# Patient Record
Sex: Male | Born: 1955
Health system: Southern US, Community
[De-identification: ages and names within clinical notes are randomized; demographics above are authoritative.]

## PROBLEM LIST (undated history)

## (undated) DIAGNOSIS — I499 Cardiac arrhythmia, unspecified: Secondary | ICD-10-CM

## (undated) DIAGNOSIS — I10 Essential (primary) hypertension: Secondary | ICD-10-CM

## (undated) DIAGNOSIS — G4733 Obstructive sleep apnea (adult) (pediatric): Secondary | ICD-10-CM

## (undated) DIAGNOSIS — Z7982 Long term (current) use of aspirin: Secondary | ICD-10-CM

## (undated) DIAGNOSIS — N138 Other obstructive and reflux uropathy: Secondary | ICD-10-CM

## (undated) DIAGNOSIS — N401 Enlarged prostate with lower urinary tract symptoms: Secondary | ICD-10-CM

## (undated) DIAGNOSIS — M109 Gout, unspecified: Secondary | ICD-10-CM

## (undated) DIAGNOSIS — M1A09X0 Idiopathic chronic gout, multiple sites, without tophus (tophi): Secondary | ICD-10-CM

## (undated) DIAGNOSIS — I480 Paroxysmal atrial fibrillation: Secondary | ICD-10-CM

## (undated) DIAGNOSIS — R351 Nocturia: Secondary | ICD-10-CM

## (undated) HISTORY — DX: Obstructive sleep apnea (adult) (pediatric): G47.33

## (undated) HISTORY — DX: Essential (primary) hypertension: I10

## (undated) HISTORY — DX: Other obstructive and reflux uropathy: N13.8

## (undated) HISTORY — DX: Idiopathic chronic gout, multiple sites, without tophus (tophi): M1A.09X0

## (undated) HISTORY — DX: Paroxysmal atrial fibrillation: I48.0

## (undated) HISTORY — DX: Benign prostatic hyperplasia with lower urinary tract symptoms: N40.1

## (undated) HISTORY — DX: Gout, unspecified: M10.9

## (undated) HISTORY — DX: Long term (current) use of aspirin: Z79.82

## (undated) HISTORY — DX: Nocturia: R35.1

## (undated) HISTORY — PX: LIPOSUCTION: SHX10

---

## 2017-03-27 DIAGNOSIS — R079 Chest pain, unspecified: Secondary | ICD-10-CM | POA: Insufficient documentation

## 2018-03-09 ENCOUNTER — Ambulatory Visit: Payer: 59 | Attending: Family Medicine | Admitting: Family Medicine

## 2018-03-09 ENCOUNTER — Encounter: Payer: Self-pay | Admitting: Family Medicine

## 2018-03-09 VITALS — BP 145/88 | HR 68 | Temp 98.1°F | Resp 18 | Ht 74.0 in | Wt 232.0 lb

## 2018-03-09 DIAGNOSIS — M1A09X0 Idiopathic chronic gout, multiple sites, without tophus (tophi): Secondary | ICD-10-CM

## 2018-03-09 DIAGNOSIS — N401 Enlarged prostate with lower urinary tract symptoms: Secondary | ICD-10-CM

## 2018-03-09 DIAGNOSIS — I480 Paroxysmal atrial fibrillation: Secondary | ICD-10-CM

## 2018-03-09 DIAGNOSIS — R350 Frequency of micturition: Secondary | ICD-10-CM

## 2018-03-09 DIAGNOSIS — H04123 Dry eye syndrome of bilateral lacrimal glands: Secondary | ICD-10-CM

## 2018-03-09 DIAGNOSIS — R82998 Other abnormal findings in urine: Secondary | ICD-10-CM

## 2018-03-09 DIAGNOSIS — R351 Nocturia: Secondary | ICD-10-CM | POA: Diagnosis not present

## 2018-03-09 DIAGNOSIS — Z7982 Long term (current) use of aspirin: Secondary | ICD-10-CM

## 2018-03-09 DIAGNOSIS — I10 Essential (primary) hypertension: Secondary | ICD-10-CM | POA: Diagnosis not present

## 2018-03-09 LAB — POCT URINALYSIS DIP (CLINITEK)
Bilirubin, UA: NEGATIVE
Blood, UA: NEGATIVE
Glucose, UA: NEGATIVE mg/dL
Ketones, POC UA: NEGATIVE mg/dL
Nitrite, UA: NEGATIVE
Spec Grav, UA: 1.02
Urobilinogen, UA: 0.2 U/dL
pH, UA: 5.5

## 2018-03-09 MED ORDER — ALLOPURINOL 300 MG PO TABS: 300.0000 mg | ORAL_TABLET | Freq: Every day | ORAL | 1 refills | Status: DC

## 2018-03-09 MED ORDER — VALSARTAN 320 MG PO TABS: 320.0000 mg | ORAL_TABLET | Freq: Every day | ORAL | 1 refills | Status: DC

## 2018-03-09 MED ORDER — SULFAMETHOXAZOLE-TRIMETHOPRIM 800-160 MG PO TABS: 1.0000 | ORAL_TABLET | Freq: Two times a day (BID) | ORAL | 0 refills | Status: AC

## 2018-03-09 MED ORDER — COLCHICINE 0.6 MG PO TABS: 0.6000 mg | ORAL_TABLET | Freq: Every day | ORAL | 1 refills | Status: DC

## 2018-03-09 MED ORDER — DILTIAZEM HCL ER COATED BEADS 300 MG PO CP24: 300.0000 mg | ORAL_CAPSULE | Freq: Every day | ORAL | 1 refills | Status: DC

## 2018-03-09 MED ORDER — METOPROLOL SUCCINATE ER 50 MG PO TB24: 50.0000 mg | ORAL_TABLET | Freq: Every day | ORAL | 1 refills | Status: DC

## 2018-03-09 MED ORDER — ASPIRIN 325 MG PO TABS: 325.0000 mg | ORAL_TABLET | Freq: Every day | ORAL | 1 refills | Status: DC

## 2018-03-09 MED ORDER — FLECAINIDE ACETATE 50 MG PO TABS: 50.0000 mg | ORAL_TABLET | Freq: Every day | ORAL | 1 refills | Status: DC

## 2018-03-09 MED ORDER — TAMSULOSIN HCL 0.4 MG PO CAPS: 0.4000 mg | ORAL_CAPSULE | Freq: Every day | ORAL | 1 refills | Status: DC

## 2018-03-09 MED FILL — TAMSULOSIN HCL 0.4 MG CAP: 0.4 | 30 days supply | Qty: 30 | Fill #0

## 2018-03-09 MED FILL — METOPROLOL SUCCINATE ER 50: 50 | 30 days supply | Qty: 30 | Fill #0

## 2018-03-09 MED FILL — ALLOPURINOL 300 MG TAB: 300 | 30 days supply | Qty: 30 | Fill #0

## 2018-03-09 MED FILL — FLECAINIDE ACETATE 50 MG TA: 50 | 30 days supply | Qty: 30 | Fill #0

## 2018-03-09 MED FILL — DIOVAN 320 MG TABLET: 320 | 30 days supply | Qty: 30 | Fill #0

## 2018-03-09 MED FILL — CARTIA XT 300 MG CAPSULE SA: 300 | 30 days supply | Qty: 30 | Fill #0

## 2018-03-09 MED FILL — SULFAMETHOXAZOLE-TMP DS TAB: 800-160 | 7 days supply | Qty: 14 | Fill #0

## 2018-03-09 NOTE — Progress Notes (Signed)
Subjective:    Patient ID: Albert Bon., male    DOB: May 28, 1955, 63 y.o.   MRN: [id number]  HPI       63 yo male who is new to the practice. Patient reports medical problems include hypertension, atrial fibrillation for which he is on ASA 325 mg-previously on Eliquis, and gout which started in the left great toe and is now in Achilles tendon, knees and left hands at times, BPH with urinary obstructive symptoms.  Patient reports that he moved to the area in January of this year.  Patient needs to reestablish with primary care and other specialists.      Patient reports that he is compliant with his blood pressure medication and has had no issues with headaches or dizziness related to his blood pressure.  He denies any chest pain, shortness of breath or palpitations related to his atrial fibrillation.  Patient has had no episodes of feeling as if he has had increased heart rate.  Patient is currently on aspirin 325 mg daily.  Patient denies any abdominal pain and has had no unusual bruising or bleeding.  Patient reports no nosebleeds, no bleeding with brushing his teeth, no blood in the urine and no rectal bleeding or blood in the stools.      He reports a history of enlarged prostate for which he is on Flomax.  Patient also reports prior testosterone therapy secondary to low testosterone level and patient states that he would like to restart testosterone as he felt that he had more energy when he was on testosterone supplementation.  He has noticed a recent increase in frequency of urination including having to urinate 3-4 times at night after he is gone to bed.  He is not quite sure how long the increase in urinary frequency has been going on but he has noticed it for at least 4 weeks.  Patient with some occasional dry mouth, increased thirst but he believes that this may be secondary to his medications and patient was also told to increase water to help with his gout.  Patient has also noticed  some changes in vision as his eyes constantly feel dry and sometimes itchy.  Patient denies blurred vision or double vision.      He denies any recent acute gout flareups.  He takes allopurinol and colchicine daily.  Patient denies any stomach upset or diarrhea associated with his use of these medications.  Patient tends to get gout in his feet at the base the great toes, left greater than right, knees, Achilles tendon areas and now in his left hand.  He knows which foods and beverages he should avoid but still occasionally drinks beer even though this is 1 of the things that can trigger his gout flareups.  Overall, patient feels that he is doing well at this time.  Past Medical History:  Diagnosis Date  . BPH with obstruction/lower urinary tract symptoms   . Essential hypertension   . Gout   . Long-term use of aspirin therapy   . OSA (obstructive sleep apnea)   . Paroxysmal atrial fibrillation [facility])    Past Surgical History:  Procedure Laterality Date  . LIPOSUCTION     Family History  Problem Relation Age of Onset  . Diabetes Mother   . Hypertension Father   . Diabetes Sister   . Diabetes Brother    Social History   Tobacco Use  . Smoking status: Former Research scientist (life sciences)  . Smokeless tobacco: Current User  Types: Chew  Substance Use Topics  . Alcohol use: Not Currently  . Drug use: Not Currently   Allergies  Allergen Reactions  . Other Hives    HAIR DYE      Review of Systems  Constitutional: Positive for fatigue (occasional ). Negative for chills and fever.  HENT: Negative for ear pain, hearing loss, nosebleeds, postnasal drip, rhinorrhea, sinus pressure, sinus pain, sore throat and trouble swallowing.   Eyes: Positive for itching and visual disturbance. Negative for photophobia, pain, discharge and redness.  Respiratory: Negative for cough and shortness of breath.   Cardiovascular: Negative for chest pain, palpitations and leg swelling.  Gastrointestinal: Negative for  abdominal pain, blood in stool, constipation, diarrhea and nausea.  Endocrine: Positive for polydipsia. Negative for polyphagia and polyuria.  Genitourinary: Positive for flank pain and frequency. Negative for dysuria.  Musculoskeletal: Positive for arthralgias (occasionally due to gout). Negative for back pain, gait problem, joint swelling, myalgias, neck pain and neck stiffness.  Neurological: Negative for dizziness and headaches.  Hematological: Negative for adenopathy. Does not bruise/bleed easily.  Psychiatric/Behavioral: Negative for sleep disturbance. The patient is not nervous/anxious.        Objective:   Physical Exam Vitals signs and nursing note reviewed.  Constitutional:      General: He is not in acute distress.    Appearance: Normal appearance.  HENT:     Head: Normocephalic and atraumatic.     Right Ear: Tympanic membrane, ear canal and external ear normal.     Left Ear: Tympanic membrane, ear canal and external ear normal.     Nose: Nose normal. No rhinorrhea.     Mouth/Throat:     Mouth: Mucous membranes are moist.     Pharynx: Oropharynx is clear. Posterior oropharyngeal erythema (mild) present. No oropharyngeal exudate.  Eyes:     Extraocular Movements: Extraocular movements intact.     Conjunctiva/sclera: Conjunctivae normal.     Pupils: Pupils are equal, round, and reactive to light.  Neck:     Musculoskeletal: Normal range of motion and neck supple. No neck rigidity or muscular tenderness.     Vascular: No carotid bruit.  Cardiovascular:     Rate and Rhythm: Normal rate and regular rhythm.  Pulmonary:     Effort: Pulmonary effort is normal.     Breath sounds: Normal breath sounds.  Abdominal:     General: Bowel sounds are normal. There is no distension.     Palpations: Abdomen is soft.     Tenderness: There is no abdominal tenderness. There is no right CVA tenderness or left CVA tenderness.  Musculoskeletal: Normal range of motion.        General: No  tenderness.     Right lower leg: No edema.     Left lower leg: No edema.     Comments: No acute gouty tophi  Lymphadenopathy:     Cervical: No cervical adenopathy.  Neurological:     General: No focal deficit present.     Mental Status: He is alert and oriented to person, place, and time.  Psychiatric:        Mood and Affect: Mood normal.        Behavior: Behavior normal.        Thought Content: Thought content normal.        Judgment: Judgment normal.    BP (!) 145/88 (BP Location: Left Arm, Patient Position: Sitting, Cuff Size: Large)   Pulse 68   Temp 98.1 F (36.7 C) (  Oral)   Resp 18   Ht 6\' 2"  (1.88 m)   Wt 232 lb (105.2 kg)   SpO2 100%   BMI 29.79 kg/m  nurse's notes and vital signs reviewed        Assessment & Plan:  1. Paroxysmal atrial fibrillation ([place]) Patient is new to the area and reports a history of paroxysmal atrial fibrillation.  Patient is provided with refills of his current flecainide and diltiazem as well as aspirin 325 mg.  Patient will be referred to establish with a cardiologist.  Patient will have BMP and CBC in follow-up of medication use for control of his paroxysmal atrial fibrillation and long-term use of aspirin. - flecainide (TAMBOCOR) 50 MG tablet; Take 1 tablet (50 mg total) by mouth daily.  Dispense: 90 tablet; Refill: 1 - diltiazem (CARTIA XT) 300 MG 24 hr capsule; Take 1 capsule (300 mg total) by mouth daily.  Dispense: 90 capsule; Refill: 1 - aspirin 325 MG tablet; Take 1 tablet (325 mg total) by mouth daily.  Dispense: 90 tablet; Refill: 1 - Ambulatory referral to Cardiology - Basic Metabolic Panel - CBC with Differential  2. Essential hypertension Patient's blood pressure was slightly above normal at today's visit.  Patient is encouraged to follow a Dash diet and begin regular low impact cardiovascular exercise.  Prescription provided for Diovan, diltiazem and metoprolol for continued treatment of hypertension.  Patient will also have  BMP in follow-up of medication use and lipid panel due to his hypertension. - valsartan (DIOVAN) 320 MG tablet; Take 1 tablet (320 mg total) by mouth daily. To control blood pressure  Dispense: 90 tablet; Refill: 1 - metoprolol succinate (TOPROL-XL) 50 MG 24 hr tablet; Take 1 tablet (50 mg total) by mouth daily. Take with or immediately following a meal.  Dispense: 90 tablet; Refill: 1 - Basic Metabolic Panel - Lipid Panel  3. BPH associated with nocturia Patient with BPH associated with nocturia/urinary frequency per patient's report.  Patient provided with refill of his Flomax and patient will have urinalysis at today's visit.  Urinalysis showed small leukocytes and urine will be sent for culture.  Patient prescribed Bactrim DS in the interim.  Referral also placed to urology in follow-up of patient's BPH.  Patient stated that he had been on testosterone in the past and requested testosterone at today's visit.  Patient did not have any proof of prior testosterone use and I discussed with the patient that testosterone can increase risk of prostate cancer and patient was asked to follow-up with urology regarding his wish to be on testosterone therapy as well as patient with complaints of increased urinary frequency. - tamsulosin (FLOMAX) 0.4 MG CAPS capsule; Take 1 capsule (0.4 mg total) by mouth at bedtime.  Dispense: 90 capsule; Refill: 1 - Ambulatory referral to Urology - POCT URINALYSIS DIP (CLINITEK) - Urine Culture  4. Chronic gout of multiple sites, unspecified cause Patient reports chronic gout at multiple joints but does not have any current acute flareups.  Refills provided of allopurinol and colchicine and patient will have BMP to check renal function and uric acid level to see if his current therapy is effective.  Patient is encouraged to remain hydrated and to avoid foods/beverages which tend to trigger his gout symptoms. - allopurinol (ZYLOPRIM) 300 MG tablet; Take 1 tablet (300 mg  total) by mouth daily. To treat gout  Dispense: 90 tablet; Refill: 1 - colchicine 0.6 MG tablet; Take 1 tablet (0.6 mg total) by mouth daily. To treat gout  Dispense: 90 tablet; Refill: 1 - Basic Metabolic Panel - Uric Acid  5. Urinary frequency Patient with complaint of recent onset of urinary frequency.  Patient is being referred to urology for further evaluation as patient also reports a history of BPH as well as low testosterone.  Patient will also have glucose level done as part of BMP.  Patient will have urinalysis at today's visit in follow-up.  Patient's urinalysis showed leukocytes and urine will be sent for culture. - Ambulatory referral to Urology - POCT URINALYSIS DIP (CLINITEK) - Urine Culture  6. Dry eyes Patient with complaint of a change in vision including recent issues with dry eyes.  Patient will be referred to ophthalmology for further evaluation - Ambulatory referral to Ophthalmology  7. Long-term use of aspirin therapy Patient with long-term use of aspirin therapy due to his atrial fibrillation.  Patient will have BMP to check renal function as well as CBC to look for anemia or platelet disorder related to aspirin use. - Basic Metabolic Panel - CBC with Differential  8. Leukocytes in urine Patient with leukocytes in the urine and complaint of recent increase in urinary frequency.  Urine will be sent for culture and in the interim patient has been placed on Septra double strength twice daily x7 days for treatment of acute cystitis. - sulfamethoxazole-trimethoprim (BACTRIM DS,SEPTRA DS) 800-160 MG tablet; Take 1 tablet by mouth 2 (two) times daily for 7 days.  Dispense: 14 tablet; Refill: 0 - Urine Culture  An After Visit Summary was printed and given to the patient. Allergies as of 03/09/2018      Reactions   Other Hives   HAIR DYE      Medication List       Accurate as of March 09, 2018 11:59 PM. Always use your most recent med list.        allopurinol  300 MG tablet Commonly known as:  ZYLOPRIM Take 1 tablet (300 mg total) by mouth daily. To treat gout   aspirin 325 MG tablet Take 1 tablet (325 mg total) by mouth daily.   colchicine 0.6 MG tablet Take 1 tablet (0.6 mg total) by mouth daily. To treat gout   diltiazem 300 MG 24 hr capsule Commonly known as:  CARTIA XT Take 1 capsule (300 mg total) by mouth daily.   flecainide 50 MG tablet Commonly known as:  TAMBOCOR Take 1 tablet (50 mg total) by mouth daily.   metoprolol succinate 50 MG 24 hr tablet Commonly known as:  TOPROL-XL Take 1 tablet (50 mg total) by mouth daily. Take with or immediately following a meal.   sulfamethoxazole-trimethoprim 800-160 MG tablet Commonly known as:  BACTRIM DS,SEPTRA DS Take 1 tablet by mouth 2 (two) times daily for 7 days.   tamsulosin 0.4 MG Caps capsule Commonly known as:  FLOMAX Take 1 capsule (0.4 mg total) by mouth at bedtime.   valsartan 320 MG tablet Commonly known as:  DIOVAN Take 1 tablet (320 mg total) by mouth daily. To control blood pressure      Return in about 4 months (around 07/08/2018) for HTN/Afib.

## 2018-03-10 LAB — CBC WITH DIFFERENTIAL/PLATELET
Basophils Absolute: 0.1 x10E3/uL (ref 0.0–0.2)
Basos: 1 %
EOS (ABSOLUTE): 0.2 x10E3/uL (ref 0.0–0.4)
Eos: 4 %
Hematocrit: 45.9 % (ref 37.5–51.0)
Hemoglobin: 15 g/dL (ref 13.0–17.7)
Immature Grans (Abs): 0 x10E3/uL (ref 0.0–0.1)
Immature Granulocytes: 0 %
Lymphocytes Absolute: 2.5 x10E3/uL (ref 0.7–3.1)
Lymphs: 44 %
MCH: 27.2 pg (ref 26.6–33.0)
MCHC: 32.7 g/dL (ref 31.5–35.7)
MCV: 83 fL (ref 79–97)
Monocytes Absolute: 0.5 x10E3/uL (ref 0.1–0.9)
Monocytes: 9 %
Neutrophils Absolute: 2.4 x10E3/uL (ref 1.4–7.0)
Neutrophils: 42 %
Platelets: 298 x10E3/uL (ref 150–450)
RBC: 5.51 x10E6/uL (ref 4.14–5.80)
RDW: 14 % (ref 11.6–15.4)
WBC: 5.8 x10E3/uL (ref 3.4–10.8)

## 2018-03-10 LAB — LIPID PANEL
Chol/HDL Ratio: 2.9 ratio (ref 0.0–5.0)
Cholesterol, Total: 183 mg/dL (ref 100–199)
HDL: 64 mg/dL
LDL Calculated: 104 mg/dL — ABNORMAL HIGH (ref 0–99)
Triglycerides: 74 mg/dL (ref 0–149)
VLDL Cholesterol Cal: 15 mg/dL (ref 5–40)

## 2018-03-10 LAB — BASIC METABOLIC PANEL WITH GFR
BUN/Creatinine Ratio: 10 (ref 10–24)
BUN: 11 mg/dL (ref 8–27)
CO2: 23 mmol/L (ref 20–29)
Calcium: 9.3 mg/dL (ref 8.6–10.2)
Chloride: 106 mmol/L (ref 96–106)
Creatinine, Ser: 1.08 mg/dL (ref 0.76–1.27)
GFR calc Af Amer: 83 mL/min/1.73
GFR calc non Af Amer: 72 mL/min/1.73
Glucose: 97 mg/dL (ref 65–99)
Potassium: 4.5 mmol/L (ref 3.5–5.2)
Sodium: 144 mmol/L (ref 134–144)

## 2018-03-10 LAB — URIC ACID: Uric Acid: 4 mg/dL (ref 3.7–8.6)

## 2018-03-11 LAB — URINE CULTURE: Organism ID, Bacteria: NO GROWTH

## 2018-03-12 ENCOUNTER — Encounter: Payer: Self-pay | Admitting: Family Medicine

## 2018-03-12 DIAGNOSIS — Z7982 Long term (current) use of aspirin: Secondary | ICD-10-CM | POA: Insufficient documentation

## 2018-03-12 DIAGNOSIS — M1A09X0 Idiopathic chronic gout, multiple sites, without tophus (tophi): Secondary | ICD-10-CM

## 2018-03-12 DIAGNOSIS — N401 Enlarged prostate with lower urinary tract symptoms: Secondary | ICD-10-CM

## 2018-03-12 DIAGNOSIS — R351 Nocturia: Secondary | ICD-10-CM

## 2018-03-12 DIAGNOSIS — I480 Paroxysmal atrial fibrillation: Secondary | ICD-10-CM | POA: Insufficient documentation

## 2018-03-12 DIAGNOSIS — I10 Essential (primary) hypertension: Secondary | ICD-10-CM | POA: Insufficient documentation

## 2018-03-12 HISTORY — DX: Nocturia: R35.1

## 2018-03-12 HISTORY — DX: Idiopathic chronic gout, multiple sites, without tophus (tophi): M1A.09X0

## 2018-03-12 HISTORY — DX: Long term (current) use of aspirin: Z79.82

## 2018-03-12 HISTORY — DX: Benign prostatic hyperplasia with lower urinary tract symptoms: N40.1

## 2018-03-14 ENCOUNTER — Telehealth: Payer: Self-pay | Admitting: *Deleted

## 2018-03-14 NOTE — Telephone Encounter (Signed)
Patient verified DOB Patient is aware of no growth being noted on urine culture. Patient was provided the contact information for urology and ophthalmology. No further questions.

## 2018-03-14 NOTE — Telephone Encounter (Signed)
-----   Message from Antony Blackbird, MD sent at 03/12/2018 11:44 AM EST ----- Please notify patient that his urine culture showed no growth of bacteria. Please keep follow-up appointment with GI

## 2018-03-22 ENCOUNTER — Encounter: Payer: Self-pay | Admitting: *Deleted

## 2018-03-29 ENCOUNTER — Telehealth: Payer: Self-pay | Admitting: Cardiology

## 2018-03-29 NOTE — Telephone Encounter (Signed)
Called patient to reschedule his appointment for 03/30/2018 due to the coronavirus pandemic.  Patient is referred to establish new cardiac care as he has moved here from another state in January.  He has a history of paroxysmal atrial fibrillation and was followed by a cardiologist at his last place of residence.  In talking to him today he is doing fine and not having any problems.  He is fine with being re-scheduled to a later date.  Please reschedule his appointment for at least 2 months out.

## 2018-03-30 ENCOUNTER — Ambulatory Visit: Payer: 59 | Admitting: Cardiology

## 2018-04-12 MED FILL — FLECAINIDE ACETATE 50 MG TA: 50 | 30 days supply | Qty: 30 | Fill #1

## 2018-04-12 MED FILL — ALLOPURINOL 300 MG TAB: 300 | 30 days supply | Qty: 30 | Fill #1

## 2018-04-12 MED FILL — COLCHICINE 0.6 MG TABS: 0.6 | 30 days supply | Qty: 30 | Fill #0

## 2018-04-12 MED FILL — METOPROLOL SUCCINATE ER 50: 50 | 30 days supply | Qty: 30 | Fill #1

## 2018-04-12 MED FILL — CARTIA XT 300 MG CAPSULE SA: 300 | 30 days supply | Qty: 30 | Fill #1

## 2018-04-12 MED FILL — TAMSULOSIN HCL 0.4 MG CAP: 0.4 | 30 days supply | Qty: 30 | Fill #1

## 2018-04-19 ENCOUNTER — Other Ambulatory Visit: Payer: Self-pay | Admitting: Pharmacist

## 2018-04-19 DIAGNOSIS — I10 Essential (primary) hypertension: Secondary | ICD-10-CM

## 2018-04-19 MED ORDER — VALSARTAN 320 MG PO TABS: 320.0000 mg | ORAL_TABLET | Freq: Every day | ORAL | 0 refills | Status: DC

## 2018-04-20 MED FILL — VALSARTAN 320 MG TAB: 320 | 30 days supply | Qty: 30 | Fill #0

## 2018-05-04 ENCOUNTER — Telehealth: Payer: Self-pay

## 2018-05-04 NOTE — Telephone Encounter (Signed)
Virtual Visit Pre-Appointment Phone Call  "(Name), I am calling you today to discuss your upcoming appointment. We are currently trying to limit exposure to the virus that causes COVID-19 by seeing patients at home rather than in the office."  1. "What is the BEST phone number to call the Good of the visit?" - include this in appointment notes  2. "Do you have or have access to (through a family member/friend) a smartphone with video capability that we can use for your visit?" a. If yes - list this number in appt notes as "cell" (if different from BEST phone #) and list the appointment type as a VIDEO visit in appointment notes b. If no - list the appointment type as a PHONE visit in appointment notes  3. Confirm consent - "In the setting of the current Covid19 crisis, you are scheduled for a (phone or video) visit with your provider on (date) at (time).  Just as we do with many in-office visits, in order for you to participate in this visit, we must obtain consent.  If you'd like, I can send this to your mychart (if signed up) or email for you to review.  Otherwise, I can obtain your verbal consent now.  All virtual visits are billed to your insurance company just like a normal visit would be.  By agreeing to a virtual visit, we'd like you to understand that the technology does not allow for your provider to perform an examination, and thus may limit your provider's ability to fully assess your condition. If your provider identifies any concerns that need to be evaluated in person, we will make arrangements to do so.  Finally, though the technology is pretty good, we cannot assure that it will always work on either your or our end, and in the setting of a video visit, we may have to convert it to a phone-only visit.  In either situation, we cannot ensure that we have a secure connection.  Are you willing to proceed?" STAFF: Did the patient verbally acknowledge consent to telehealth visit? Document  YES/NO here: YES  4. Advise patient to be prepared - "Two hours prior to your appointment, go ahead and check your blood pressure, pulse, oxygen saturation, and your weight (if you have the equipment to check those) and write them all down. When your visit starts, your provider will ask you for this information. If you have an Apple Watch or Kardia device, please plan to have heart rate information ready on the Good of your appointment. Please have a pen and paper handy nearby the Good of the visit as well."  5. Give patient instructions for MyChart download to smartphone OR Doximity/Doxy.me as below if video visit (depending on what platform provider is using)  6. Inform patient they will receive a phone call 15 minutes prior to their appointment time (may be from unknown caller ID) so they should be prepared to answer    Albert Good. has been deemed a candidate for a follow-up tele-health visit to limit community exposure during the Covid-19 pandemic. I spoke with the patient via phone to ensure availability of phone/video source, confirm preferred email & phone number, and discuss instructions and expectations.  I reminded Albert Good. to be prepared with any vital sign and/or heart rhythm information that could potentially be obtained via home monitoring, at the time of his visit. I reminded Albert Good. to expect a phone call prior to  his visit.  Mady Haagensen, [place] 05/04/2018 5:06 PM   INSTRUCTIONS FOR DOWNLOADING THE MYCHART APP TO SMARTPHONE  - The patient must first make sure to have activated MyChart and know their login information - If Apple, go to [employer] and type in MyChart in the search bar and download the app. If Android, ask patient to go to [employer] and type in [place] in the search bar and download the app. The app is free but as with any other app downloads, their phone may require them to verify saved payment information or  Apple/Android password.  - The patient will need to then log into the app with their MyChart username and password, and select  as their healthcare provider to link the account. When it is time for your visit, go to the MyChart app, find appointments, and click Begin Video Visit. Be sure to Select Allow for your device to access the Microphone and Camera for your visit. You will then be connected, and your provider will be with you shortly.  **If they have any issues connecting, or need assistance please contact MyChart service desk [phone])**  **If using a computer, in order to ensure the best quality for their visit they will need to use either of the following Internet Browsers: [employer], or Google Chrome**  IF USING DOXIMITY or DOXY.ME - The patient will receive a link just prior to their visit by text.     FULL LENGTH CONSENT FOR TELE-HEALTH VISIT   I hereby voluntarily request, consent and authorize [facility] and its employed or contracted physicians, physician assistants, nurse practitioners or other licensed health care professionals (the Practitioner), to provide me with telemedicine health care services (the "Services") as deemed necessary by the treating Practitioner. I acknowledge and consent to receive the Services by the Practitioner via telemedicine. I understand that the telemedicine visit will involve communicating with the Practitioner through live audiovisual communication technology and the disclosure of certain medical information by electronic transmission. I acknowledge that I have been given the opportunity to request an in-person assessment or other available alternative prior to the telemedicine visit and am voluntarily participating in the telemedicine visit.  I understand that I have the right to withhold or withdraw my consent to the use of telemedicine in the course of my care at any time, without affecting my right to future care  or treatment, and that the Practitioner or I may terminate the telemedicine visit at any time. I understand that I have the right to inspect all information obtained and/or recorded in the course of the telemedicine visit and may receive copies of available information for a reasonable fee.  I understand that some of the potential risks of receiving the Services via telemedicine include:  Marland Kitchen Delay or interruption in medical evaluation due to technological equipment failure or disruption; . Information transmitted may not be sufficient (e.g. poor resolution of images) to allow for appropriate medical decision making by the Practitioner; and/or  . In rare instances, security protocols could fail, causing a breach of personal health information.  Furthermore, I acknowledge that it is my responsibility to provide information about my medical history, conditions and care that is complete and accurate to the best of my ability. I acknowledge that Practitioner's advice, recommendations, and/or decision may be based on factors not within their control, such as incomplete or inaccurate data provided by me or distortions of diagnostic images or specimens that may result from electronic transmissions.  I understand that the practice of medicine is not an exact science and that Practitioner makes no warranties or guarantees regarding treatment outcomes. I acknowledge that I will receive a copy of this consent concurrently upon execution via email to the email address I last provided but may also request a printed copy by calling the office of [facility].    I understand that my insurance will be billed for this visit.   I have read or had this consent read to me. . I understand the contents of this consent, which adequately explains the benefits and risks of the Services being provided via telemedicine.  . I have been provided ample opportunity to ask questions regarding this consent and the Services and have had  my questions answered to my satisfaction. . I give my informed consent for the services to be provided through the use of telemedicine in my medical care  By participating in this telemedicine visit I agree to the above.

## 2018-05-05 ENCOUNTER — Other Ambulatory Visit: Payer: Self-pay | Admitting: Pharmacist

## 2018-05-05 ENCOUNTER — Other Ambulatory Visit: Payer: Self-pay

## 2018-05-05 ENCOUNTER — Telehealth (INDEPENDENT_AMBULATORY_CARE_PROVIDER_SITE_OTHER): Payer: 59 | Admitting: Internal Medicine

## 2018-05-05 ENCOUNTER — Encounter: Payer: Self-pay | Admitting: Internal Medicine

## 2018-05-05 DIAGNOSIS — N401 Enlarged prostate with lower urinary tract symptoms: Secondary | ICD-10-CM

## 2018-05-05 DIAGNOSIS — I480 Paroxysmal atrial fibrillation: Secondary | ICD-10-CM | POA: Diagnosis not present

## 2018-05-05 DIAGNOSIS — R351 Nocturia: Secondary | ICD-10-CM

## 2018-05-05 DIAGNOSIS — I10 Essential (primary) hypertension: Secondary | ICD-10-CM

## 2018-05-05 MED ORDER — METOPROLOL SUCCINATE ER 100 MG PO TB24: 100.0000 mg | ORAL_TABLET | Freq: Every day | ORAL | 1 refills | Status: DC

## 2018-05-05 MED ORDER — DILTIAZEM HCL ER COATED BEADS 300 MG PO CP24: 300.0000 mg | ORAL_CAPSULE | Freq: Every day | ORAL | 3 refills | Status: DC

## 2018-05-05 MED ORDER — ASPIRIN 325 MG PO TABS: 325.0000 mg | ORAL_TABLET | Freq: Every day | ORAL | 3 refills | Status: DC

## 2018-05-05 MED ORDER — FLECAINIDE ACETATE 50 MG PO TABS: 50.0000 mg | ORAL_TABLET | Freq: Every day | ORAL | 3 refills | Status: DC

## 2018-05-05 MED ORDER — VALSARTAN 320 MG PO TABS: 320.0000 mg | ORAL_TABLET | Freq: Every day | ORAL | 3 refills | Status: DC

## 2018-05-05 MED ORDER — METOPROLOL SUCCINATE ER 50 MG PO TB24: 100.0000 mg | ORAL_TABLET | Freq: Every day | ORAL | 3 refills | Status: DC

## 2018-05-05 MED FILL — METOPROLOL SUCCINATE ER 100: 100 | 30 days supply | Qty: 30 | Fill #0

## 2018-05-05 MED FILL — FLECAINIDE ACETATE 50 MG TA: 50 | 30 days supply | Qty: 30 | Fill #0

## 2018-05-05 MED FILL — CARTIA XT 300 MG CAPSULE SA: 300 | 30 days supply | Qty: 30 | Fill #0

## 2018-05-05 NOTE — Progress Notes (Signed)
Virtual Visit via Video Note   This visit type was conducted due to national recommendations for restrictions regarding the COVID-19 Pandemic (e.g. social distancing) in an effort to limit this patient's exposure and mitigate transmission in our community.  Due to his co-morbid illnesses, this patient is at least at moderate risk for complications without adequate follow up.  This format is felt to be most appropriate for this patient at this time.  All issues noted in this document were discussed and addressed.  A limited physical exam was performed with this format.  Please refer to the patient's chart for his consent to telehealth for [facility].   Evaluation Performed:  New vist Date:  05/05/2018   ID:  Albert Good., DOB 1955/03/30, MRN [id number]  Patient Location: Home Provider Location: Home  PCP:  Antony Blackbird, MD  Cardiologist:  New   Electrophysiologist:  None   Chief Complaint:  Pt referred by Dr Chapman Fitch to establish   Hx of PAF  History of Present Illness:    Albert Good. is a 63 y.o. male with hx of PAF  On flecanide and dilt and ASA   Also a history of HTN   The pt was followed by Dr Sammuel Cooper at [facility] in [place]  Cardioverted 2-3x   Started flecanide     2 to 3 times per year will have fluttering  Had appt to see him 2 wks ago    Last seen 5 months Has had monitor and ultrasounds   Every time wore monitor did not show anything  BP has been sky high.   2 to 3 times per year heart will start jumping around   BP up at this time   BP now 177/105  HR 105   When HR is not jumping around HR is 60 to 70s   He is active   Works out  Denies [employer] is OK most of time even with activity   No dizziness   No edema  No PND   The patient does not have symptoms concerning for COVID-19 infection (fever, chills, cough, or new shortness of breath).    Past Medical History:  Diagnosis Date  . BPH associated with nocturia 03/12/2018  . BPH with  obstruction/lower urinary tract symptoms   . Chronic gout of multiple sites 03/12/2018  . Essential hypertension   . Gout   . Long-term use of aspirin therapy 03/12/2018  . OSA (obstructive sleep apnea)   . Paroxysmal atrial fibrillation [facility])    Past Surgical History:  Procedure Laterality Date  . LIPOSUCTION       Current Meds  Medication Sig  . allopurinol (ZYLOPRIM) 300 MG tablet Take 1 tablet (300 mg total) by mouth daily. To treat gout  . aspirin 325 MG tablet Take 1 tablet (325 mg total) by mouth daily.  . colchicine 0.6 MG tablet Take 1 tablet (0.6 mg total) by mouth daily. To treat gout  . diltiazem (CARTIA XT) 300 MG 24 hr capsule Take 1 capsule (300 mg total) by mouth daily.  . flecainide (TAMBOCOR) 50 MG tablet Take 1 tablet (50 mg total) by mouth daily.  . metoprolol succinate (TOPROL-XL) 50 MG 24 hr tablet Take 1 tablet (50 mg total) by mouth daily. Take with or immediately following a meal.  . tamsulosin (FLOMAX) 0.4 MG CAPS capsule Take 1 capsule (0.4 mg total) by mouth at bedtime.  . valsartan (DIOVAN) 320 MG tablet Take 1 tablet (  320 mg total) by mouth daily. To control blood pressure     Allergies:   Other   Social History   Tobacco Use  . Smoking status: Former Research scientist (life sciences)  . Smokeless tobacco: Current User    Types: Chew  Substance Use Topics  . Alcohol use: Not Currently  . Drug use: Not Currently     Family Hx: The patient's family history includes Diabetes in his brother, mother, and sister; Hypertension in his father.  ROS:   Please see the history of present illness.     All other systems reviewed and are negative.   Prior CV studies:   The following studies were reviewed today:    Labs/Other Tests and Data Reviewed:    EKG:  No ECG reviewed.  Recent Labs: 03/09/2018: BUN 11; Creatinine, Ser 1.08; Hemoglobin 15.0; Platelets 298; Potassium 4.5; Sodium 144   Recent Lipid Panel Lab Results  Component Value Date/Time   CHOL 183 03/09/2018  11:26 AM   TRIG 74 03/09/2018 11:26 AM   HDL 64 03/09/2018 11:26 AM   CHOLHDL 2.9 03/09/2018 11:26 AM   LDLCALC 104 (H) 03/09/2018 11:26 AM    Wt Readings from Last 3 Encounters:  05/05/18 225 lb (102.1 kg)  03/09/18 232 lb (105.2 kg)     Objective:    Vital Signs:  BP (!) 201/107   Pulse 90   Ht 6\' 2"  (1.88 m)   Wt 225 lb (102.1 kg)   BMI 28.89 kg/m    Physcial exam is not done as virtual visit  ASSESSMENT & PLAN:    1. PAF Pt has had for at least 8 years     Says he has palpitations a couple times per year   Knows it because BP is up, he says hands shake  Denies dizziness   Breathing is a little short with this but not at other times Discussed getting Kardia device   He does not have a computer   Has smart phone   SHould be able to use   $99.    Says he has worn monitor in past but doesn't catch  Will get records from [place] in [place]     Keep on current mds   His CHADSVASc is 1   Discussed at 49 increases and would switch to NOAC   He had been on in past (Eliquis)  Switched to ASA a couple years ago   Will review   Email:   [email or web address]   2  HTN  BP is out of control   Will review outside records Increase toprol XL to double   Told him to check BP and HR over this week   Nurse would call to review  3  HCM  Pt says he is active   4  COVID-19 Education: The signs and symptoms of COVID-19 were discussed with the patient and how to seek care for testing (follow up with PCP or arrange E-visit).  The importance of social distancing was discussed today.  Time:   Today, I have spent 30 minutes with the patient with telehealth technology discussing the above problems.     Medication Adjustments/Labs and Tests Ordered: Current medicines are reviewed at length with the patient today.  Concerns regarding medicines are outlined above.   Tests Ordered: No orders of the defined types were placed in this encounter.   Medication Changes: No orders of the defined  types were placed in this encounter.   Disposition:  Follow up in  the fall  Signed, Dorris Carnes, MD  05/05/2018 10:30 AM    [place]

## 2018-05-05 NOTE — Patient Instructions (Signed)
Medication Instructions:  Your physician has recommended you make the following change in your medication:  1.) increase metoprolol succinate (Toprol XL) to 2 tablets --100 mg once a day for blood pressure If you need a refill on your cardiac medications before your next appointment, please call your pharmacy.   Lab work: none If you have labs (blood work) drawn today and your tests are completely normal, you will receive your results only by: Marland Kitchen MyChart Message (if you have MyChart) OR . A paper copy in the mail If you have any lab test that is abnormal or we need to change your treatment, we will call you to review the results.  Testing/Procedures: none  . Follow-Up: . Follow up with your physician will depend on test results.   Any Other Special Instructions Will Be Listed Below (If Applicable). Will have you sign release of information or give verbal consent and will obtain records from [facility] in [place], Dr. Sammuel Cooper. Please keep track of your blood pressure and heart rate.  Record it daily.  We will call you at the end of next week to get update on your blood pressure/heart rate readings.

## 2018-05-05 NOTE — Progress Notes (Signed)
Patient was written for 100 mg of Toprol XL to be taken as two tablets of the 50 mg by his Cardiologist. Will rewrite as 100 mg, 1 tablet daily d/t insurance preference. [facility] pharmacy has been notified that this is the same dose as was written by the Cardilogist. Additionally, he has been counseled that this is a dose increase from previously.

## 2018-05-08 ENCOUNTER — Telehealth: Payer: Self-pay | Admitting: Internal Medicine

## 2018-05-08 NOTE — Telephone Encounter (Signed)
Patient had virtual visit with Dr. Harrington Challenger on 05/05/18. Was instructed to double Toprol XL to 100 mg daily.  Working on getting records from Dr. Sammuel Cooper in [facility] in [place].

## 2018-05-08 NOTE — Telephone Encounter (Signed)
Spoke with patient, he denies dizziness. He clarified that he gets dizzy/lightheaded sometimes when he is active, and heart starts thumping around in his chest.  Currently not dizzy.  BP is improving, the 159/95 reading below is from last night.  Comes down more later in day. He has not taken metoprolol succinate as 100 mg total.  Yesterday split up.  Today only took 50 mg so far because his HR is 64 and he worries it will get too low.  Adv to go ahead and take other 50 mg tablet now.  Try to take both together.  Aware I sent him a 100 mg tablet to pharmacy.  Adv to check BP and HR this afternoon and call back with readings and how he is feeling around 2pm. He is in agreement with this plan.

## 2018-05-08 NOTE — Telephone Encounter (Signed)
New Message  Patient calling back to report BP which is 140/91.  Patient states he's feeling ok now and he will call back if it goes back up in the morning.

## 2018-05-08 NOTE — Telephone Encounter (Signed)
BP improved.  Will call him back at end of week as planned to follow up.

## 2018-05-08 NOTE — Telephone Encounter (Signed)
Pt c/o BP issue: STAT if pt c/o blurred vision, one-sided weakness or slurred speech  1. What are your last 5 BP readings? 163/101      169/103      160/102      161/99      159/95  2. Are you having any other symptoms (ex. Dizziness, headache, blurred vision, passed out)? dizziness  3. What is your BP issue? Wants to know what he needs to do.

## 2018-05-17 ENCOUNTER — Telehealth: Payer: Self-pay | Admitting: Internal Medicine

## 2018-05-17 MED FILL — ALLOPURINOL 300 MG TAB: 300 | 30 days supply | Qty: 30 | Fill #2

## 2018-05-17 MED FILL — COLCHICINE 0.6 MG TABS: 0.6 | 30 days supply | Qty: 30 | Fill #1

## 2018-05-17 MED FILL — TAMSULOSIN HCL 0.4 MG CAP: 0.4 | 30 days supply | Qty: 30 | Fill #2

## 2018-05-17 NOTE — Telephone Encounter (Signed)
New Message    Pt c/o medication issue:  1. Name of Medication Metoprolol   2. How are you currently taking this medication (dosage and times per day)? Pt is taking 100mg  daily   3. Are you having a reaction (difficulty breathing--STAT)? No   4. What is your medication issue? He doesn't know how much of the medication he is suppose to take. He said he received a letter in the mail and it says to take 200mg 

## 2018-05-17 NOTE — Telephone Encounter (Signed)
Spoke with patient.  After receiving AVS he wanted to confirm he is taking correct dose of Toprol XL. Confirmed correct dose is 100 mg once a day. Pt thanked me for returning call.

## 2018-05-18 MED FILL — VALSARTAN 320 MG TAB: 320 | 30 days supply | Qty: 30 | Fill #1

## 2018-06-15 MED FILL — COLCHICINE 0.6 MG TABS: 0.6 | 30 days supply | Qty: 30 | Fill #2

## 2018-06-15 MED FILL — TAMSULOSIN HCL 0.4 MG CAP: 0.4 | 30 days supply | Qty: 30 | Fill #3

## 2018-06-15 MED FILL — VALSARTAN 320 MG TAB: 320 | 30 days supply | Qty: 30 | Fill #2

## 2018-06-15 MED FILL — DILTIAZEM 24HR ER 300 MG CA: 300 | 30 days supply | Qty: 30 | Fill #1

## 2018-06-15 MED FILL — ALLOPURINOL 300 MG TAB: 300 | 30 days supply | Qty: 30 | Fill #3

## 2018-07-03 MED FILL — METOPROLOL SUCCINATE ER 100: 100 | 30 days supply | Qty: 30 | Fill #1

## 2018-07-12 ENCOUNTER — Ambulatory Visit: Payer: 59 | Admitting: Family Medicine

## 2018-07-13 ENCOUNTER — Encounter: Payer: Self-pay | Admitting: Family Medicine

## 2018-07-13 ENCOUNTER — Other Ambulatory Visit: Payer: Self-pay

## 2018-07-13 ENCOUNTER — Ambulatory Visit: Payer: 59 | Attending: Family Medicine | Admitting: Family Medicine

## 2018-07-13 VITALS — BP 182/110 | HR 86 | Temp 98.6°F | Ht 74.0 in | Wt 229.0 lb

## 2018-07-13 DIAGNOSIS — I160 Hypertensive urgency: Secondary | ICD-10-CM

## 2018-07-13 DIAGNOSIS — N401 Enlarged prostate with lower urinary tract symptoms: Secondary | ICD-10-CM

## 2018-07-13 DIAGNOSIS — Z8669 Personal history of other diseases of the nervous system and sense organs: Secondary | ICD-10-CM

## 2018-07-13 DIAGNOSIS — R002 Palpitations: Secondary | ICD-10-CM | POA: Diagnosis present

## 2018-07-13 DIAGNOSIS — R9431 Abnormal electrocardiogram [ECG] [EKG]: Secondary | ICD-10-CM | POA: Diagnosis not present

## 2018-07-13 DIAGNOSIS — I480 Paroxysmal atrial fibrillation: Secondary | ICD-10-CM

## 2018-07-13 DIAGNOSIS — I10 Essential (primary) hypertension: Secondary | ICD-10-CM

## 2018-07-13 DIAGNOSIS — Z7982 Long term (current) use of aspirin: Secondary | ICD-10-CM

## 2018-07-13 DIAGNOSIS — M1A09X0 Idiopathic chronic gout, multiple sites, without tophus (tophi): Secondary | ICD-10-CM

## 2018-07-13 DIAGNOSIS — R351 Nocturia: Secondary | ICD-10-CM

## 2018-07-13 MED ORDER — ASPIRIN 325 MG PO TABS: 325.0000 mg | ORAL_TABLET | Freq: Every day | ORAL | 3 refills | Status: DC

## 2018-07-13 MED ORDER — DILTIAZEM HCL ER COATED BEADS 300 MG PO CP24: 300.0000 mg | ORAL_CAPSULE | Freq: Every day | ORAL | 1 refills | Status: DC

## 2018-07-13 MED ORDER — COLCHICINE 0.6 MG PO TABS: 0.6000 mg | ORAL_TABLET | Freq: Every day | ORAL | 1 refills | Status: DC

## 2018-07-13 MED ORDER — METOPROLOL SUCCINATE ER 100 MG PO TB24: 100.0000 mg | ORAL_TABLET | Freq: Every day | ORAL | 1 refills | Status: DC

## 2018-07-13 MED ORDER — TAMSULOSIN HCL 0.4 MG PO CAPS: 0.4000 mg | ORAL_CAPSULE | Freq: Every day | ORAL | 1 refills | Status: DC

## 2018-07-13 MED ORDER — VALSARTAN 320 MG PO TABS: 320.0000 mg | ORAL_TABLET | Freq: Every day | ORAL | 1 refills | Status: DC

## 2018-07-13 MED ORDER — FLECAINIDE ACETATE 50 MG PO TABS: 50.0000 mg | ORAL_TABLET | Freq: Every day | ORAL | 3 refills | Status: DC

## 2018-07-13 MED ORDER — ALLOPURINOL 300 MG PO TABS: 300.0000 mg | ORAL_TABLET | Freq: Every day | ORAL | 1 refills | Status: DC

## 2018-07-13 MED FILL — COLCHICINE 0.6 MG TABS: 0.6 | 30 days supply | Qty: 30 | Fill #0

## 2018-07-13 MED FILL — ALLOPURINOL 300 MG TAB: 300 | 30 days supply | Qty: 30 | Fill #0

## 2018-07-13 MED FILL — CARTIA XT 300 MG CAPSULE SA: 300 | 30 days supply | Qty: 30 | Fill #0

## 2018-07-13 MED FILL — TAMSULOSIN HCL 0.4 MG CAP: 0.4 | 30 days supply | Qty: 30 | Fill #0

## 2018-07-13 MED FILL — FLECAINIDE ACETATE 50 MG TA: 50 | 30 days supply | Qty: 30 | Fill #0

## 2018-07-13 NOTE — Progress Notes (Signed)
Established Patient Office Visit  Subjective:  Patient ID: Albert Bounds., male    DOB: April 02, 1955  Age: 63 y.o. MRN: [id number]  CC:  Chief Complaint  Patient presents with  . Hypertension    HPI [facility]. presents for follow-up of hypertension and patient reports that he has been having palpitations.  He feels that whenever his blood pressure is elevated he will also have sensation  of abnormal heart rhythm.  He has a history of atrial fibrillation.  He does not feel as if he is having any fast or rapid heartbeat.  Patient also states that he has a history of sleep apnea but moved from [place] prior to getting his CPAP machine and therefore has not been on CPAP.  He feels that he stays very active throughout the day and therefore does not have time to get sleepy during the day.  He does get occasional morning headaches.  Patient states that he has been told that he snores.  He would like to have a CPAP machine or repeat sleep study so that he can obtain a CPAP machine as he believes that this may be part of why he is not feeling well.  He reports that he did take his blood pressure medication about 30 minutes prior to today's visit.  He reports that he just returned from [place] last night as his younger brother passed away from heart failure and the funeral was yesterday.      Patient reports that sometimes when he is active he will get occasional sharp, brief pain in his left chest area.  He does not feel as he he has nausea during this time.  He does not get any radiation of discomfort to the neck, jaw or left arm.  He continues to take aspirin secondary to his atrial fibrillation.  He has had no unusual bruising or bleeding.  He also continues to take allopurinol for gout.  He has had no recent increase in joint pain or swelling.  He also reports BPH and states that usually the Flomax works well but recently he has had a mild increase and difficulty initiating urinary  stream.  He denies any dysuria and no sensation of incomplete bladder emptying.  He states that the difficulty with urination usually does not last for very long and then his ability to urinate returns to normal.  He also reports the need for medication refills.  Past Medical History:  Diagnosis Date  . BPH associated with nocturia 03/12/2018  . BPH with obstruction/lower urinary tract symptoms   . Chronic gout of multiple sites 03/12/2018  . Essential hypertension   . Gout   . Long-term use of aspirin therapy 03/12/2018  . OSA (obstructive sleep apnea)   . Paroxysmal atrial fibrillation [facility])     Past Surgical History:  Procedure Laterality Date  . LIPOSUCTION      Family History  Problem Relation Age of Onset  . Diabetes Mother   . Hypertension Father   . Diabetes Sister   . Diabetes Brother   . Heart failure Brother   . Kidney failure Brother     Social History   Tobacco Use  . Smoking status: Former Research scientist (life sciences)  . Smokeless tobacco: Current User    Types: Chew  Substance Use Topics  . Alcohol use: Not Currently  . Drug use: Not Currently    Outpatient Medications Prior to Visit  Medication Sig Dispense Refill  . allopurinol (ZYLOPRIM) 300 MG tablet Take  1 tablet (300 mg total) by mouth daily. To treat gout 90 tablet 1  . aspirin 325 MG tablet Take 1 tablet (325 mg total) by mouth daily. 90 tablet 3  . colchicine 0.6 MG tablet Take 1 tablet (0.6 mg total) by mouth daily. To treat gout 90 tablet 1  . diltiazem (CARTIA XT) 300 MG 24 hr capsule Take 1 capsule (300 mg total) by mouth daily. 90 capsule 3  . flecainide (TAMBOCOR) 50 MG tablet Take 1 tablet (50 mg total) by mouth daily. 90 tablet 3  . metoprolol succinate (TOPROL-XL) 100 MG 24 hr tablet Take 1 tablet (100 mg total) by mouth daily. Take with or immediately following a meal. 90 tablet 1  . tamsulosin (FLOMAX) 0.4 MG CAPS capsule Take 1 capsule (0.4 mg total) by mouth at bedtime. 90 capsule 1  . valsartan (DIOVAN) 320  MG tablet Take 1 tablet (320 mg total) by mouth daily. To control blood pressure 90 tablet 3   No facility-administered medications prior to visit.     Allergies  Allergen Reactions  . Other Hives    HAIR DYE    ROS Review of Systems  Constitutional: Positive for fatigue. Negative for chills and fever.  HENT: Negative for sore throat and trouble swallowing.   Respiratory: Negative for cough and shortness of breath.   Cardiovascular: Positive for palpitations. Negative for chest pain and leg swelling.  Gastrointestinal: Negative for abdominal pain, blood in stool, constipation, diarrhea and nausea.  Endocrine: Negative for cold intolerance, heat intolerance, polydipsia, polyphagia and polyuria.  Genitourinary: Positive for difficulty urinating. Negative for dysuria, flank pain and frequency.  Musculoskeletal: Negative for arthralgias, back pain and gait problem.  Neurological: Negative for dizziness and headaches.  Hematological: Negative for adenopathy. Does not bruise/bleed easily.      Objective:    Physical Exam  Constitutional: He is oriented to person, place, and time. He appears well-developed and well-nourished.  HENT:  Large tongue base; mild narrowing of posterior airway  Neck: Normal range of motion. Neck supple. No JVD present. No thyromegaly present.  Cardiovascular: Normal rate and regular rhythm.  Abdominal: Soft. There is no abdominal tenderness. There is no rebound and no guarding.  Genitourinary:    Genitourinary Comments: No CVA tenderness   Musculoskeletal: Normal range of motion.        General: No tenderness or edema.  Lymphadenopathy:    He has no cervical adenopathy.  Neurological: He is alert and oriented to person, place, and time.  Skin: Skin is warm and dry.  Nursing note and vitals reviewed.   BP (!) 182/110 (BP Location: Right Arm, Patient Position: Sitting, Cuff Size: Large)   Pulse 86   Temp 98.6 F (37 C) (Oral)   Ht 6\' 2"  (1.88 m)    Wt 229 lb (103.9 kg)   SpO2 97%   BMI 29.40 kg/m  Wt Readings from Last 3 Encounters:  07/13/18 229 lb (103.9 kg)  05/05/18 225 lb (102.1 kg)  03/09/18 232 lb (105.2 kg)     Health Maintenance Due  Topic Date Due  . Hepatitis C Screening  1955-05-07  . HIV Screening  11/01/1968  . COLONOSCOPY  11/02/2003    There are no preventive care reminders to display for this patient.  No results found for: TSH Lab Results  Component Value Date   WBC 5.8 03/09/2018   HGB 15.0 03/09/2018   HCT 45.9 03/09/2018   MCV 83 03/09/2018   PLT 298 03/09/2018  Lab Results  Component Value Date   NA 144 03/09/2018   K 4.5 03/09/2018   CO2 23 03/09/2018   GLUCOSE 97 03/09/2018   BUN 11 03/09/2018   CREATININE 1.08 03/09/2018   CALCIUM 9.3 03/09/2018   Lab Results  Component Value Date   CHOL 183 03/09/2018   Lab Results  Component Value Date   HDL 64 03/09/2018   Lab Results  Component Value Date   LDLCALC 104 (H) 03/09/2018   Lab Results  Component Value Date   TRIG 74 03/09/2018   Lab Results  Component Value Date   CHOLHDL 2.9 03/09/2018   No results found for: HGBA1C    Assessment & Plan:  1. Hypertensive urgency; 3. Essential Hypertension Patient with initial blood pressure of 173/102 with repeat of 182/110 and patient reported that he did take his blood pressure medication but only within 20 to 30 minutes of his appointment.  Patient has been out of town due to the death of his brother and return to yesterday.  He is not sure if his blood pressure is elevated due to stress.  Blood pressure was rechecked at the end of the visit and had improved to 163/101.  He needs to make sure that he is compliant with his medication as well as a low sodium/DASH diet.  Patient has been asked to follow-up with cardiology and referral placed.  Patient also reports history of sleep apnea but has been unable to obtain a CPAP as he states that he was supposed to have CPAP delivered prior to  moving from [place] but his CPAP was never received.  Patient sleep apnea is likely contributing to his uncontrolled hypertension as well as his paroxysmal atrial fibrillation..  Signs and symptoms of stroke discussed and he is aware that he needs to seek immediate medical attention if these occur or if he has onset of "worst headache of his life". - Ambulatory referral to Cardiology  2. Paroxysmal atrial fibrillation [facility]) Patient with hypertension and paroxysmal atrial fibrillation for which he will be referred to cardiology.  Patient also had EKG done at today's visit which showed possible ischemia as well as 1 out of 3 EKGs done showed prolonged QT.  Patient provided with refills of his current medications and he should also continue use of daily aspirin to help with embolic stroke prevention. - Ambulatory referral to Cardiology - aspirin 325 MG tablet; Take 1 tablet (325 mg total) by mouth daily.  Dispense: 90 tablet; Refill: 3 - diltiazem (CARTIA XT) 300 MG 24 hr capsule; Take 1 capsule (300 mg total) by mouth daily.  Dispense: 90 capsule; Refill: 1 - flecainide (TAMBOCOR) 50 MG tablet; Take 1 tablet (50 mg total) by mouth daily.  Dispense: 90 tablet; Refill: 3 - metoprolol succinate (TOPROL-XL) 100 MG 24 hr tablet; Take 1 tablet (100 mg total) by mouth daily. Take with or immediately following a meal.  Dispense: 90 tablet; Refill: 1 - Nocturnal polysomnography (NPSG); Future  3. Essential hypertension Medication refills provided and patient is to follow-up with cardiology, follow a low sodium diet and referral for sleep study as patient did not receive his prior CPAP.  He reports that he cannot recall the name of the prior location/provider of his past sleep study. - metoprolol succinate (TOPROL-XL) 100 MG 24 hr tablet; Take 1 tablet (100 mg total) by mouth daily. Take with or immediately following a meal.  Dispense: 90 tablet; Refill: 1 - valsartan (DIOVAN) 320 MG tablet; Take 1 tablet  (  320 mg total) by mouth daily. To control blood pressure  Dispense: 90 tablet; Refill: 1 - Nocturnal polysomnography (NPSG); Future  4. Palpitations; Abnormal EKG Patient with complaint of occasional sensation of palpitations.  He does have known paroxysmal atrial fibrillation.  Patient had EKG done in the office in follow-up and patient had abnormalities suggestive of inferior ischemia as well as one EKG showing prolonged QT.  Patient will also have BMP, TSH and CBC done in follow-up of his palpitations to look for electrolyte abnormality, thyroid disorder or anemia as possible causes of his palpitations.  Patient is being referred to have sleep study as he reports a history of obstructive sleep apnea but does not currently have a CPAP.  He will also be referred to cardiology for further evaluation and treatment. - Ambulatory referral to Sleep Studies - Ambulatory referral to Cardiology - EKG [id number] - Basic Metabolic Panel - TSH - CBC with Differential  5. History of sleep apnea Patient reports a history of sleep apnea with prior sleep study per his report showing moderate sleep apnea.  He unfortunately moved before he was able to obtain his CPAP machine and he does not recall for whom/wear he would need to sign record release in order to obtain his prior sleep study.  He will be scheduled for repeat sleep study in order to resume CPAP therapy - Nocturnal polysomnography (NPSG); Future  6. BPH associated with nocturia Patient reports history of BPH with nocturia.  Patient states that he occasionally has some difficulty with urination but this usually resolves.  He feels that the Flomax is working to help his symptoms.  New prescription provided for Flomax.  Patient declined urinalysis. - tamsulosin (FLOMAX) 0.4 MG CAPS capsule; Take 1 capsule (0.4 mg total) by mouth at bedtime.  Dispense: 90 capsule; Refill: 1  7. Chronic gout of multiple sites, unspecified cause Patient with chronic gout  which is currently controlled with the use of allopurinol.  Patient will have a uric acid level to make sure that his current allopurinol dose is adequate/effective.  Patient also takes once daily colchicine - Uric Acid - allopurinol (ZYLOPRIM) 300 MG tablet; Take 1 tablet (300 mg total) by mouth daily. To treat gout  Dispense: 90 tablet; Refill: 1 - colchicine 0.6 MG tablet; Take 1 tablet (0.6 mg total) by mouth daily. To treat gout  Dispense: 90 tablet; Refill: 1  8. Long-term use of aspirin therapy Patient will have CBC done in follow-up of once daily aspirin therapy to help reduce embolic stroke associated with atrial fibrillation - CBC with Differential  Allergies as of 07/13/2018      Reactions   Other Hives   HAIR DYE      Medication List       Accurate as of July 13, 2018 11:59 PM. If you have any questions, ask your nurse or doctor.        allopurinol 300 MG tablet Commonly known as: ZYLOPRIM Take 1 tablet (300 mg total) by mouth daily. To treat gout   aspirin 325 MG tablet Take 1 tablet (325 mg total) by mouth daily.   colchicine 0.6 MG tablet Take 1 tablet (0.6 mg total) by mouth daily. To treat gout   diltiazem 300 MG 24 hr capsule Commonly known as: Cartia XT Take 1 capsule (300 mg total) by mouth daily.   flecainide 50 MG tablet Commonly known as: TAMBOCOR Take 1 tablet (50 mg total) by mouth daily.   metoprolol succinate 100 MG 24  hr tablet Commonly known as: TOPROL-XL Take 1 tablet (100 mg total) by mouth daily. Take with or immediately following a meal.   tamsulosin 0.4 MG Caps capsule Commonly known as: FLOMAX Take 1 capsule (0.4 mg total) by mouth at bedtime.   valsartan 320 MG tablet Commonly known as: DIOVAN Take 1 tablet (320 mg total) by mouth daily. To control blood pressure      An After Visit Summary was printed and given to the patient.   Follow-up: Return in about 2 weeks (around 07/27/2018) for HTN- ED if feeling worse.   Antony Blackbird, MD

## 2018-07-13 NOTE — Progress Notes (Signed)
Bp pressure monitoring   Per pt every time his blood pressure goes up a little bit he gets a heart palpitation, per pt it mainly happens in the am  Per pt he just laid his brother to rest yesterday that's why he didn't make it for his appt.

## 2018-07-14 LAB — BASIC METABOLIC PANEL WITH GFR
BUN/Creatinine Ratio: 8 — ABNORMAL LOW (ref 10–24)
BUN: 11 mg/dL (ref 8–27)
CO2: 24 mmol/L (ref 20–29)
Calcium: 9.4 mg/dL (ref 8.6–10.2)
Chloride: 103 mmol/L (ref 96–106)
Creatinine, Ser: 1.3 mg/dL — ABNORMAL HIGH (ref 0.76–1.27)
GFR calc Af Amer: 67 mL/min/1.73
GFR calc non Af Amer: 58 mL/min/1.73 — ABNORMAL LOW
Glucose: 99 mg/dL (ref 65–99)
Potassium: 4 mmol/L (ref 3.5–5.2)
Sodium: 144 mmol/L (ref 134–144)

## 2018-07-14 LAB — CBC WITH DIFFERENTIAL/PLATELET
Basophils Absolute: 0.1 x10E3/uL (ref 0.0–0.2)
Basos: 1 %
EOS (ABSOLUTE): 0.2 x10E3/uL (ref 0.0–0.4)
Eos: 3 %
Hematocrit: 45.2 % (ref 37.5–51.0)
Hemoglobin: 15.1 g/dL (ref 13.0–17.7)
Immature Grans (Abs): 0 x10E3/uL (ref 0.0–0.1)
Immature Granulocytes: 0 %
Lymphocytes Absolute: 1.8 x10E3/uL (ref 0.7–3.1)
Lymphs: 31 %
MCH: 28.7 pg (ref 26.6–33.0)
MCHC: 33.4 g/dL (ref 31.5–35.7)
MCV: 86 fL (ref 79–97)
Monocytes Absolute: 0.5 x10E3/uL (ref 0.1–0.9)
Monocytes: 8 %
Neutrophils Absolute: 3.3 x10E3/uL (ref 1.4–7.0)
Neutrophils: 57 %
Platelets: 273 x10E3/uL (ref 150–450)
RBC: 5.26 x10E6/uL (ref 4.14–5.80)
RDW: 14.5 % (ref 11.6–15.4)
WBC: 5.8 x10E3/uL (ref 3.4–10.8)

## 2018-07-14 LAB — TSH: TSH: 1.1 u[IU]/mL (ref 0.450–4.500)

## 2018-07-14 LAB — URIC ACID: Uric Acid: 3.7 mg/dL (ref 3.7–8.6)

## 2018-07-16 ENCOUNTER — Encounter: Payer: Self-pay | Admitting: Family Medicine

## 2018-07-24 ENCOUNTER — Telehealth: Payer: Self-pay | Admitting: Physician Assistant

## 2018-07-24 NOTE — Progress Notes (Signed)
Cardiology Office Note    Date:  07/25/2018   ID:  Albert Bon., DOB [birth year], MRN [id number]  PCP:  Antony Blackbird, MD  Cardiologist:  Dr. Harrington Challenger  Chief Complaint: Palpitation and elevated BP  History of Present Illness:   Albert Outten. is a 63 y.o. male with hx of PAF, HTN, OSA not on CPAP seen for palpitations and elevated BP.   The pt was followed by Dr .Sammuel Cooper at [facility] in [place] >> Cardioverted 2-3x >>  Started flecainide. Hx of afib  2-3 times / year.   No ischemia by stress test in 2016. Normal LVEF by echo 03/2016.  Established cardiac care with Dr. Harrington Challenger 05/05/2018. CHADSVASCs score of 1 for HTN. Plan to start NOAC when turns to 63. Previously on Eliquis.   Recently had episodes of "skipped beat".  Symptom usually occurs in the morning.  Associated with shortness of breath and dizziness.  Somewhat different than his prior A. fib episode.  Also noted elevated blood pressure during this time.  Unsure if high blood pressure causing skipped beat or vice versa.  He had first episode in April lasting for 3 to 4 weeks and another episode in June.  He was seen by PCP 07/13/2018.  Noted hypertensive.  EKG showed sinus rhythm with T wave inversion in anterior leads-personally reviewed.  This is new changes compared to prior EKG in 2017.  For the past 2 weeks his symptoms has base completely resolved.  Patient is very active at baseline.  He used to do heavy lifting and running however not doing since April due to current episodes.  He denies syncope, orthopnea, PND or melena.   Past Medical History:  Diagnosis Date   BPH associated with nocturia 03/12/2018   BPH with obstruction/lower urinary tract symptoms    Chronic gout of multiple sites 03/12/2018   Essential hypertension    Gout    Long-term use of aspirin therapy 03/12/2018   OSA (obstructive sleep apnea)    Paroxysmal atrial fibrillation (HCC)     Past Surgical History:  Procedure Laterality Date    LIPOSUCTION      Current Medications: Prior to Admission medications   Medication Sig Start Date End Date Taking? Authorizing Provider  allopurinol (ZYLOPRIM) 300 MG tablet Take 1 tablet (300 mg total) by mouth daily. To treat gout 07/13/18   Fulp, Cammie, MD  aspirin 325 MG tablet Take 1 tablet (325 mg total) by mouth daily. 07/13/18   Fulp, Cammie, MD  colchicine 0.6 MG tablet Take 1 tablet (0.6 mg total) by mouth daily. To treat gout 07/13/18   Fulp, Cammie, MD  diltiazem (CARTIA XT) 300 MG 24 hr capsule Take 1 capsule (300 mg total) by mouth daily. 07/13/18   Fulp, Cammie, MD  flecainide (TAMBOCOR) 50 MG tablet Take 1 tablet (50 mg total) by mouth daily. 07/13/18   Fulp, Cammie, MD  metoprolol succinate (TOPROL-XL) 100 MG 24 hr tablet Take 1 tablet (100 mg total) by mouth daily. Take with or immediately following a meal. 07/13/18   Fulp, Cammie, MD  tamsulosin (FLOMAX) 0.4 MG CAPS capsule Take 1 capsule (0.4 mg total) by mouth at bedtime. 07/13/18   Fulp, Cammie, MD  valsartan (DIOVAN) 320 MG tablet Take 1 tablet (320 mg total) by mouth daily. To control blood pressure 07/13/18   Fulp, Cammie, MD    Allergies:   Other   Social History   Socioeconomic History   Marital status: Unknown  Spouse name: Not on file   Number of children: Not on file   Years of education: Not on file   Highest education level: Not on file  Occupational History   Not on file  Social Needs   Financial resource strain: Not on file   Food insecurity    Worry: Not on file    Inability: Not on file   Transportation needs    Medical: Not on file    Non-medical: Not on file  Tobacco Use   Smoking status: Former Smoker   Smokeless tobacco: Current User    Types: Chew  Substance and Sexual Activity   Alcohol use: Not Currently   Drug use: Not Currently   Sexual activity: Yes  Lifestyle   Physical activity    Days per week: Not on file    Minutes per session: Not on file   Stress: Not on file    Relationships   Social connections    Talks on phone: Not on file    Gets together: Not on file    Attends religious service: Not on file    Active member of club or organization: Not on file    Attends meetings of clubs or organizations: Not on file    Relationship status: Not on file  Other Topics Concern   Not on file  Social History Narrative   Not on file     Family History:  The patient's family history includes Diabetes in his brother, mother, and sister; Heart failure in his brother; Hypertension in his father; Kidney failure in his brother.   ROS:   Please see the history of present illness.    ROS All other systems reviewed and are negative.   PHYSICAL EXAM:   VS:  BP 128/86    Pulse 68    Ht 6\' 2"  (1.88 m)    Wt 226 lb (102.5 kg)    BMI 29.02 kg/m    GEN: Well nourished, well developed, in no acute distress  HEENT: normal  Neck: no JVD, carotid bruits, or masses Cardiac: RRR; no murmurs, rubs, or gallops,no edema  Respiratory:  clear to auscultation bilaterally, normal work of breathing GI: soft, nontender, nondistended, + BS MS: no deformity or atrophy  Skin: warm and dry, no rash Neuro:  Alert and Oriented x 3, Strength and sensation are intact Psych: euthymic mood, full affect  Wt Readings from Last 3 Encounters:  07/25/18 226 lb (102.5 kg)  07/13/18 229 lb (103.9 kg)  05/05/18 225 lb (102.1 kg)      Studies/Labs Reviewed:   EKG:  EKG is not ordered today.  Recent Labs: 07/13/2018: BUN 11; Creatinine, Ser 1.30; Hemoglobin 15.1; Platelets 273; Potassium 4.0; Sodium 144; TSH 1.100   Lipid Panel    Component Value Date/Time   CHOL 183 03/09/2018 1126   TRIG 74 03/09/2018 1126   HDL 64 03/09/2018 1126   CHOLHDL 2.9 03/09/2018 1126   LDLCALC 104 (H) 03/09/2018 1126    Additional studies/ records that were reviewed today include:  As summarized above    ASSESSMENT & PLAN:    1. Abnormal EKG Anterior T wave inversion which is reviewed with  DOD Dr. Acie Fredrickson.  Patient was very active until recent episode of skipped beats.  He had no exertional symptoms or limitation.  Plan was to get exercise Myoview however not doing due to current pandemic.  Will get Lexiscan for further evaluation.  Continue aspirin.  2.  Skipped beats/palpitation with prior  history of atrial fibrillation -Symptoms resolved in the past 2 weeks.  Sinus rhythm by exam today.  He was in sinus rhythm by EKG 07/13/2018. -We will get 30-day event monitor. -Complete cessation of caffeine products. -Continue high-dose aspirin.  Plan to start Eliquis at age 32.  Pending monitor result.  3.  OSA -Pending repeat study.  Medication Adjustments/Labs and Tests Ordered: Current medicines are reviewed at length with the patient today.  Concerns regarding medicines are outlined above.  Medication changes, Labs and Tests ordered today are listed in the Patient Instructions below. Patient Instructions  Medication Instructions:  Your physician recommends that you continue on your current medications as directed. Please refer to the Current Medication list given to you today.  If you need a refill on your cardiac medications before your next appointment, please call your pharmacy.   Lab work: None ordered  If you have labs (blood work) drawn today and your tests are completely normal, you will receive your results only by:  [place] (if you have MyChart) OR  A paper copy in the mail If you have any lab test that is abnormal or we need to change your treatment, we will call you to review the results.  Testing/Procedures: Your physician has requested that you have a lexiscan myoview. For further information please visit [email or web address]. Please follow instruction sheet, as given.   Your physician has recommended that you wear an event monitor. Event monitors are medical devices that record the hearts electrical activity. Doctors most often [place] these monitors to  diagnose arrhythmias. Arrhythmias are problems with the speed or rhythm of the heartbeat. The monitor is a small, portable device. You can wear one while you do your normal daily activities. This is usually used to diagnose what is causing palpitations/syncope (passing out).    Follow-Up: At [facility], you and your health needs are our priority.  As part of our continuing mission to provide you with exceptional heart care, we have created designated Provider Care Teams.  These Care Teams include your primary Cardiologist (physician) and Advanced Practice Providers (APPs -  Physician Assistants and Nurse Practitioners) who all work together to provide you with the care you need, when you need it.  You are scheduled for a in-office follow-up 08/31/2018 at 8:45, to see Vin Larayne Baxley, PA-C.  Please arrive 15 mins early to this appointment   Any Other Special Instructions Will Be Listed Below (If Applicable).  Ambulatory Cardiac Monitoring An ambulatory cardiac monitor is a small recording device that is used to detect abnormal heart rhythms (arrhythmias). Most monitors are connected by wires to flat, sticky disks (electrodes) that are then attached to your chest. You may need to wear a monitor if you have had symptoms such as:  Fast heartbeats (palpitations).  Dizziness.  Fainting or light-headedness.  Unexplained weakness.  Shortness of breath. There are several types of monitors. Some common monitors include:  Holter monitor. This records your heart rhythm continuously, usually for 24-48 hours.  Event (episodic) monitor. This monitor has a symptoms button, and when pushed, it will begin recording. You need to activate this monitor to record when you have a heart-related symptom.  Automatic detection monitor. This monitor will begin recording when it detects an abnormal heartbeat. What are the risks? Generally, these devices are safe to use. However, it is possible that the skin under  the electrodes will become irritated. How to prepare for monitoring Your health care provider will prepare your chest for the electrode  placement and show you how to use the monitor.  Do not apply lotions to your chest before monitoring.  Follow directions on how to care for the monitor, and how to return the monitor when the testing period is complete. How to use your cardiac monitor  Follow directions about how long to wear the monitor, and if you can take the monitor off in order to shower or bathe. ? Do not let the monitor get wet. ? Do not bathe, swim, or use a hot tub while wearing the monitor.  Keep your skin clean. Do not put body lotion or moisturizer on your chest.  Change the electrodes as told by your health care provider, or any time they stop sticking to your skin. You may need to use medical tape to keep them on.  Try to put the electrodes in slightly different places on your chest to help prevent skin irritation. Follow directions from your health care provider about where to place the electrodes.  Make sure the monitor is safely clipped to your clothing or in a location close to your body as recommended by your health care provider.  If your monitor has a symptoms button, press the button to mark an event as soon as you feel a heart-related symptom, such as: ? Dizziness. ? Weakness. ? Light-headedness. ? Palpitations. ? Thumping or pounding in your chest. ? Shortness of breath. ? Unexplained weakness.  Keep a diary of your activities, such as walking, doing chores, and taking medicine. It is very important to note what you were doing when you pushed the button to record your symptoms. This will help your health care provider determine what might be contributing to your symptoms.  Send the recorded information as recommended by your health care provider. It may take some time for your health care provider to process the results.  Change the batteries as told by your  health care provider.  Keep electronic devices away from your monitor. These include: ? Tablets. ? MP3 players. ? Cell phones.  While wearing your monitor you should avoid: ? Electric blankets. ? Armed forces operational officer. ? Electric toothbrushes. ? Microwave ovens. ? Magnets. ? Metal detectors. Get help right away if:  You have chest pain.  You have shortness of breath or extreme difficulty breathing.  You develop a very fast heartbeat that does not get better.  You develop dizziness that does not go away.  You faint or constantly feel like you are about to faint. Summary  An ambulatory cardiac monitor is a small recording device that is used to detect abnormal heart rhythms (arrhythmias).  Make sure you understand how to send the information from the monitor to your health care provider.  It is important to press the button on the monitor when you have any heart-related symptoms.  Keep a diary of your activities, such as walking, doing chores, and taking medicine. It is very important to note what you were doing when you pushed the button to record your symptoms. This will help your health care provider learn what might be causing your symptoms. This information is not intended to replace advice given to you by your health care provider. Make sure you discuss any questions you have with your health care provider. Document Released: 10/07/2007 Document Revised: 12/10/2016 Document Reviewed: 12/13/2015 Elsevier Patient Education  2020 [place].    Cardiac Nuclear Scan A cardiac nuclear scan is a test that is done to check the flow of blood to your heart. It  is done when you are resting and when you are exercising. The test looks for problems such as:  Not enough blood reaching a portion of the heart.  The heart muscle not working as it should. You may need this test if:  You have heart disease.  You have had lab results that are not normal.  You have had heart surgery  or a balloon procedure to open up blocked arteries (angioplasty).  You have chest pain.  You have shortness of breath. In this test, a special dye (tracer) is put into your bloodstream. The tracer will travel to your heart. A camera will then take pictures of your heart to see how the tracer moves through your heart. This test is usually done at a hospital and takes 2-4 hours. Tell a doctor about:  Any allergies you have.  All medicines you are taking, including vitamins, herbs, eye drops, creams, and over-the-counter medicines.  Any problems you or family members have had with anesthetic medicines.  Any blood disorders you have.  Any surgeries you have had.  Any medical conditions you have.  Whether you are pregnant or may be pregnant. What are the risks? Generally, this is a safe test. However, problems may occur, such as:  Serious chest pain and heart attack. This is only a risk if the stress portion of the test is done.  Rapid heartbeat.  A feeling of warmth in your chest. This feeling usually does not last long.  Allergic reaction to the tracer. What happens before the test?  Ask your doctor about changing or stopping your normal medicines. This is important.  Follow instructions from your doctor about what you cannot eat or drink.  Remove your jewelry on the day of the test. What happens during the test?  An IV tube will be inserted into one of your veins.  Your doctor will give you a small amount of tracer through the IV tube.  You will wait for 20-40 minutes while the tracer moves through your bloodstream.  Your heart will be monitored with an electrocardiogram (ECG).  You will lie down on an exam table.  Pictures of your heart will be taken for about 15-20 minutes.  You may also have a stress test. For this test, one of these things may be done: ? You will be asked to exercise on a treadmill or a stationary bike. ? You will be given medicines that will  make your heart work harder. This is done if you are unable to exercise.  When blood flow to your heart has peaked, a tracer will again be given through the IV tube.  After 20-40 minutes, you will get back on the exam table. More pictures will be taken of your heart.  Depending on the tracer that is used, more pictures may need to be taken 3-4 hours later.  Your IV tube will be removed when the test is over. The test may vary among doctors and hospitals. What happens after the test?  Ask your doctor: ? Whether you can return to your normal schedule, including diet, activities, and medicines. ? Whether you should drink more fluids. This will help to remove the tracer from your body. Drink enough fluid to keep your pee (urine) pale yellow.  Ask your doctor, or the department that is doing the test: ? When will my results be ready? ? How will I get my results? Summary  A cardiac nuclear scan is a test that is done to check  the flow of blood to your heart.  Tell your doctor whether you are pregnant or may be pregnant.  Before the test, ask your doctor about changing or stopping your normal medicines. This is important.  Ask your doctor whether you can return to your normal activities. You may be asked to drink more fluids. This information is not intended to replace advice given to you by your health care provider. Make sure you discuss any questions you have with your health care provider. Document Released: 06/13/2017 Document Revised: 04/19/2018 Document Reviewed: 06/13/2017 Elsevier Patient Education  2020 [place], [place], [place]  07/25/2018 10:27 AM    [facility] [place], [place], [place]  [address] Phone: [phone]; Fax: [phone]

## 2018-07-24 NOTE — Telephone Encounter (Signed)

## 2018-07-25 ENCOUNTER — Encounter: Payer: Self-pay | Admitting: *Deleted

## 2018-07-25 ENCOUNTER — Encounter: Payer: Self-pay | Admitting: Physician Assistant

## 2018-07-25 ENCOUNTER — Other Ambulatory Visit: Payer: Self-pay

## 2018-07-25 ENCOUNTER — Ambulatory Visit: Payer: 59 | Admitting: Physician Assistant

## 2018-07-25 VITALS — BP 128/86 | HR 68 | Ht 74.0 in | Wt 226.0 lb

## 2018-07-25 DIAGNOSIS — I10 Essential (primary) hypertension: Secondary | ICD-10-CM | POA: Diagnosis not present

## 2018-07-25 DIAGNOSIS — R9431 Abnormal electrocardiogram [ECG] [EKG]: Secondary | ICD-10-CM

## 2018-07-25 DIAGNOSIS — I480 Paroxysmal atrial fibrillation: Secondary | ICD-10-CM | POA: Diagnosis not present

## 2018-07-25 MED ORDER — VALSARTAN 320 MG PO TABS: 320.0000 mg | ORAL_TABLET | Freq: Every day | ORAL | 1 refills | Status: DC

## 2018-07-25 NOTE — Patient Instructions (Addendum)
Medication Instructions:  Your physician recommends that you continue on your current medications as directed. Please refer to the Current Medication list given to you today.  If you need a refill on your cardiac medications before your next appointment, please call your pharmacy.   Lab work: None ordered  If you have labs (blood work) drawn today and your tests are completely normal, you will receive your results only by: Marland Kitchen MyChart Message (if you have MyChart) OR . A paper copy in the mail If you have any lab test that is abnormal or we need to change your treatment, we will call you to review the results.  Testing/Procedures: Your physician has requested that you have a lexiscan myoview. For further information please visit [email or web address]. Please follow instruction sheet, as given.   Your physician has recommended that you wear an event monitor. Event monitors are medical devices that record the heart's electrical activity. Doctors most often [place] these monitors to diagnose arrhythmias. Arrhythmias are problems with the speed or rhythm of the heartbeat. The monitor is a small, portable device. You can wear one while you do your normal daily activities. This is usually used to diagnose what is causing palpitations/syncope (passing out).    Follow-Up: At [facility], you and your health needs are our priority.  As part of our continuing mission to provide you with exceptional heart care, we have created designated Provider Care Teams.  These Care Teams include your primary Cardiologist (physician) and Advanced Practice Providers (APPs -  Physician Assistants and Nurse Practitioners) who all work together to provide you with the care you need, when you need it. . You are scheduled for a in-office follow-up 08/31/2018 at 8:45, to see Vin Bhagat, PA-C.  Please arrive 15 mins early to this appointment   Any Other Special Instructions Will Be Listed Below (If Applicable).  Ambulatory  Cardiac Monitoring An ambulatory cardiac monitor is a small recording device that is used to detect abnormal heart rhythms (arrhythmias). Most monitors are connected by wires to flat, sticky disks (electrodes) that are then attached to your chest. You may need to wear a monitor if you have had symptoms such as:  Fast heartbeats (palpitations).  Dizziness.  Fainting or light-headedness.  Unexplained weakness.  Shortness of breath. There are several types of monitors. Some common monitors include:  Holter monitor. This records your heart rhythm continuously, usually for 24-48 hours.  Event (episodic) monitor. This monitor has a symptoms button, and when pushed, it will begin recording. You need to activate this monitor to record when you have a heart-related symptom.  Automatic detection monitor. This monitor will begin recording when it detects an abnormal heartbeat. What are the risks? Generally, these devices are safe to use. However, it is possible that the skin under the electrodes will become irritated. How to prepare for monitoring Your health care provider will prepare your chest for the electrode placement and show you how to use the monitor.  Do not apply lotions to your chest before monitoring.  Follow directions on how to care for the monitor, and how to return the monitor when the testing period is complete. How to use your cardiac monitor  Follow directions about how long to wear the monitor, and if you can take the monitor off in order to shower or bathe. ? Do not let the monitor get wet. ? Do not bathe, swim, or use a hot tub while wearing the monitor.  Keep your skin clean. Do not put  body lotion or moisturizer on your chest.  Change the electrodes as told by your health care provider, or any time they stop sticking to your skin. You may need to use medical tape to keep them on.  Try to put the electrodes in slightly different places on your chest to help prevent  skin irritation. Follow directions from your health care provider about where to place the electrodes.  Make sure the monitor is safely clipped to your clothing or in a location close to your body as recommended by your health care provider.  If your monitor has a symptoms button, press the button to mark an event as soon as you feel a heart-related symptom, such as: ? Dizziness. ? Weakness. ? Light-headedness. ? Palpitations. ? Thumping or pounding in your chest. ? Shortness of breath. ? Unexplained weakness.  Keep a diary of your activities, such as walking, doing chores, and taking medicine. It is very important to note what you were doing when you pushed the button to record your symptoms. This will help your health care provider determine what might be contributing to your symptoms.  Send the recorded information as recommended by your health care provider. It may take some time for your health care provider to process the results.  Change the batteries as told by your health care provider.  Keep electronic devices away from your monitor. These include: ? Tablets. ? MP3 players. ? Cell phones.  While wearing your monitor you should avoid: ? Electric blankets. ? Armed forces operational officer. ? Electric toothbrushes. ? Microwave ovens. ? Magnets. ? Metal detectors. Get help right away if:  You have chest pain.  You have shortness of breath or extreme difficulty breathing.  You develop a very fast heartbeat that does not get better.  You develop dizziness that does not go away.  You faint or constantly feel like you are about to faint. Summary  An ambulatory cardiac monitor is a small recording device that is used to detect abnormal heart rhythms (arrhythmias).  Make sure you understand how to send the information from the monitor to your health care provider.  It is important to press the button on the monitor when you have any heart-related symptoms.  Keep a diary of your  activities, such as walking, doing chores, and taking medicine. It is very important to note what you were doing when you pushed the button to record your symptoms. This will help your health care provider learn what might be causing your symptoms. This information is not intended to replace advice given to you by your health care provider. Make sure you discuss any questions you have with your health care provider. Document Released: 10/07/2007 Document Revised: 12/10/2016 Document Reviewed: 12/13/2015 Elsevier Patient Education  2020 [place].    Cardiac Nuclear Scan A cardiac nuclear scan is a test that is done to check the flow of blood to your heart. It is done when you are resting and when you are exercising. The test looks for problems such as:  Not enough blood reaching a portion of the heart.  The heart muscle not working as it should. You may need this test if:  You have heart disease.  You have had lab results that are not normal.  You have had heart surgery or a balloon procedure to open up blocked arteries (angioplasty).  You have chest pain.  You have shortness of breath. In this test, a special dye (tracer) is put into your bloodstream. The tracer will  travel to your heart. A camera will then take pictures of your heart to see how the tracer moves through your heart. This test is usually done at a hospital and takes 2-4 hours. Tell a doctor about:  Any allergies you have.  All medicines you are taking, including vitamins, herbs, eye drops, creams, and over-the-counter medicines.  Any problems you or family members have had with anesthetic medicines.  Any blood disorders you have.  Any surgeries you have had.  Any medical conditions you have.  Whether you are pregnant or may be pregnant. What are the risks? Generally, this is a safe test. However, problems may occur, such as:  Serious chest pain and heart attack. This is only a risk if the stress portion  of the test is done.  Rapid heartbeat.  A feeling of warmth in your chest. This feeling usually does not last long.  Allergic reaction to the tracer. What happens before the test?  Ask your doctor about changing or stopping your normal medicines. This is important.  Follow instructions from your doctor about what you cannot eat or drink.  Remove your jewelry on the day of the test. What happens during the test?  An IV tube will be inserted into one of your veins.  Your doctor will give you a small amount of tracer through the IV tube.  You will wait for 20-40 minutes while the tracer moves through your bloodstream.  Your heart will be monitored with an electrocardiogram (ECG).  You will lie down on an exam table.  Pictures of your heart will be taken for about 15-20 minutes.  You may also have a stress test. For this test, one of these things may be done: ? You will be asked to exercise on a treadmill or a stationary bike. ? You will be given medicines that will make your heart work harder. This is done if you are unable to exercise.  When blood flow to your heart has peaked, a tracer will again be given through the IV tube.  After 20-40 minutes, you will get back on the exam table. More pictures will be taken of your heart.  Depending on the tracer that is used, more pictures may need to be taken 3-4 hours later.  Your IV tube will be removed when the test is over. The test may vary among doctors and hospitals. What happens after the test?  Ask your doctor: ? Whether you can return to your normal schedule, including diet, activities, and medicines. ? Whether you should drink more fluids. This will help to remove the tracer from your body. Drink enough fluid to keep your pee (urine) pale yellow.  Ask your doctor, or the department that is doing the test: ? When will my results be ready? ? How will I get my results? Summary  A cardiac nuclear scan is a test that is  done to check the flow of blood to your heart.  Tell your doctor whether you are pregnant or may be pregnant.  Before the test, ask your doctor about changing or stopping your normal medicines. This is important.  Ask your doctor whether you can return to your normal activities. You may be asked to drink more fluids. This information is not intended to replace advice given to you by your health care provider. Make sure you discuss any questions you have with your health care provider. Document Released: 06/13/2017 Document Revised: 04/19/2018 Document Reviewed: 06/13/2017 Elsevier Patient Education  2020 Elsevier  Inc.

## 2018-07-26 ENCOUNTER — Telehealth: Payer: Self-pay | Admitting: Pharmacist

## 2018-07-26 ENCOUNTER — Telehealth: Payer: Self-pay | Admitting: *Deleted

## 2018-07-26 MED ORDER — IRBESARTAN 300 MG PO TABS: 300.0000 mg | ORAL_TABLET | Freq: Every day | ORAL | 2 refills | Status: DC

## 2018-07-26 MED FILL — IRBESARTAN 300 MG TABS: 300 | 30 days supply | Qty: 30 | Fill #0

## 2018-07-26 NOTE — Telephone Encounter (Signed)
pharmacy stated that patient Valsartan 320 mg is on back order and would like to know if there's anything else you would like patient to take and if you could resend that to the pharmacy

## 2018-07-26 NOTE — Telephone Encounter (Signed)
Notified by [facility] pharmacy that valsartan is on backorder. Consulted with Dr. Chapman Fitch. Will send in for irbesartan 300 mg to replace. Pt is to be notified by Thomasena Edis in the pharmacy of the change. I will also reach out.

## 2018-07-26 NOTE — Telephone Encounter (Signed)
Thank you :)

## 2018-07-26 NOTE — Telephone Encounter (Signed)
Discussed with Lurena Joiner and replacement prescription was sent in for Avapro, irbesartan, at 300 mg once per day

## 2018-07-26 NOTE — Telephone Encounter (Signed)
[employer] to ship 30 day cardiac event monitor to your home.  Instructions reviewed briefly as they are included in the monitor kit.

## 2018-07-27 ENCOUNTER — Ambulatory Visit: Payer: 59 | Admitting: Family Medicine

## 2018-08-02 ENCOUNTER — Ambulatory Visit (INDEPENDENT_AMBULATORY_CARE_PROVIDER_SITE_OTHER): Payer: Managed Care, Other (non HMO)

## 2018-08-02 ENCOUNTER — Telehealth: Payer: Self-pay | Admitting: Family Medicine

## 2018-08-02 DIAGNOSIS — R002 Palpitations: Secondary | ICD-10-CM

## 2018-08-02 DIAGNOSIS — R0602 Shortness of breath: Secondary | ICD-10-CM

## 2018-08-02 DIAGNOSIS — I10 Essential (primary) hypertension: Secondary | ICD-10-CM | POA: Diagnosis not present

## 2018-08-02 DIAGNOSIS — R42 Dizziness and giddiness: Secondary | ICD-10-CM | POA: Diagnosis not present

## 2018-08-02 DIAGNOSIS — R9431 Abnormal electrocardiogram [ECG] [EKG]: Secondary | ICD-10-CM

## 2018-08-02 MED FILL — METOPROLOL SUCCINATE ER 100: 100 | 30 days supply | Qty: 30 | Fill #2

## 2018-08-02 NOTE — Telephone Encounter (Signed)
Terry with the sleep center called to inform patients sleep study was denied by insurance [employer]. Please follow up.

## 2018-08-04 ENCOUNTER — Telehealth (HOSPITAL_COMMUNITY): Payer: Self-pay | Admitting: *Deleted

## 2018-08-04 NOTE — Telephone Encounter (Signed)
Patient given detailed instructions per Myocardial Perfusion Study Information Sheet for the test on 7/28 Patient notified to arrive 15 minutes early and that it is imperative to arrive on time for appointment to keep from having the test rescheduled.  If you need to cancel or reschedule your appointment, please call the office within 24 hours of your appointment. . Patient verbalized understanding. Kirstie Peri

## 2018-08-08 ENCOUNTER — Encounter (HOSPITAL_COMMUNITY): Payer: Self-pay

## 2018-08-08 ENCOUNTER — Other Ambulatory Visit: Payer: Self-pay

## 2018-08-08 ENCOUNTER — Ambulatory Visit (HOSPITAL_COMMUNITY): Payer: 59 | Attending: Cardiology

## 2018-08-08 DIAGNOSIS — R9431 Abnormal electrocardiogram [ECG] [EKG]: Secondary | ICD-10-CM

## 2018-08-08 LAB — MYOCARDIAL PERFUSION IMAGING
LV dias vol: 125 mL (ref 62–150)
LV sys vol: 58 mL
Peak HR: 82 {beats}/min
Rest HR: 52 {beats}/min
SDS: 0
SRS: 0
SSS: 0
TID: 1

## 2018-08-08 MED ORDER — TECHNETIUM TC 99M TETROFOSMIN IV KIT
31.4000 | PACK | Freq: Once | INTRAVENOUS | Status: AC | PRN
  Administered 2018-08-08: 31.4 via INTRAVENOUS
  Filled 2018-08-08: qty 32

## 2018-08-08 MED ORDER — REGADENOSON 0.4 MG/5ML IV SOLN
0.4000 mg | Freq: Once | INTRAVENOUS | Status: AC
  Administered 2018-08-08: 0.4 mg via INTRAVENOUS

## 2018-08-08 MED ORDER — TECHNETIUM TC 99M TETROFOSMIN IV KIT
10.7000 | PACK | Freq: Once | INTRAVENOUS | Status: AC | PRN
  Administered 2018-08-08: 10.7 via INTRAVENOUS
  Filled 2018-08-08: qty 11

## 2018-08-09 NOTE — Progress Notes (Signed)
Pt has been made aware of normal result and verbalized understanding.  jw 08/09/2027

## 2018-08-14 MED FILL — FLECAINIDE ACETATE 50 MG TA: 50 | 30 days supply | Qty: 30 | Fill #1

## 2018-08-14 MED FILL — TAMSULOSIN HCL 0.4 MG CAP: 0.4 | 30 days supply | Qty: 30 | Fill #1

## 2018-08-14 MED FILL — ALLOPURINOL 300 MG TAB: 300 | 30 days supply | Qty: 30 | Fill #1

## 2018-08-18 MED FILL — CARTIA XT 300 MG CAPSULE SA: 300 | 30 days supply | Qty: 30 | Fill #1

## 2018-08-18 MED FILL — COLCHICINE 0.6 MG TABS: 0.6 | 30 days supply | Qty: 30 | Fill #1

## 2018-08-24 MED FILL — IRBESARTAN 300 MG TABS: 300 | 30 days supply | Qty: 30 | Fill #1

## 2018-08-29 NOTE — Progress Notes (Signed)
Cardiology Office Note    Date:  08/31/2018   ID:  Albert Joye., DOB 06-05-1955, MRN [id number]  PCP:  Antony Blackbird, MD  Cardiologist:  Dr. Harrington Challenger  Chief Complaint:  6 weeks follow up  History of Present Illness:   Albert Mcneish. is a 63 y.o. male with hx of PAF, HTN, OSA not on CPAP seen for follow up.   The pt was followed by Dr .Sammuel Cooper at [facility] in [place] >>Cardioverted 2-3x >>Started flecainide. Hx of afib  2-3 times / year.  No ischemia by stress test in 2016. Normal LVEF by echo 03/2016.  Established cardiac care with Dr. Harrington Challenger 05/05/2018. CHADSVASCs score of 1 for HTN. Plan to start NOAC when turns to 63. Previously on Eliquis.   Seen by me 07/2018 for chest pain and palpitations. Follow up stress test is low risk study. Pending event monitor.   Here today for follow up.  Patient reports no recurrent chest pain or palpitation.  Today is the last day for monitor.  He thinks his palpitation was due to intermittent high blood pressure.  He takes his oral antihypertensive regimen in the morning but not at schedule time.  Sometimes early and sometimes late in the afternoon.  I have advised him to take all antihypertensive around the same time each day.  Keep a log of blood pressure.  Past Medical History:  Diagnosis Date  . BPH associated with nocturia 03/12/2018  . BPH with obstruction/lower urinary tract symptoms   . Chronic gout of multiple sites 03/12/2018  . Essential hypertension   . Gout   . Long-term use of aspirin therapy 03/12/2018  . OSA (obstructive sleep apnea)   . Paroxysmal atrial fibrillation [facility])     Past Surgical History:  Procedure Laterality Date  . LIPOSUCTION      Current Medications: Prior to Admission medications   Medication Sig Start Date End Date Taking? Authorizing Provider  allopurinol (ZYLOPRIM) 300 MG tablet Take 1 tablet (300 mg total) by mouth daily. To treat gout 07/13/18   Fulp, Cammie, MD  aspirin 325 MG tablet Take 1  tablet (325 mg total) by mouth daily. 07/13/18   Fulp, Cammie, MD  colchicine 0.6 MG tablet Take 1 tablet (0.6 mg total) by mouth daily. To treat gout 07/13/18   Fulp, Cammie, MD  diltiazem (CARTIA XT) 300 MG 24 hr capsule Take 1 capsule (300 mg total) by mouth daily. 07/13/18   Fulp, Cammie, MD  flecainide (TAMBOCOR) 50 MG tablet Take 1 tablet (50 mg total) by mouth daily. 07/13/18   Fulp, Cammie, MD  irbesartan (AVAPRO) 300 MG tablet Take 1 tablet (300 mg total) by mouth daily. 07/26/18   Fulp, Cammie, MD  metoprolol succinate (TOPROL-XL) 100 MG 24 hr tablet Take 1 tablet (100 mg total) by mouth daily. Take with or immediately following a meal. 07/13/18   Fulp, Cammie, MD  tamsulosin (FLOMAX) 0.4 MG CAPS capsule Take 1 capsule (0.4 mg total) by mouth at bedtime. 07/13/18   Fulp, Cammie, MD  valsartan (DIOVAN) 320 MG tablet Take 1 tablet (320 mg total) by mouth daily. To control blood pressure 07/25/18   Akanksha Bellmore, [place], [place]    Allergies:   Other   Social History   Socioeconomic History  . Marital status: Unknown    Spouse name: Not on file  . Number of children: Not on file  . Years of education: Not on file  . Highest education level: Not on file  Occupational History  . Not on file  Social Needs  . Financial resource strain: Not on file  . Food insecurity    Worry: Not on file    Inability: Not on file  . Transportation needs    Medical: Not on file    Non-medical: Not on file  Tobacco Use  . Smoking status: Former Research scientist (life sciences)  . Smokeless tobacco: Current User    Types: Chew  Substance and Sexual Activity  . Alcohol use: Not Currently  . Drug use: Not Currently  . Sexual activity: Yes  Lifestyle  . Physical activity    Days per week: Not on file    Minutes per session: Not on file  . Stress: Not on file  Relationships  . Social Herbalist on phone: Not on file    Gets together: Not on file    Attends religious service: Not on file    Active member of club or  organization: Not on file    Attends meetings of clubs or organizations: Not on file    Relationship status: Not on file  Other Topics Concern  . Not on file  Social History Narrative  . Not on file     Family History:  The patient's family history includes Diabetes in his brother, mother, and sister; Heart failure in his brother; Hypertension in his father; Kidney failure in his brother.   ROS:   Please see the history of present illness.    ROS All other systems reviewed and are negative.   PHYSICAL EXAM:   VS:  BP (!) 160/100   Pulse 66   Ht 6\' 2"  (1.88 m)   Wt 229 lb 1.9 oz (103.9 kg)   SpO2 96%   BMI 29.42 kg/m    GEN: Well nourished, well developed, in no acute distress  HEENT: normal  Neck: no JVD, carotid bruits, or masses Cardiac: RRR; no murmurs, rubs, or gallops,no edema  Respiratory:  clear to auscultation bilaterally, normal work of breathing GI: soft, nontender, nondistended, + BS MS: no deformity or atrophy  Skin: warm and dry, no rash Neuro:  Alert and Oriented x 3, Strength and sensation are intact Psych: euthymic mood, full affect  Wt Readings from Last 3 Encounters:  08/31/18 229 lb 1.9 oz (103.9 kg)  08/08/18 226 lb (102.5 kg)  07/25/18 226 lb (102.5 kg)      Studies/Labs Reviewed:   EKG:  EKG is ordered today.  The ekg ordered today demonstrates normal sinus rhythm at rate of 66 bpm, anterior T wave inversion  Recent Labs: 07/13/2018: BUN 11; Creatinine, Ser 1.30; Hemoglobin 15.1; Platelets 273; Potassium 4.0; Sodium 144; TSH 1.100   Lipid Panel    Component Value Date/Time   CHOL 183 03/09/2018 1126   TRIG 74 03/09/2018 1126   HDL 64 03/09/2018 1126   CHOLHDL 2.9 03/09/2018 1126   LDLCALC 104 (H) 03/09/2018 1126    Additional studies/ records that were reviewed today include:   Stress test 07/2018  Nuclear stress EF: 53%.  The left ventricular ejection fraction is mildly decreased (45-54%).  There was no ST segment deviation noted  during stress. Baseline nonspecific ST abnormality present.  The study is normal.  This is a low risk study.       ASSESSMENT & PLAN:    1. Chest pain No recurrent episodes since last office visit.  Stress test low risk.  EKG with persistent T wave inversion anteriorly.  2.  Palpitation  with remote history of A. Fib Patient denies any single episode of palpitation while on monitor, today is last day.  Continue flecainide, Cardizem and Toprol.  He is on high dose aspirin.  3.  Hypertension -Repeat check showed improved blood pressure to 140/92.  He took his morning medication 20 minutes prior to coming here.  I have encouraged to take all antihypertensive at the same time each day.  He will keep log of his blood pressure and send [place] for review.  4. Snoring - pending sleep study   Medication Adjustments/Labs and Tests Ordered: Current medicines are reviewed at length with the patient today.  Concerns regarding medicines are outlined above.  Medication changes, Labs and Tests ordered today are listed in the Patient Instructions below. Patient Instructions  Medication Instructions:  Your physician recommends that you continue on your current medications as directed. Please refer to the Current Medication list given to you today.  If you need a refill on your cardiac medications before your next appointment, please call your pharmacy.   Lab work: None ordered  If you have labs (blood work) drawn today and your tests are completely normal, you will receive your results only by: Marland Kitchen MyChart Message (if you have MyChart) OR . A paper copy in the mail If you have any lab test that is abnormal or we need to change your treatment, we will call you to review the results.  Testing/Procedures: None ordered  Follow-Up: At [facility], you and your health needs are our priority.  As part of our continuing mission to provide you with exceptional heart care, we have created designated  Provider Care Teams.  These Care Teams include your primary Cardiologist (physician) and Advanced Practice Providers (APPs -  Physician Assistants and Nurse Practitioners) who all work together to provide you with the care you need, when you need it. . 12/01/2018 9:00 you are scheduled for a in-office visit with Dr. Harrington Challenger  Any Other Special Instructions Will Be Listed Below (If Applicable).        Jarrett Soho, [place]  08/31/2018 9:03 AM    [facility] [address], [place], [place]  [address] Phone: [phone]; Fax: [phone]

## 2018-08-31 ENCOUNTER — Ambulatory Visit: Payer: 59 | Admitting: Physician Assistant

## 2018-08-31 ENCOUNTER — Other Ambulatory Visit: Payer: Self-pay

## 2018-08-31 ENCOUNTER — Encounter: Payer: Self-pay | Admitting: Physician Assistant

## 2018-08-31 VITALS — BP 160/100 | HR 66 | Ht 74.0 in | Wt 229.1 lb

## 2018-08-31 DIAGNOSIS — I10 Essential (primary) hypertension: Secondary | ICD-10-CM | POA: Diagnosis not present

## 2018-08-31 DIAGNOSIS — R0683 Snoring: Secondary | ICD-10-CM | POA: Diagnosis not present

## 2018-08-31 DIAGNOSIS — I480 Paroxysmal atrial fibrillation: Secondary | ICD-10-CM

## 2018-08-31 MED ORDER — FLECAINIDE ACETATE 50 MG PO TABS: 50.0000 mg | ORAL_TABLET | Freq: Every day | ORAL | 3 refills | Status: DC

## 2018-08-31 MED ORDER — IRBESARTAN 300 MG PO TABS: 300.0000 mg | ORAL_TABLET | Freq: Every day | ORAL | 2 refills | Status: DC

## 2018-08-31 MED ORDER — VALSARTAN 320 MG PO TABS: 320.0000 mg | ORAL_TABLET | Freq: Every day | ORAL | 1 refills | Status: DC

## 2018-08-31 MED ORDER — DILTIAZEM HCL ER COATED BEADS 300 MG PO CP24: 300.0000 mg | ORAL_CAPSULE | Freq: Every day | ORAL | 1 refills | Status: DC

## 2018-08-31 MED ORDER — METOPROLOL SUCCINATE ER 100 MG PO TB24: 100.0000 mg | ORAL_TABLET | Freq: Every day | ORAL | 1 refills | Status: DC

## 2018-08-31 MED FILL — METOPROLOL SUCCINATE ER 100: 100 | 30 days supply | Qty: 30 | Fill #0

## 2018-08-31 NOTE — Patient Instructions (Signed)
Medication Instructions:  Your physician recommends that you continue on your current medications as directed. Please refer to the Current Medication list given to you today.  If you need a refill on your cardiac medications before your next appointment, please call your pharmacy.   Lab work: None ordered  If you have labs (blood work) drawn today and your tests are completely normal, you will receive your results only by: Marland Kitchen MyChart Message (if you have MyChart) OR . A paper copy in the mail If you have any lab test that is abnormal or we need to change your treatment, we will call you to review the results.  Testing/Procedures: None ordered  Follow-Up: At [facility], you and your health needs are our priority.  As part of our continuing mission to provide you with exceptional heart care, we have created designated Provider Care Teams.  These Care Teams include your primary Cardiologist (physician) and Advanced Practice Providers (APPs -  Physician Assistants and Nurse Practitioners) who all work together to provide you with the care you need, when you need it. . 12/01/2018 9:00 you are scheduled for a in-office visit with Dr. Harrington Challenger  Any Other Special Instructions Will Be Listed Below (If Applicable).

## 2018-09-06 ENCOUNTER — Other Ambulatory Visit: Payer: Self-pay | Admitting: Physician Assistant

## 2018-09-06 DIAGNOSIS — R0602 Shortness of breath: Secondary | ICD-10-CM

## 2018-09-06 DIAGNOSIS — R42 Dizziness and giddiness: Secondary | ICD-10-CM

## 2018-09-06 DIAGNOSIS — I10 Essential (primary) hypertension: Secondary | ICD-10-CM

## 2018-09-06 DIAGNOSIS — R9431 Abnormal electrocardiogram [ECG] [EKG]: Secondary | ICD-10-CM

## 2018-09-06 DIAGNOSIS — R002 Palpitations: Secondary | ICD-10-CM

## 2018-09-11 ENCOUNTER — Telehealth: Payer: Self-pay | Admitting: Internal Medicine

## 2018-09-11 NOTE — Telephone Encounter (Signed)
Pt with hx of PAF   Also HTN Cardoverted in past  Recent monitor without afib   With hx now CHADSVASC about to be 2   Should be on eliquis or Xarelot and off of aspirin    Xarelto 20 mg is fine   He had been on eliquis 5 bid   That is ok too  Pt preference  D/C aspirin

## 2018-09-13 MED ORDER — APIXABAN 5 MG PO TABS: 5.0000 mg | ORAL_TABLET | Freq: Two times a day (BID) | ORAL | 11 refills | Status: DC

## 2018-09-13 MED FILL — ELIQUIS 5 MG TABLET: 5 | 30 days supply | Qty: 60 | Fill #0

## 2018-09-13 NOTE — Addendum Note (Signed)
Addended by: Rodman Key on: 09/13/2018 01:33 PM   Modules accepted: Orders

## 2018-09-13 NOTE — Telephone Encounter (Signed)
Spoke with patient and informed of monitor results and recommendations below from Dr. Harrington Challenger. Pt verbalizes understanding and agreement.  He will pick up and start Eliquis 5 mg bid and stop the aspirin. Has follow up scheduled 12/01/18 with Dr. Harrington Challenger.

## 2018-09-14 MED FILL — TAMSULOSIN HCL 0.4 MG CAP: 0.4 | 30 days supply | Qty: 30 | Fill #2

## 2018-09-14 MED FILL — FLECAINIDE ACETATE 50 MG TA: 50 | 30 days supply | Qty: 30 | Fill #2

## 2018-09-14 MED FILL — COLCHICINE 0.6 MG TABS: 0.6 | 30 days supply | Qty: 30 | Fill #2

## 2018-09-14 MED FILL — ALLOPURINOL 300 MG TAB: 300 | 30 days supply | Qty: 30 | Fill #2

## 2018-09-19 MED FILL — IRBESARTAN 300 MG TABS: 300 | 30 days supply | Qty: 30 | Fill #2

## 2018-09-19 MED FILL — CARTIA XT 300 MG CAPSULE SA: 300 | 30 days supply | Qty: 30 | Fill #2

## 2018-09-22 ENCOUNTER — Ambulatory Visit: Payer: Managed Care, Other (non HMO) | Attending: Family Medicine | Admitting: Family Medicine

## 2018-09-22 ENCOUNTER — Other Ambulatory Visit: Payer: Self-pay

## 2018-09-22 ENCOUNTER — Encounter: Payer: Self-pay | Admitting: Family Medicine

## 2018-09-22 DIAGNOSIS — N182 Chronic kidney disease, stage 2 (mild): Secondary | ICD-10-CM

## 2018-09-22 DIAGNOSIS — M1A09X0 Idiopathic chronic gout, multiple sites, without tophus (tophi): Secondary | ICD-10-CM | POA: Diagnosis not present

## 2018-09-22 DIAGNOSIS — I480 Paroxysmal atrial fibrillation: Secondary | ICD-10-CM | POA: Diagnosis not present

## 2018-09-22 DIAGNOSIS — Z8669 Personal history of other diseases of the nervous system and sense organs: Secondary | ICD-10-CM

## 2018-09-22 DIAGNOSIS — M79672 Pain in left foot: Secondary | ICD-10-CM

## 2018-09-22 DIAGNOSIS — G8929 Other chronic pain: Secondary | ICD-10-CM

## 2018-09-22 DIAGNOSIS — I10 Essential (primary) hypertension: Secondary | ICD-10-CM | POA: Diagnosis not present

## 2018-09-22 DIAGNOSIS — Z7901 Long term (current) use of anticoagulants: Secondary | ICD-10-CM

## 2018-09-22 NOTE — Progress Notes (Signed)
Virtual Visit via Telephone Note  I connected with Albert Good, Albert Good. on 09/22/18 at  2:10 PM EDT by telephone and verified that I am speaking with the correct person using two identifiers.   I discussed the limitations, risks, security and privacy concerns of performing an evaluation and management service by telephone and the availability of in person appointments. I also discussed with the patient that there may be a patient responsible charge related to this service. The patient expressed understanding and agreed to proceed.  Patient Location: Home Provider Location: [facility] Others participating in call: none   History of Present Illness:      63 year old male seen in follow-up of chronic medical issues including hypertension, paroxysmal atrial fibrillation and stage II chronic kidney disease.  Patient additionally has complaint of continued left heel pain as well as history of sleep apnea for which he would like to be scheduled for sleep study.  Patient states that when he lived in [place] he had an abnormal sleep study but moved before obtaining his CPAP machine.  He would like to be scheduled for sleep study so that he can receive CPAP machine for treatment.  Patient reports he recently saw his cardiologist.  Patient reports that he is doing well.  Blood pressure has remained controlled.  He has had no symptoms of chest pain or palpitations related to atrial fibrillation.  He continues to take Eliquis and denies any issues with unusual bruising or bleeding.  She denies any blood in the stool, no blood in the urine, no nosebleeds and no black stools.  He is aware of the need to avoid use of nonsteroidal anti-inflammatory medication due to his chronic use of blood thinning medication and he additionally has mild chronic kidney disease.        He reports that he continues to have issues with pain in his left heel.  He has not noticed any increased warmth or redness such as when he has  gout flareups.  Heel pain generally occurs with bearing weight or walking.  He does continue to take allopurinol due to history of gout.  Pain ranges from about a 6-8 on a 0-to-10 scale and is difficult to describe but is sharp and throbbing at times.   Past Medical History:  Diagnosis Date  . BPH associated with nocturia 03/12/2018  . BPH with obstruction/lower urinary tract symptoms   . Chronic gout of multiple sites 03/12/2018  . Essential hypertension   . Gout   . Long-term use of aspirin therapy 03/12/2018  . OSA (obstructive sleep apnea)   . Paroxysmal atrial fibrillation [facility])     Past Surgical History:  Procedure Laterality Date  . LIPOSUCTION      Family History  Problem Relation Age of Onset  . Diabetes Mother   . Hypertension Father   . Diabetes Sister   . Diabetes Brother   . Heart failure Brother   . Kidney failure Brother     Social History   Tobacco Use  . Smoking status: Former Research scientist (life sciences)  . Smokeless tobacco: Current User    Types: Chew  Substance Use Topics  . Alcohol use: Not Currently  . Drug use: Not Currently     Allergies  Allergen Reactions  . Other Hives    HAIR DYE       Observations/Objective: No vital signs or physical exam conducted as visit was done via telephone  Assessment and Plan: 1. Essential hypertension Patient's recent cardiology note reviewed and  patient initially had elevated blood pressure of 160/100 but after recheck blood pressure was 140/92.  He will continue his current medications including Avapro and metoprolol in addition to medications for atrial fibrillation including diltiazem and flecainide.  Low-sodium diet encouraged.  2. Paroxysmal atrial fibrillation ([place]) He reports no current issues with atrial fibrillation and EKG done at his recent cardiology visit did not show presence of atrial fibrillation.  However since his atrial fibrillation is intermittent, he should continue medications for control of heart rate as well as  continue medications for anticoagulation.  He will have BMP in follow-up of medication use and CBC in follow-up of anticoagulation. - CBC with Differential; Future  3. Chronic gout of multiple sites, unspecified cause Patient with chronic gout which has occurred in multiple sites.  He denies any acute gout flareups but he is having issues with some chronic left heel pain.  When patient comes in for blood work he will have a uric acid level done in follow-up.  In the meantime he will continue to remain hydrated, avoid foods which tend to trigger his gout flareups and continue use of allopurinol. - Uric Acid; Future  4. Heel pain, chronic, left Podiatry referral will be placed in follow-up of patient's chronic left heel pain.  Discussed with patient that heel pain could be gout related though less likely since he is not having increased warmth or swelling similar to prior gout attacks, differential would also include heel spur or plantar fasciitis as a cause of his heel pain.  Patient will be referred to podiatry for further evaluation and treatment and has been asked to come in for lab visit including uric acid level. - Ambulatory referral to Podiatry - Uric Acid; Future  5. History of sleep apnea Patient reports prior history of sleep apnea but moved prior to obtaining CPAP.  Patient will be scheduled for sleep study as sleep apnea may be contributing to patient's hypertension as well as his paroxysmal atrial fibrillation - Split night study; Future  6. Chronic anticoagulation Patient is currently on chronic anticoagulation with Eliquis due to paroxysmal atrial fibrillation and will have CBC to look for any anemia or other blood disorder associated with his chronic use of anticoagulant medication at upcoming lab visit - CBC with Differential; Future  7. Stage 2 chronic kidney disease Patient with mild chronic kidney disease with creatinine of 1.30 on BMP done 07/13/2018.  He is encouraged to  continue to remain hydrated, avoid the use of nonsteroidal anti-inflammatories and make sure the blood pressure remains controlled.  He will have BMP and CBC at upcoming lab visit in follow-up of chronic kidney disease - Basic Metabolic Panel; Future - CBC with Differential; Future  Follow Up Instructions:Return in about 5 months (around 02/22/2019) for HTN/afib-labs in 1 to 2 weeks.    I discussed the assessment and treatment plan with the patient. The patient was provided an opportunity to ask questions and all were answered. The patient agreed with the plan and demonstrated an understanding of the instructions.   The patient was advised to call back or seek an in-person evaluation if the symptoms worsen or if the condition fails to improve as anticipated.  I provided 14 minutes of non-face-to-face time during this encounter.   Antony Blackbird, MD

## 2018-09-27 ENCOUNTER — Other Ambulatory Visit: Payer: Managed Care, Other (non HMO)

## 2018-09-28 ENCOUNTER — Telehealth: Payer: Self-pay

## 2018-09-28 ENCOUNTER — Ambulatory Visit: Payer: Managed Care, Other (non HMO)

## 2018-09-28 ENCOUNTER — Other Ambulatory Visit: Payer: Self-pay

## 2018-09-28 ENCOUNTER — Ambulatory Visit: Payer: Managed Care, Other (non HMO) | Attending: Family Medicine | Admitting: Pharmacist

## 2018-09-28 VITALS — BP 158/92 | HR 80

## 2018-09-28 DIAGNOSIS — N182 Chronic kidney disease, stage 2 (mild): Secondary | ICD-10-CM

## 2018-09-28 DIAGNOSIS — G8929 Other chronic pain: Secondary | ICD-10-CM

## 2018-09-28 DIAGNOSIS — Z23 Encounter for immunization: Secondary | ICD-10-CM | POA: Diagnosis not present

## 2018-09-28 DIAGNOSIS — I10 Essential (primary) hypertension: Secondary | ICD-10-CM | POA: Diagnosis not present

## 2018-09-28 DIAGNOSIS — I480 Paroxysmal atrial fibrillation: Secondary | ICD-10-CM

## 2018-09-28 DIAGNOSIS — M1A09X0 Idiopathic chronic gout, multiple sites, without tophus (tophi): Secondary | ICD-10-CM

## 2018-09-28 DIAGNOSIS — Z7901 Long term (current) use of anticoagulants: Secondary | ICD-10-CM

## 2018-09-28 NOTE — Patient Instructions (Signed)
Thank you for coming to see [place] today.   Blood pressure today is elevated.  Start taking irbesartan at the same time before bedtime every day.   Continue other heart medications at the same time in the morning.   Limiting salt and caffeine, as well as exercising as able for at least 30 minutes for 5 days out of the week, can also help you lower your blood pressure.  Take your blood pressure at home if you are able. Please write down these numbers and bring them to your visits.  If you have any questions about medications, please call me [phone].  Lurena Joiner

## 2018-09-28 NOTE — Telephone Encounter (Signed)
Spoke to [place], [place] to check on status of referral for sleep study. She explained that his insurance denied the diagnostic study ordered in July. This order is for a split night study.  They will check insurance coverage and notify this office if there are any problems

## 2018-09-28 NOTE — Progress Notes (Signed)
   S:    PCP: Dr. Chapman Fitch  Patient arrives in good spirits. Presents to the clinic for hypertension evaluation, counseling, and management.  Patient was referred and last seen by Primary Care Provider on 09/22/18.   Patient reports adherence with medications.  Patient denies chest pain or pressure, palpitations, or dyspnea. No HA or blurred vision.   Current BP Medications include:  diltiazem 300 mg XT, irbesartan 300 mg daily, Toprol XL 100 mg daily.  Dietary habits include: "I don't each salt"; denies drinking caffeine  Exercise habits include: denies Family / Social history:  - Former smoker; uses chewing tobacco; "I have a dip in now" - Alcohol: admits to a couple of beers weekly   O:  Home BP readings: not checking regularly   Last 3 Office BP readings: BP Readings from Last 3 Encounters:  09/29/18 (!) 158/92  08/31/18 (!) 160/100  07/25/18 128/86    BMET    Component Value Date/Time   NA 142 09/28/2018 1000   K 4.6 09/28/2018 1000   CL 105 09/28/2018 1000   CO2 22 09/28/2018 1000   GLUCOSE 85 09/28/2018 1000   BUN 12 09/28/2018 1000   CREATININE 1.27 09/28/2018 1000   CALCIUM 9.4 09/28/2018 1000   GFRNONAA 59 (L) 09/28/2018 1000   GFRAA 69 09/28/2018 1000   Renal function: CrCl cannot be calculated (Unknown ideal weight.).  Clinical ASCVD: No  The ASCVD Risk score Mikey Bussing DC Jr., et al., 2013) failed to calculate for the following reasons:   Unable to determine if patient is Non-Hispanic African American   A/P: Hypertension longstanding/newly diagnosed currently uncontrolled on current medications. BP Goal = <130/80 mmHg. Patient is adherent with current medications. Encouraged pt to not use tobacco during encounters or within 30 minutes before encounters.  -Continued current regimen. Move irbesartan to bedtime.  -Counseled on lifestyle modifications for blood pressure control including reduced dietary sodium, increased exercise, adequate sleep -HM: flu shot  given  Results reviewed and written information provided.   Total time in face-to-face counseling 15 minutes.   F/U Clinic Visit with PCP.    Patient seen with:  Dillard Essex PharmD Candidate  Class of 2022 [place], PharmD, [place] [phone]

## 2018-09-29 ENCOUNTER — Encounter: Payer: Self-pay | Admitting: Pharmacist

## 2018-09-29 LAB — BASIC METABOLIC PANEL WITH GFR
BUN/Creatinine Ratio: 9 — ABNORMAL LOW (ref 10–24)
BUN: 12 mg/dL (ref 8–27)
CO2: 22 mmol/L (ref 20–29)
Calcium: 9.4 mg/dL (ref 8.6–10.2)
Chloride: 105 mmol/L (ref 96–106)
Creatinine, Ser: 1.27 mg/dL (ref 0.76–1.27)
GFR calc Af Amer: 69 mL/min/1.73
GFR calc non Af Amer: 59 mL/min/1.73 — ABNORMAL LOW
Glucose: 85 mg/dL (ref 65–99)
Potassium: 4.6 mmol/L (ref 3.5–5.2)
Sodium: 142 mmol/L (ref 134–144)

## 2018-09-29 LAB — CBC WITH DIFFERENTIAL/PLATELET
Basophils Absolute: 0.1 x10E3/uL (ref 0.0–0.2)
Basos: 1 %
EOS (ABSOLUTE): 0.2 x10E3/uL (ref 0.0–0.4)
Eos: 3 %
Hematocrit: 45.5 % (ref 37.5–51.0)
Hemoglobin: 15 g/dL (ref 13.0–17.7)
Immature Grans (Abs): 0 x10E3/uL (ref 0.0–0.1)
Immature Granulocytes: 0 %
Lymphocytes Absolute: 2.9 x10E3/uL (ref 0.7–3.1)
Lymphs: 46 %
MCH: 28 pg (ref 26.6–33.0)
MCHC: 33 g/dL (ref 31.5–35.7)
MCV: 85 fL (ref 79–97)
Monocytes Absolute: 0.6 x10E3/uL (ref 0.1–0.9)
Monocytes: 9 %
Neutrophils Absolute: 2.6 x10E3/uL (ref 1.4–7.0)
Neutrophils: 41 %
Platelets: 254 x10E3/uL (ref 150–450)
RBC: 5.36 x10E6/uL (ref 4.14–5.80)
RDW: 14.5 % (ref 11.6–15.4)
WBC: 6.3 x10E3/uL (ref 3.4–10.8)

## 2018-09-29 LAB — URIC ACID: Uric Acid: 3.4 mg/dL — ABNORMAL LOW (ref 3.7–8.6)

## 2018-10-02 MED FILL — METOPROLOL SUCCINATE ER 100: 100 | 30 days supply | Qty: 30 | Fill #1

## 2018-10-05 ENCOUNTER — Encounter: Payer: Self-pay | Admitting: Podiatry

## 2018-10-05 ENCOUNTER — Ambulatory Visit (INDEPENDENT_AMBULATORY_CARE_PROVIDER_SITE_OTHER): Payer: Managed Care, Other (non HMO) | Admitting: Podiatry

## 2018-10-05 ENCOUNTER — Ambulatory Visit (INDEPENDENT_AMBULATORY_CARE_PROVIDER_SITE_OTHER): Payer: Managed Care, Other (non HMO)

## 2018-10-05 ENCOUNTER — Other Ambulatory Visit: Payer: Self-pay

## 2018-10-05 VITALS — BP 180/112 | HR 83 | Resp 16

## 2018-10-05 DIAGNOSIS — M722 Plantar fascial fibromatosis: Secondary | ICD-10-CM

## 2018-10-05 MED ORDER — METHYLPREDNISOLONE 4 MG PO TBPK: ORAL_TABLET | ORAL | 0 refills | Status: DC

## 2018-10-05 MED FILL — METHYLPREDNISOLONE 4 MG TBP: 4 | 6 days supply | Qty: 21 | Fill #0

## 2018-10-05 NOTE — Progress Notes (Signed)
Subjective:  Patient ID: Albert Bon., male    DOB: [birth year],  MRN: [id number] HPI Chief Complaint  Patient presents with  . Foot Pain    Plantar heel left - aching x 1 month, history of PF years ago, AM pain, no treatment  . New Patient (Initial Visit)    63 y.o. male presents with the above complaint.   ROS: Denies fever chills nausea vomiting muscle aches pains calf pain back pain chest pain shortness of breath.  Past Medical History:  Diagnosis Date  . BPH associated with nocturia 03/12/2018  . BPH with obstruction/lower urinary tract symptoms   . Chronic gout of multiple sites 03/12/2018  . Essential hypertension   . Gout   . Long-term use of aspirin therapy 03/12/2018  . OSA (obstructive sleep apnea)   . Paroxysmal atrial fibrillation [facility])    Past Surgical History:  Procedure Laterality Date  . LIPOSUCTION      Current Outpatient Medications:  .  allopurinol (ZYLOPRIM) 300 MG tablet, Take 1 tablet (300 mg total) by mouth daily. To treat gout, Disp: 90 tablet, Rfl: 1 .  apixaban (ELIQUIS) 5 MG TABS tablet, Take 1 tablet (5 mg total) by mouth 2 (two) times daily., Disp: 60 tablet, Rfl: 11 .  colchicine 0.6 MG tablet, Take 1 tablet (0.6 mg total) by mouth daily. To treat gout, Disp: 90 tablet, Rfl: 1 .  diltiazem (CARTIA XT) 300 MG 24 hr capsule, Take 1 capsule (300 mg total) by mouth daily., Disp: 90 capsule, Rfl: 1 .  flecainide (TAMBOCOR) 50 MG tablet, Take 1 tablet (50 mg total) by mouth daily., Disp: 90 tablet, Rfl: 3 .  irbesartan (AVAPRO) 300 MG tablet, Take 1 tablet (300 mg total) by mouth daily., Disp: 30 tablet, Rfl: 2 .  methylPREDNISolone (MEDROL DOSEPAK) 4 MG TBPK tablet, 6 day dose pack - take as directed, Disp: 21 tablet, Rfl: 0 .  metoprolol succinate (TOPROL-XL) 100 MG 24 hr tablet, Take 1 tablet (100 mg total) by mouth daily. Take with or immediately following a meal., Disp: 90 tablet, Rfl: 1 .  tamsulosin (FLOMAX) 0.4 MG CAPS capsule, Take 1 capsule  (0.4 mg total) by mouth at bedtime., Disp: 90 capsule, Rfl: 1  Allergies  Allergen Reactions  . Other Hives    HAIR DYE   Review of Systems Objective:   Vitals:   10/05/18 1112  BP: (!) 180/112  Pulse: 83  Resp: 16    General: Well developed, nourished, in no acute distress, alert and oriented x3   Dermatological: Skin is warm, dry and supple bilateral. Nails x 10 are well maintained; remaining integument appears unremarkable at this time. There are no open sores, no preulcerative lesions, no rash or signs of infection present.  Vascular: Dorsalis Pedis artery and Posterior Tibial artery pedal pulses are 2/4 bilateral with immedate capillary fill time. Pedal hair growth present. No varicosities and no lower extremity edema present bilateral.   Neruologic: Grossly intact via light touch bilateral. Vibratory intact via tuning fork bilateral. Protective threshold with Semmes Wienstein monofilament intact to all pedal sites bilateral. Patellar and Achilles deep tendon reflexes 2+ bilateral. No Babinski or clonus noted bilateral.   Musculoskeletal: No gross boney pedal deformities bilateral. No pain, crepitus, or limitation noted with foot and ankle range of motion bilateral. Muscular strength 5/5 in all groups tested bilateral.  Gait: Unassisted, Nonantalgic.    Radiographs:  Radiographs taken today demonstrate an osseously mature individual with mild pes planus soft tissue increase  in density plantar fashion calcaneal insertion site no other acute osseous abnormalities.  Soft tissue margins appear to be normal otherwise.  Assessment & Plan:   Assessment: Plantar fasciitis left.  Plan: Discussed etiology pathology conservative versus surgical therapies at this point extra Betadine skin prep I injected 20 mg Kenalog 5 mg Marcaine point maximal tenderness of the left heel.  Tolerated procedure without complications.  Start him on a Medrol Dosepak.  Did not use NSAIDs because of his  heart condition and blood thinner.  Also provide him with plan fascial brace and a night splint.  Discussed appropriate shoe gear stretching exercise ice therapy and shoe gear modifications.     Max T. [place], [place]

## 2018-10-05 NOTE — Patient Instructions (Signed)

## 2018-10-13 MED FILL — FLECAINIDE ACETATE 50 MG TA: 50 | 30 days supply | Qty: 30 | Fill #3

## 2018-10-13 MED FILL — TAMSULOSIN HCL 0.4 MG CAP: 0.4 | 30 days supply | Qty: 30 | Fill #3

## 2018-10-13 MED FILL — ELIQUIS 5 MG TABLET: 5 | 30 days supply | Qty: 60 | Fill #1

## 2018-10-18 MED FILL — ALLOPURINOL 300 MG TAB: 300 | 30 days supply | Qty: 30 | Fill #3

## 2018-10-18 MED FILL — IRBESARTAN 300 MG TABS: 300 | 30 days supply | Qty: 30 | Fill #0

## 2018-10-18 MED FILL — CARTIA XT 300 MG CAPSULE SA: 300 | 30 days supply | Qty: 30 | Fill #3

## 2018-10-18 MED FILL — COLCHICINE 0.6 MG TABS: 0.6 | 30 days supply | Qty: 30 | Fill #3

## 2018-10-23 ENCOUNTER — Telehealth: Payer: Self-pay | Admitting: Family Medicine

## 2018-10-23 NOTE — Telephone Encounter (Signed)
Terry from the sleep cntr called to inform that [place] denied the Sleep study. Please contact [employer] if possible to provide additional information for review [phone]  [id number].  Sleep [phone] p

## 2018-10-23 NOTE — Telephone Encounter (Signed)
Staff called [employer] to get reason as to why patient Sleep Study was denied. Per Collie Siad, patient use to live in [place] and had completed a sleep study test before and patient did not get the CPAP machine and patient is only needing to get the machine. Per Collie Siad the note they received is stating that they are needing to obtain a repeat sleep study test to obtain CPAP machine. Per Collie Siad, the test was done not that long ago and staff need to reach out to patient to get more information to figure out when patient test was. Staff contacted patient and he stated that he did that sleep test about 2.5 years ago and he was waiting for the CPAP machine and never got it shipped to his house. Staff informed patient to please reach out to the facility that referred him to get the test and see if they have the results so we can provide that result to our provider here so patient can obtain his CPAP machine. Patient verbalized understanding and agreed with advise. Per pt he will update staff as to what's going on he just have to remember who referred him to get the sleep study test first.

## 2018-10-31 MED FILL — METOPROLOL SUCCINATE ER 100: 100 | 30 days supply | Qty: 30 | Fill #2

## 2018-11-07 ENCOUNTER — Ambulatory Visit: Payer: Managed Care, Other (non HMO) | Admitting: Podiatry

## 2018-11-10 MED FILL — ELIQUIS 5 MG TABLET: 5 | 30 days supply | Qty: 60 | Fill #2

## 2018-11-10 MED FILL — FLECAINIDE ACETATE 50 MG TA: 50 | 30 days supply | Qty: 30 | Fill #4

## 2018-11-19 ENCOUNTER — Encounter: Payer: Self-pay | Admitting: Family Medicine

## 2018-11-20 MED FILL — TAMSULOSIN HCL 0.4 MG CAP: 0.4 | 30 days supply | Qty: 30 | Fill #4

## 2018-11-20 MED FILL — IRBESARTAN 300 MG TABS: 300 | 30 days supply | Qty: 30 | Fill #1

## 2018-11-20 MED FILL — CARTIA XT 300 MG CAPSULE SA: 300 | 30 days supply | Qty: 30 | Fill #4

## 2018-11-20 MED FILL — ALLOPURINOL 300 MG TAB: 300 | 30 days supply | Qty: 30 | Fill #4

## 2018-11-20 MED FILL — COLCHICINE 0.6 MG TABS: 0.6 | 30 days supply | Qty: 30 | Fill #4

## 2018-11-30 NOTE — Progress Notes (Signed)
Cardiology Office Note   Date:  12/01/2018   ID:  Albert Elstad., DOB [birth year], MRN [id number]  PCP:  Antony Blackbird, MD  Cardiologist:   Dorris Carnes, MD       History of Present Illness: Albert Leconte. is a 63 y.o. male with a history of  PAF, HTN, OSA not on CPAP seen for follow up.   The pt was followed by Dr.Weissman at [facility] in [place]>>Cardioverted 2-3x>>Startedflecainide. Hx of afib 2-3 times / year.  No ischemia by stress test in 2016. Normal LVEF by echo 03/2016.  Established cardiac care with Dr. Harrington Challenger 05/05/2018. CHADSVASCs score of 1 for HTN. Plan to start NOAC when turns to 63. Previously on Eliquis.   Seen by me 07/2018 for chest pain and palpitations. Follow up stress test is low risk study. Pending event monitor.   Here today for follow up.  Patient reports no recurrent chest pain or palpitation.  Today is the last day for monitor.  He thinks his palpitation was due to intermittent high blood pressure.  He takes his oral antihypertensive regimen in the morning but not at schedule time.  Sometimes early and sometimes late in the afternoon.  I have advised him to take all antihypertensive around the same time each day.  Keep a log of blood pressure.  He saw B Bhagat in Aug 2020    Since seen he has done OK  Rare palpitations   No CP   No SOB  He has not had sleep study here   Had in 2017 or 2018 at [facility] in [place].   Current Meds  Medication Sig  . allopurinol (ZYLOPRIM) 300 MG tablet Take 1 tablet (300 mg total) by mouth daily. To treat gout  . apixaban (ELIQUIS) 5 MG TABS tablet Take 1 tablet (5 mg total) by mouth 2 (two) times daily.  . colchicine 0.6 MG tablet Take 1 tablet (0.6 mg total) by mouth daily. To treat gout  . diltiazem (CARTIA XT) 300 MG 24 hr capsule Take 1 capsule (300 mg total) by mouth daily.  . flecainide (TAMBOCOR) 50 MG tablet Take 1 tablet (50 mg total) by mouth daily.  . irbesartan (AVAPRO) 300 MG tablet  Take 1 tablet (300 mg total) by mouth daily.  . methylPREDNISolone (MEDROL DOSEPAK) 4 MG TBPK tablet 6 day dose pack - take as directed  . metoprolol succinate (TOPROL-XL) 100 MG 24 hr tablet Take 1 tablet (100 mg total) by mouth daily. Take with or immediately following a meal.  . tamsulosin (FLOMAX) 0.4 MG CAPS capsule Take 1 capsule (0.4 mg total) by mouth at bedtime.     Allergies:   Other   Past Medical History:  Diagnosis Date  . BPH associated with nocturia 03/12/2018  . BPH with obstruction/lower urinary tract symptoms   . Chronic gout of multiple sites 03/12/2018  . Essential hypertension   . Gout   . Long-term use of aspirin therapy 03/12/2018  . OSA (obstructive sleep apnea)   . Paroxysmal atrial fibrillation [facility])     Past Surgical History:  Procedure Laterality Date  . LIPOSUCTION       Social History:  The patient  reports that he has quit smoking. His smokeless tobacco use includes chew. He reports previous alcohol use. He reports previous drug use.   Family History:  The patient's family history includes Diabetes in his brother, mother, and sister; Heart failure in his brother; Hypertension in his father; Kidney failure  in his brother.    ROS:  Please see the history of present illness. All other systems are reviewed and  Negative to the above problem except as noted.    PHYSICAL EXAM: VS:  BP (!) 146/86   Pulse 77   Ht 6\' 2"  (1.88 m)   Wt 222 lb (100.7 kg)   SpO2 97%   BMI 28.50 kg/m   GEN: Well nourished, well developed, in no acute distress  HEENT: normal  Neck: no JVD, carotid bruits, or masses Cardiac: RRR; no murmurs, rubs, or gallops,  NO  edema  Respiratory:  clear to auscultation bilaterally, normal work of breathing GI: soft, nontender, nondistended, + BS  No hepatomegaly  MS: no deformity Moving all extremities   Skin: warm and dry, no rash Neuro:  Strength and sensation are intact Psych: euthymic mood, full affect   EKG:  EKG is not ordered  today.   Lipid Panel    Component Value Date/Time   CHOL 183 03/09/2018 1126   TRIG 74 03/09/2018 1126   HDL 64 03/09/2018 1126   CHOLHDL 2.9 03/09/2018 1126   LDLCALC 104 (H) 03/09/2018 1126      Wt Readings from Last 3 Encounters:  12/01/18 222 lb (100.7 kg)  08/31/18 229 lb 1.9 oz (103.9 kg)  08/08/18 226 lb (102.5 kg)      ASSESSMENT AND PLAN: 1  PAF  Doing well  No real spells  NOw on Eliquis      2  HTN   BP is not optimally controlled   Will try to get sleep study   Hold on medicine changes for now  3  OSA  Pt had study in [place] about 2 years ago  Told had mod OSA   WIll get records   Try to use to get CPAP    Follow up late spring     Current medicines are reviewed at length with the patient today.  The patient does not have concerns regarding medicines.  Signed, Dorris Carnes, MD  12/01/2018 9:11 AM    [facility] [address], [place], [place]  [address] Phone: [phone]; Fax: [phone]

## 2018-12-01 ENCOUNTER — Ambulatory Visit (INDEPENDENT_AMBULATORY_CARE_PROVIDER_SITE_OTHER): Payer: Managed Care, Other (non HMO) | Admitting: Internal Medicine

## 2018-12-01 ENCOUNTER — Other Ambulatory Visit: Payer: Self-pay

## 2018-12-01 ENCOUNTER — Encounter: Payer: Self-pay | Admitting: Internal Medicine

## 2018-12-01 VITALS — BP 146/86 | HR 77 | Ht 74.0 in | Wt 222.0 lb

## 2018-12-01 DIAGNOSIS — I10 Essential (primary) hypertension: Secondary | ICD-10-CM | POA: Diagnosis not present

## 2018-12-01 DIAGNOSIS — I480 Paroxysmal atrial fibrillation: Secondary | ICD-10-CM | POA: Diagnosis not present

## 2018-12-01 NOTE — Patient Instructions (Signed)
Medication Instructions:  No changes *If you need a refill on your cardiac medications before your next appointment, please call your pharmacy*  Lab Work: none If you have labs (blood work) drawn today and your tests are completely normal, you will receive your results only by: [facility] Kitchen MyChart Message (if you have MyChart) OR . A paper copy in the mail If you have any lab test that is abnormal or we need to change your treatment, we will call you to review the results.  Testing/Procedures: nonee  Follow-Up: At [facility], you and your health needs are our priority.  As part of our continuing mission to provide you with exceptional heart care, we have created designated Provider Care Teams.  These Care Teams include your primary Cardiologist (physician) and Advanced Practice Providers (APPs -  Physician Assistants and Nurse Practitioners) who all work together to provide you with the care you need, when you need it.  Your next appointment:   6 month(s)  The format for your next appointment:   In Person  Provider:   Dorris Carnes, MD  Other Instructions We will obtain your sleep study from [employer] [facility])

## 2018-12-04 MED FILL — METOPROLOL SUCCINATE ER 100: 100 | 30 days supply | Qty: 30 | Fill #3

## 2018-12-11 MED FILL — FLECAINIDE ACETATE 50 MG TA: 50 | 30 days supply | Qty: 30 | Fill #5

## 2018-12-11 MED FILL — ELIQUIS 5 MG TABLET: 5 | 30 days supply | Qty: 60 | Fill #3

## 2018-12-14 MED FILL — TAMSULOSIN HCL 0.4 MG CAP: 0.4 | 30 days supply | Qty: 30 | Fill #5

## 2018-12-14 MED FILL — IRBESARTAN 300 MG TABS: 300 | 30 days supply | Qty: 30 | Fill #2

## 2018-12-14 MED FILL — CARTIA XT 300 MG CAPSULE SA: 300 | 30 days supply | Qty: 30 | Fill #5

## 2018-12-14 MED FILL — ALLOPURINOL 300 MG TAB: 300 | 30 days supply | Qty: 30 | Fill #5

## 2018-12-25 MED FILL — COLCHICINE 0.6 MG TABS: 0.6 | 30 days supply | Qty: 30 | Fill #5

## 2019-01-01 MED FILL — METOPROLOL SUCCINATE ER 100: 100 | 30 days supply | Qty: 30 | Fill #4

## 2019-01-11 MED FILL — FLECAINIDE ACETATE 50 MG TA: 50 | 30 days supply | Qty: 30 | Fill #6

## 2019-01-11 MED FILL — TAMSULOSIN HCL 0.4 MG CAP: 0.4 | 30 days supply | Qty: 30 | Fill #4

## 2019-01-11 MED FILL — ELIQUIS 5 MG TABLET: 5 | 30 days supply | Qty: 60 | Fill #4

## 2019-01-15 ENCOUNTER — Other Ambulatory Visit: Payer: Self-pay | Admitting: Physician Assistant

## 2019-01-15 MED FILL — COLCHICINE 0.6 MG TABS: 0.6 | 30 days supply | Qty: 30 | Fill #3

## 2019-01-15 MED FILL — IRBESARTAN 300 MG TABS: 300 | 30 days supply | Qty: 30 | Fill #0

## 2019-01-15 MED FILL — ALLOPURINOL 300 MG TAB: 300 | 30 days supply | Qty: 30 | Fill #4

## 2019-01-15 MED FILL — CARTIA XT 300 MG CAPSULE SA: 300 | 30 days supply | Qty: 30 | Fill #0

## 2019-01-30 MED FILL — METOPROLOL SUCCINATE ER 100: 100 | 30 days supply | Qty: 30 | Fill #5

## 2019-02-09 MED FILL — ELIQUIS 5 MG TABLET: 5 | 30 days supply | Qty: 60 | Fill #5

## 2019-02-09 MED FILL — FLECAINIDE ACETATE 50 MG TA: 50 | 30 days supply | Qty: 30 | Fill #7

## 2019-02-09 MED FILL — TAMSULOSIN HCL 0.4 MG CAP: 0.4 | 30 days supply | Qty: 30 | Fill #5

## 2019-02-16 MED FILL — IRBESARTAN 300 MG TABS: 300 | 30 days supply | Qty: 30 | Fill #1

## 2019-02-16 MED FILL — ALLOPURINOL 300 MG TAB: 300 | 30 days supply | Qty: 30 | Fill #5

## 2019-02-16 MED FILL — CARTIA XT 300 MG CAPSULE SA: 300 | 30 days supply | Qty: 30 | Fill #1

## 2019-03-02 MED FILL — METOPROLOL SUCCINATE ER 100: 100 | 30 days supply | Qty: 30 | Fill #3

## 2019-03-07 MED FILL — ELIQUIS 5 MG TABLET: 5 | 30 days supply | Qty: 60 | Fill #6

## 2019-03-16 ENCOUNTER — Other Ambulatory Visit: Payer: Self-pay | Admitting: Family Medicine

## 2019-03-16 DIAGNOSIS — R351 Nocturia: Secondary | ICD-10-CM

## 2019-03-16 DIAGNOSIS — N401 Enlarged prostate with lower urinary tract symptoms: Secondary | ICD-10-CM

## 2019-03-16 DIAGNOSIS — M1A09X0 Idiopathic chronic gout, multiple sites, without tophus (tophi): Secondary | ICD-10-CM

## 2019-03-16 MED FILL — CARTIA XT 300 MG CAPSULE SA: 300 | 90 days supply | Qty: 90 | Fill #2

## 2019-03-16 MED FILL — FLECAINIDE ACETATE 50 MG TA: 50 | 90 days supply | Qty: 90 | Fill #8

## 2019-03-16 MED FILL — IRBESARTAN 300 MG TABS: 300 | 90 days supply | Qty: 90 | Fill #2

## 2019-03-20 ENCOUNTER — Other Ambulatory Visit: Payer: Self-pay | Admitting: Family Medicine

## 2019-03-20 DIAGNOSIS — M1A09X0 Idiopathic chronic gout, multiple sites, without tophus (tophi): Secondary | ICD-10-CM

## 2019-03-20 DIAGNOSIS — R351 Nocturia: Secondary | ICD-10-CM

## 2019-03-20 DIAGNOSIS — N401 Enlarged prostate with lower urinary tract symptoms: Secondary | ICD-10-CM

## 2019-03-21 ENCOUNTER — Other Ambulatory Visit: Payer: Self-pay

## 2019-03-21 ENCOUNTER — Ambulatory Visit: Payer: Self-pay | Attending: Family Medicine | Admitting: Family Medicine

## 2019-03-21 ENCOUNTER — Other Ambulatory Visit: Payer: Self-pay | Admitting: Family Medicine

## 2019-03-21 ENCOUNTER — Encounter: Payer: Self-pay | Admitting: Family Medicine

## 2019-03-21 VITALS — BP 132/76 | HR 56 | Temp 97.7°F | Ht 74.0 in | Wt 231.8 lb

## 2019-03-21 DIAGNOSIS — M1A09X0 Idiopathic chronic gout, multiple sites, without tophus (tophi): Secondary | ICD-10-CM

## 2019-03-21 DIAGNOSIS — I480 Paroxysmal atrial fibrillation: Secondary | ICD-10-CM

## 2019-03-21 DIAGNOSIS — R351 Nocturia: Secondary | ICD-10-CM

## 2019-03-21 DIAGNOSIS — Z7901 Long term (current) use of anticoagulants: Secondary | ICD-10-CM

## 2019-03-21 DIAGNOSIS — Z79899 Other long term (current) drug therapy: Secondary | ICD-10-CM

## 2019-03-21 DIAGNOSIS — N182 Chronic kidney disease, stage 2 (mild): Secondary | ICD-10-CM

## 2019-03-21 DIAGNOSIS — N401 Enlarged prostate with lower urinary tract symptoms: Secondary | ICD-10-CM

## 2019-03-21 DIAGNOSIS — I10 Essential (primary) hypertension: Secondary | ICD-10-CM

## 2019-03-21 MED ORDER — METOPROLOL SUCCINATE ER 100 MG PO TB24: 100.0000 mg | ORAL_TABLET | Freq: Every day | ORAL | 3 refills | Status: DC

## 2019-03-21 MED ORDER — IRBESARTAN 300 MG PO TABS: 300.0000 mg | ORAL_TABLET | Freq: Every day | ORAL | 3 refills | Status: DC

## 2019-03-21 MED ORDER — ALLOPURINOL 300 MG PO TABS: 300.0000 mg | ORAL_TABLET | Freq: Every day | ORAL | 1 refills | Status: DC

## 2019-03-21 MED ORDER — TAMSULOSIN HCL 0.4 MG PO CAPS: 0.4000 mg | ORAL_CAPSULE | Freq: Every day | ORAL | 1 refills | Status: DC

## 2019-03-21 MED ORDER — DILTIAZEM HCL ER COATED BEADS 300 MG PO CP24: 300.0000 mg | ORAL_CAPSULE | Freq: Every day | ORAL | 3 refills | Status: DC

## 2019-03-21 MED ORDER — FLECAINIDE ACETATE 50 MG PO TABS: 50.0000 mg | ORAL_TABLET | Freq: Every day | ORAL | 3 refills | Status: DC

## 2019-03-21 MED FILL — TAMSULOSIN HCL 0.4 MG CAP: 0.4 | 90 days supply | Qty: 90 | Fill #0

## 2019-03-21 MED FILL — ALLOPURINOL 300 MG TAB: 300 | 90 days supply | Qty: 90 | Fill #0

## 2019-03-21 NOTE — Progress Notes (Signed)
Subjective:  Patient ID: Albert Bon., male    DOB: December 02, 1955  Age: 64 y.o. MRN: [id number]  CC: Medication Refill (allopurinol, colchicine,flomax, are all empty, unsure of any other refills)   HPI Albert Bon., 64 year old male, who is seen in follow-up of chronic medical issues including hypertension, gout, BPH, paroxysmal atrial fibrillation and long-term use of anticoagulant medication/Eliquis.  He reports that he needs refills on allopurinol, colchicine and Flomax.  He reports that he has gone 1 day without Flomax and feels as if he had increased urinary symptoms overnight.  He continues to take irbesartan, metoprolol, diltiazem and flecainide but believes that he will need additional refills.  He denies any headaches or dizziness related to his blood pressure.  He denies any chest pain or sensation of palpitations related to his atrial fibrillation.  He denies any issues with unusual bruising or bleeding related to his use of blood thinning medication.  He has noticed no blood/bleeding when brushing his teeth, no nosebleeds, no blood in the urine and no blood in the stools or with wiping after bowel movement.  He denies any black stools and has had no issues with abdominal pain.  He has had no recent issues with gout flareups.  Gout most commonly occurs in his great toes.  He continues to remain hydrated and avoids the use of nonsteroidal anti-inflammatories due to mild chronic kidney disease and because he is also on blood thinning medication.  Past Medical History:  Diagnosis Date  . BPH associated with nocturia 03/12/2018  . BPH with obstruction/lower urinary tract symptoms   . Chronic gout of multiple sites 03/12/2018  . Essential hypertension   . Gout   . Long-term use of aspirin therapy 03/12/2018  . OSA (obstructive sleep apnea)   . Paroxysmal atrial fibrillation [facility])     Past Surgical History:  Procedure Laterality Date  . LIPOSUCTION      Family History  Problem  Relation Age of Onset  . Diabetes Mother   . Hypertension Father   . Diabetes Sister   . Diabetes Brother   . Heart failure Brother   . Kidney failure Brother     Social History   Tobacco Use  . Smoking status: Former Research scientist (life sciences)  . Smokeless tobacco: Current User    Types: Chew  Substance Use Topics  . Alcohol use: Not Currently    ROS Review of Systems  Constitutional: Negative for chills, fatigue and fever.  HENT: Negative for nosebleeds, sore throat and trouble swallowing.   Eyes: Negative for photophobia and visual disturbance.  Respiratory: Negative for cough and shortness of breath.   Cardiovascular: Negative for chest pain, palpitations and leg swelling.  Gastrointestinal: Negative for abdominal pain, blood in stool, constipation, diarrhea and nausea.  Endocrine: Negative for cold intolerance, heat intolerance, polydipsia, polyphagia and polyuria.  Genitourinary: Negative for dysuria, frequency and hematuria.  Musculoskeletal: Negative for arthralgias and joint swelling.  Skin: Negative for rash and wound.  Neurological: Negative for dizziness and headaches.  Hematological: Negative for adenopathy. Does not bruise/bleed easily.    Objective:   Today's Vitals: BP 132/76   Pulse (!) 56   Temp 97.7 F (36.5 C)   Ht 6\' 2"  (1.88 m)   Wt 231 lb 12.8 oz (105.1 kg)   SpO2 96%   BMI 29.76 kg/m   Physical Exam Vitals and nursing note reviewed.  Constitutional:      General: He is not in acute distress.    Appearance: Normal  appearance.  Cardiovascular:     Rate and Rhythm: Normal rate and regular rhythm.  Pulmonary:     Effort: Pulmonary effort is normal.     Breath sounds: Normal breath sounds.  Abdominal:     Palpations: Abdomen is soft.     Tenderness: There is no abdominal tenderness. There is no right CVA tenderness, left CVA tenderness, guarding or rebound.  Musculoskeletal:        General: No swelling or tenderness.     Cervical back: Normal range of  motion and neck supple.     Right lower leg: No edema.     Left lower leg: No edema.  Lymphadenopathy:     Cervical: No cervical adenopathy.  Skin:    General: Skin is warm and dry.  Neurological:     General: No focal deficit present.     Mental Status: He is alert and oriented to person, place, and time.  Psychiatric:        Mood and Affect: Mood normal.        Behavior: Behavior normal.     Assessment & Plan:  1. Essential hypertension Blood pressure is controlled on patient's current medicines including Avapro and metoprolol which he is to continue.  Refill provided of metoprolol at today's visit.  Continue low-sodium diet.  Patient is additionally on medications for atrial fibrillation which can also help with his blood pressure. - metoprolol succinate (TOPROL-XL) 100 MG 24 hr tablet; Take 1 tablet (100 mg total) by mouth daily. Take with or immediately following a meal.  Dispense: 90 tablet; Refill: 3  2. Paroxysmal atrial fibrillation (HCC) On exam, patient appears to be in normal sinus rhythm and heart rate is controlled.  Refills provided for diltiazem, metoprolol and flecainide.  He is also encouraged to follow-up with his cardiologist.  He is to continue the use of blood thinning medication to help reduce the risk of embolic stroke.  He will have CBC in follow-up of long-term use of blood thinning medication. - CBC - diltiazem (CARTIA XT) 300 MG 24 hr capsule; Take 1 capsule (300 mg total) by mouth daily.  Dispense: 90 capsule; Refill: 3 - metoprolol succinate (TOPROL-XL) 100 MG 24 hr tablet; Take 1 tablet (100 mg total) by mouth daily. Take with or immediately following a meal.  Dispense: 90 tablet; Refill: 3 - flecainide (TAMBOCOR) 50 MG tablet; Take 1 tablet (50 mg total) by mouth daily.  Dispense: 90 tablet; Refill: 3  3. Stage 2 chronic kidney disease He has had prior mild increase in creatinine, most recently on 07/13/2018 with creatinine of 1.30.  He is aware that he  should remain hydrated and avoid the use of nonsteroidal anti-inflammatories not only due to his chronic kidney disease but also due to his long-term use of anticoagulant medication.  He will have repeat BMP at today's visit - Basic Metabolic Panel  4. BPH associated with nocturia He reports continued issues with nocturia with improvement with use of tamsulosin.  He reports no recent follow-up with urology therefore urology referral is also being placed at today's visit. - Ambulatory referral to Urology - tamsulosin (FLOMAX) 0.4 MG CAPS capsule; Take 1 capsule (0.4 mg total) by mouth at bedtime.  Dispense: 90 capsule; Refill: 1  5. Chronic anticoagulation Patient reports compliance with use of Eliquis and denies any issues with abnormal bruising or bleeding at this time.  Patient will have CBC to look for any anemia or platelet disorder related to his long-term use of  anticoagulant medication. - CBC  6. Chronic gout of multiple sites, unspecified cause Patient with chronic issues with gout but reports no recent flareups.  He will have refill of allopurinol at today's visit and discussed with patient that his most recent uric acid level done 09/28/2018 was at 3.4.  Uric acid levels of 6 or greater are associated with increased risk of gout flareup.  Continue low purine diet. - allopurinol (ZYLOPRIM) 300 MG tablet; Take 1 tablet (300 mg total) by mouth daily. To treat gout  Dispense: 90 tablet; Refill: 1  7. Encounter for long-term current use of medication He will have comprehensive metabolic panel in follow-up of long-term use of medications for the treatment of hypertension, paroxysmal atrial fibrillation and gout.   Outpatient Encounter Medications as of 03/21/2019  Medication Sig  . allopurinol (ZYLOPRIM) 300 MG tablet Take 1 tablet (300 mg total) by mouth daily. To treat gout  . apixaban (ELIQUIS) 5 MG TABS tablet Take 1 tablet (5 mg total) by mouth 2 (two) times daily.  . colchicine 0.6 MG  tablet Take 1 tablet (0.6 mg total) by mouth daily. To treat gout  . diltiazem (CARTIA XT) 300 MG 24 hr capsule Take 1 capsule (300 mg total) by mouth daily.  . flecainide (TAMBOCOR) 50 MG tablet Take 1 tablet (50 mg total) by mouth daily.  . irbesartan (AVAPRO) 300 MG tablet TAKE 1 TABLET (300 MG TOTAL) BY MOUTH DAILY.  . metoprolol succinate (TOPROL-XL) 100 MG 24 hr tablet Take 1 tablet (100 mg total) by mouth daily. Take with or immediately following a meal.  . tamsulosin (FLOMAX) 0.4 MG CAPS capsule Take 1 capsule (0.4 mg total) by mouth at bedtime.  . [DISCONTINUED] methylPREDNISolone (MEDROL DOSEPAK) 4 MG TBPK tablet 6 day dose pack - take as directed (Patient not taking: Reported on 03/21/2019)   No facility-administered encounter medications on file as of 03/21/2019.    An After Visit Summary was printed and given to the patient.   Follow-up: Return in about 4 months (around 07/21/2019) for chronic issues; sooner if needed.   30+ minutes spent in face-to-face time with the patient discussing his current medical issues, obtaining review of systems, performing examination, discussing his assessment and treatment plan, reviewing prior labs with the patient as well as additional time, approximately 12 minutes, placing orders for patient's blood work, medication refills, specialty referrals and completion of today's encounter note   Antony Blackbird MD

## 2019-03-22 ENCOUNTER — Other Ambulatory Visit: Payer: Self-pay | Admitting: Family Medicine

## 2019-03-22 DIAGNOSIS — M1A09X0 Idiopathic chronic gout, multiple sites, without tophus (tophi): Secondary | ICD-10-CM

## 2019-03-22 LAB — CBC
Hematocrit: 47.1 % (ref 37.5–51.0)
Hemoglobin: 15.5 g/dL (ref 13.0–17.7)
MCH: 28.3 pg (ref 26.6–33.0)
MCHC: 32.9 g/dL (ref 31.5–35.7)
MCV: 86 fL (ref 79–97)
Platelets: 240 x10E3/uL (ref 150–450)
RBC: 5.48 x10E6/uL (ref 4.14–5.80)
RDW: 14.5 % (ref 11.6–15.4)
WBC: 6.5 x10E3/uL (ref 3.4–10.8)

## 2019-03-22 LAB — BASIC METABOLIC PANEL WITH GFR
BUN/Creatinine Ratio: 8 — ABNORMAL LOW (ref 10–24)
BUN: 9 mg/dL (ref 8–27)
CO2: 24 mmol/L (ref 20–29)
Calcium: 9.6 mg/dL (ref 8.6–10.2)
Chloride: 103 mmol/L (ref 96–106)
Creatinine, Ser: 1.15 mg/dL (ref 0.76–1.27)
GFR calc Af Amer: 77 mL/min/1.73
GFR calc non Af Amer: 66 mL/min/1.73
Glucose: 85 mg/dL (ref 65–99)
Potassium: 4.1 mmol/L (ref 3.5–5.2)
Sodium: 142 mmol/L (ref 134–144)

## 2019-03-22 MED FILL — COLCHICINE 0.6 MG TABS: 0.6 | 30 days supply | Qty: 30 | Fill #0

## 2019-03-28 DIAGNOSIS — Z7901 Long term (current) use of anticoagulants: Secondary | ICD-10-CM | POA: Insufficient documentation

## 2019-03-30 MED FILL — METOPROLOL SUCCINATE ER 100: 100 | 30 days supply | Qty: 30 | Fill #4

## 2019-04-09 MED FILL — ELIQUIS 5 MG TABLET: 5 | 30 days supply | Qty: 60 | Fill #7

## 2019-04-30 MED FILL — METOPROLOL SUCCINATE ER 100: 100 | 30 days supply | Qty: 30 | Fill #5

## 2019-04-30 MED FILL — COLCHICINE 0.6 MG TABS: 0.6 | 30 days supply | Qty: 30 | Fill #1

## 2019-05-11 MED FILL — ELIQUIS 5 MG TABLET: 5 | 30 days supply | Qty: 60 | Fill #8

## 2019-06-01 ENCOUNTER — Other Ambulatory Visit: Payer: Self-pay | Admitting: Family Medicine

## 2019-06-01 DIAGNOSIS — M1A09X0 Idiopathic chronic gout, multiple sites, without tophus (tophi): Secondary | ICD-10-CM

## 2019-06-01 MED FILL — METOPROLOL SUCCINATE ER 100: 100 | 90 days supply | Qty: 90 | Fill #0

## 2019-06-01 MED FILL — COLCHICINE 0.6 MG TABS: 0.6 | 30 days supply | Qty: 30 | Fill #0

## 2019-06-12 MED FILL — ELIQUIS 5 MG TABLET: 5 | 30 days supply | Qty: 60 | Fill #9

## 2019-06-12 MED FILL — CARTIA XT 300 MG CAPSULE SA: 300 | 30 days supply | Qty: 30 | Fill #3

## 2019-06-12 MED FILL — ALLOPURINOL 300 MG TAB: 300 | 90 days supply | Qty: 90 | Fill #1

## 2019-06-12 MED FILL — TAMSULOSIN HCL 0.4 MG CAP: 0.4 | 90 days supply | Qty: 90 | Fill #1

## 2019-06-12 MED FILL — FLECAINIDE ACETATE 50 MG TA: 50 | 30 days supply | Qty: 30 | Fill #9

## 2019-06-12 MED FILL — IRBESARTAN 300 MG TABS: 300 | 90 days supply | Qty: 90 | Fill #3

## 2019-06-29 MED FILL — COLCHICINE 0.6 MG TABS: 0.6 | 30 days supply | Qty: 30 | Fill #1

## 2019-07-13 MED FILL — FLECAINIDE ACETATE 50 MG TA: 50 | 90 days supply | Qty: 90 | Fill #0

## 2019-07-13 MED FILL — CARTIA XT 300 MG CAPSULE SA: 300 | 90 days supply | Qty: 90 | Fill #0

## 2019-07-13 MED FILL — ELIQUIS 5 MG TABLET: 5 | 30 days supply | Qty: 60 | Fill #10

## 2019-07-23 ENCOUNTER — Ambulatory Visit: Payer: Self-pay | Admitting: Family Medicine

## 2019-07-27 ENCOUNTER — Other Ambulatory Visit: Payer: Self-pay | Admitting: Family Medicine

## 2019-07-27 DIAGNOSIS — M1A09X0 Idiopathic chronic gout, multiple sites, without tophus (tophi): Secondary | ICD-10-CM

## 2019-07-27 MED FILL — COLCHICINE 0.6 MG TABS: 0.6 | 30 days supply | Qty: 30 | Fill #0

## 2019-08-13 MED FILL — ELIQUIS 5 MG TABLET: 5 | 30 days supply | Qty: 60 | Fill #11

## 2019-08-27 MED FILL — METOPROLOL SUCCINATE ER 100: 100 | 90 days supply | Qty: 90 | Fill #1

## 2019-08-27 MED FILL — COLCHICINE 0.6 MG TABS: 0.6 | 30 days supply | Qty: 30 | Fill #1

## 2019-08-30 ENCOUNTER — Encounter: Payer: Self-pay | Admitting: Internal Medicine

## 2019-08-30 ENCOUNTER — Other Ambulatory Visit: Payer: Self-pay

## 2019-08-30 ENCOUNTER — Encounter: Payer: Self-pay | Admitting: Physician Assistant

## 2019-08-30 ENCOUNTER — Ambulatory Visit: Payer: Medicare HMO | Attending: Family Medicine | Admitting: Physician Assistant

## 2019-08-30 ENCOUNTER — Ambulatory Visit (INDEPENDENT_AMBULATORY_CARE_PROVIDER_SITE_OTHER): Payer: 59 | Admitting: Internal Medicine

## 2019-08-30 ENCOUNTER — Other Ambulatory Visit: Payer: Self-pay | Admitting: Physician Assistant

## 2019-08-30 VITALS — BP 134/90 | HR 54 | Ht 74.0 in | Wt 226.2 lb

## 2019-08-30 DIAGNOSIS — R0683 Snoring: Secondary | ICD-10-CM

## 2019-08-30 DIAGNOSIS — R351 Nocturia: Secondary | ICD-10-CM

## 2019-08-30 DIAGNOSIS — I10 Essential (primary) hypertension: Secondary | ICD-10-CM | POA: Diagnosis not present

## 2019-08-30 DIAGNOSIS — I480 Paroxysmal atrial fibrillation: Secondary | ICD-10-CM

## 2019-08-30 DIAGNOSIS — Z1329 Encounter for screening for other suspected endocrine disorder: Secondary | ICD-10-CM

## 2019-08-30 DIAGNOSIS — Z1321 Encounter for screening for nutritional disorder: Secondary | ICD-10-CM

## 2019-08-30 DIAGNOSIS — M1A09X0 Idiopathic chronic gout, multiple sites, without tophus (tophi): Secondary | ICD-10-CM | POA: Diagnosis not present

## 2019-08-30 DIAGNOSIS — M25512 Pain in left shoulder: Secondary | ICD-10-CM | POA: Diagnosis not present

## 2019-08-30 DIAGNOSIS — N401 Enlarged prostate with lower urinary tract symptoms: Secondary | ICD-10-CM

## 2019-08-30 MED ORDER — FLECAINIDE ACETATE 50 MG PO TABS: 50.0000 mg | ORAL_TABLET | Freq: Every day | ORAL | 3 refills | Status: DC

## 2019-08-30 MED ORDER — IRBESARTAN 300 MG PO TABS: 300.0000 mg | ORAL_TABLET | Freq: Every day | ORAL | 3 refills | Status: DC

## 2019-08-30 MED ORDER — METHOCARBAMOL 500 MG PO TABS: 500.0000 mg | ORAL_TABLET | Freq: Four times a day (QID) | ORAL | 0 refills | Status: DC

## 2019-08-30 MED ORDER — TAMSULOSIN HCL 0.4 MG PO CAPS: 0.4000 mg | ORAL_CAPSULE | Freq: Every day | ORAL | 1 refills | Status: DC

## 2019-08-30 MED ORDER — DILTIAZEM HCL ER COATED BEADS 300 MG PO CP24: 300.0000 mg | ORAL_CAPSULE | Freq: Every day | ORAL | 3 refills | Status: DC

## 2019-08-30 MED ORDER — COLCHICINE 0.6 MG PO TABS: 0.6000 mg | ORAL_TABLET | Freq: Every day | ORAL | 1 refills | Status: DC

## 2019-08-30 MED ORDER — ALLOPURINOL 300 MG PO TABS: 300.0000 mg | ORAL_TABLET | Freq: Every day | ORAL | 1 refills | Status: DC

## 2019-08-30 MED ORDER — APIXABAN 5 MG PO TABS: 5.0000 mg | ORAL_TABLET | Freq: Two times a day (BID) | ORAL | 11 refills | Status: DC

## 2019-08-30 MED ORDER — METOPROLOL SUCCINATE ER 100 MG PO TB24: 100.0000 mg | ORAL_TABLET | Freq: Every day | ORAL | 3 refills | Status: DC

## 2019-08-30 MED FILL — IRBESARTAN 300 MG TABS: 300 | 90 days supply | Qty: 90 | Fill #0

## 2019-08-30 MED FILL — ALLOPURINOL 300 MG TAB: 300 | 90 days supply | Qty: 90 | Fill #0

## 2019-08-30 NOTE — Progress Notes (Signed)
Virtual Visit via Telephone Note  I connected with Albert Good. on 08/30/19 at  3:10 PM EDT by telephone and verified that I am speaking with the correct person using two identifiers.   I discussed the limitations, risks, security and privacy concerns of performing an evaluation and management service by telephone and the availability of in person appointments. I also discussed with the patient that there may be a patient responsible charge related to this service. The patient expressed understanding and agreed to proceed.  PATIENT visit by telephone virtually in the context of Covid-19 pandemic. Patient location: home  My Location:  [place] office Persons on the call: me and the patient   History of Present Illness: Saw cardiology today.  No CP.  They ordered sleep study.  He needs RF on meds. Labs drawn there today. Awakened about 5 days ago with L shoulder pain.  He is L hand dominant.  NKI but thinks he slept wrong.  He would like to be referred to chiropractor and ortho.  Also needs urology referral for BPH.     Observations/Objective:  NAD.  A&Ox3   Assessment and Plan: 1. Chronic gout of multiple sites, unspecified cause - allopurinol (ZYLOPRIM) 300 MG tablet; Take 1 tablet (300 mg total) by mouth daily. To treat gout  Dispense: 90 tablet; Refill: 1 - colchicine 0.6 MG tablet; Take 1 tablet (0.6 mg total) by mouth daily. To treat gout  Dispense: 30 tablet; Refill: 1  2. Paroxysmal atrial fibrillation (HCC) - diltiazem (CARTIA XT) 300 MG 24 hr capsule; Take 1 capsule (300 mg total) by mouth daily.  Dispense: 90 capsule; Refill: 3 - flecainide (TAMBOCOR) 50 MG tablet; Take 1 tablet (50 mg total) by mouth daily.  Dispense: 90 tablet; Refill: 3 - metoprolol succinate (TOPROL-XL) 100 MG 24 hr tablet; Take 1 tablet (100 mg total) by mouth daily. Take with or immediately following a meal.  Dispense: 90 tablet; Refill: 3  3. Essential hypertension - diltiazem (CARTIA XT) 300 MG 24  hr capsule; Take 1 capsule (300 mg total) by mouth daily.  Dispense: 90 capsule; Refill: 3 - irbesartan (AVAPRO) 300 MG tablet; Take 1 tablet (300 mg total) by mouth daily.  Dispense: 90 tablet; Refill: 3 - metoprolol succinate (TOPROL-XL) 100 MG 24 hr tablet; Take 1 tablet (100 mg total) by mouth daily. Take with or immediately following a meal.  Dispense: 90 tablet; Refill: 3  4. BPH associated with nocturia - tamsulosin (FLOMAX) 0.4 MG CAPS capsule; Take 1 capsule (0.4 mg total) by mouth at bedtime.  Dispense: 90 capsule; Refill: 1 - Ambulatory referral to Urology  5. Acute pain of left shoulder -take tylenol - Ambulatory referral to Orthopedic Surgery - Ambulatory referral to Chiropractic - methocarbamol (ROBAXIN) 500 MG tablet; Take 1 tablet (500 mg total) by mouth 4 (four) times daily.  Dispense: 90 tablet; Refill: 0    Follow Up Instructions: See PCP in about 4 months;  Sooner if needed   I discussed the assessment and treatment plan with the patient. The patient was provided an opportunity to ask questions and all were answered. The patient agreed with the plan and demonstrated an understanding of the instructions.   The patient was advised to call back or seek an in-person evaluation if the symptoms worsen or if the condition fails to improve as anticipated.  I provided 14 minutes of non-face-to-face time during this encounter.   Freeman Caldron, PA-C  Patient ID: Albert Good., male   DOB:  [birth year], 64 y.o.   MRN: [id number]

## 2019-08-30 NOTE — Progress Notes (Signed)
Cardiology Office Note   Date:  08/30/2019   ID:  Albert Bon., DOB 02-12-1955, MRN [id number]  PCP:  Antony Blackbird, MD  Cardiologist:   Dorris Carnes, MD    F/U of Albert Good and Albert     History of Present Illness: Albert Albert Good. is a 64 y.o. male with a history of  Albert Good, Albert and OSA (not on CPAP)    Patient diagnosed with afib in [place] (Dr Sammuel Cooper, [place])  Cardioverted a couple times   Started on Flecanide   Stress test in 2016 normal  LVEF normal  .   Seen by me 07/2018 for chest pain and palpitations.Myovue was normal  Event monitro without afib   With CHADSVASC 2 recommended NOAC  The pt says he has been doing great until last week   Felt skippping   BP was up 172/104 max   Came down     Since then BP 140s/   80s-90  He denies CP  Breathing is OK      Current Meds  Medication Sig  . allopurinol (ZYLOPRIM) 300 MG tablet Take 1 tablet (300 mg total) by mouth daily. To treat gout  . apixaban (ELIQUIS) 5 MG TABS tablet Take 1 tablet (5 mg total) by mouth 2 (two) times daily.  . colchicine 0.6 MG tablet TAKE 1 TABLET (0.6 MG TOTAL) BY MOUTH DAILY. TO TREAT GOUT  . diltiazem (CARTIA XT) 300 MG 24 hr capsule Take 1 capsule (300 mg total) by mouth daily.  . flecainide (TAMBOCOR) 50 MG tablet Take 1 tablet (50 mg total) by mouth daily.  . irbesartan (AVAPRO) 300 MG tablet Take 1 tablet (300 mg total) by mouth daily.  . metoprolol succinate (TOPROL-XL) 100 MG 24 hr tablet Take 1 tablet (100 mg total) by mouth daily. Take with or immediately following a meal.  . tamsulosin (FLOMAX) 0.4 MG CAPS capsule Take 1 capsule (0.4 mg total) by mouth at bedtime.     Allergies:   Other   Past Medical History:  Diagnosis Date  . BPH associated with nocturia 03/12/2018  . BPH with obstruction/lower urinary tract symptoms   . Chronic gout of multiple sites 03/12/2018  . Essential hypertension   . Gout   . Long-term use of aspirin therapy 03/12/2018  . OSA (obstructive sleep  apnea)   . Paroxysmal atrial fibrillation [facility])     Past Surgical History:  Procedure Laterality Date  . LIPOSUCTION       Social History:  The patient  reports that he has quit smoking. His smokeless tobacco use includes chew. He reports previous alcohol use. He reports previous drug use.   Family History:  The patient's family history includes Diabetes in his brother, mother, and sister; Heart failure in his brother; Hypertension in his father; Kidney failure in his brother.    ROS:  Please see the history of present illness. All other systems are reviewed and  Negative to the above problem except as noted.    PHYSICAL EXAM: VS:  BP 134/90   Pulse (!) 54   Ht 6\' 2"  (1.88 m)   Wt 226 lb 3.2 oz (102.6 kg)   BMI 29.04 kg/m   GEN: Overweight 64 yo in no acute distress  HEENT: normal  Neck: no JVD, no carotid bruits  Cardiac: RRR; no murmurs, rubs, or gallops,  NO  LE edema  Respiratory:  clear to auscultation bilaterally,  GI: soft, nontender, nondistended, + BS  No hepatomegaly  MS: no deformity Moving all extremities   Skin: warm and dry, no rash Neuro:  Strength and sensation are intact Psych: euthymic mood, full affect   EKG:  EKG is ordered today.  Sinus bradycardia 54 bpm     Lipid Panel    Component Value Date/Time   CHOL 183 03/09/2018 1126   TRIG 74 03/09/2018 1126   HDL 64 03/09/2018 1126   CHOLHDL 2.9 03/09/2018 1126   LDLCALC 104 (H) 03/09/2018 1126      Wt Readings from Last 3 Encounters:  08/30/19 226 lb 3.2 oz (102.6 kg)  03/21/19 231 lb 12.8 oz (105.1 kg)  12/01/18 222 lb (100.7 kg)      ASSESSMENT AND PLAN: 1  Albert Good Rare spells  Limited   Keep on current regimen   Check labs    2  Albert   BP is not optimally controlled   Needs sleep study   Will schedule  3  OSA  Pt had study in [place] about 2 years ago  Told had sleep apnea  Will sched  Check:  CBC, Vit D , BMET, TSH and lipid     F/U in March     Current medicines are reviewed at  length with the patient today.  The patient does not have concerns regarding medicines.  Signed, Dorris Carnes, MD  08/30/2019 9:18 AM    [address], [place], [place]  [address] Phone: [phone]; Fax: [phone]

## 2019-08-30 NOTE — Patient Instructions (Signed)
Medication Instructions:  No changes today  *If you need a refill on your cardiac medications before your next appointment, please call your pharmacy*   Lab Work: Today: lipids, cbc, bmet, tsh, vit d If you have labs (blood work) drawn today and your tests are completely normal, you will receive your results only by: Marland Kitchen MyChart Message (if you have MyChart) OR . A paper copy in the mail If you have any lab test that is abnormal or we need to change your treatment, we will call you to review the results.   Testing/Procedures: Your physician has recommended that you have a sleep study. This test records several body functions during sleep, including: brain activity, eye movement, oxygen and carbon dioxide blood levels, heart rate and rhythm, breathing rate and rhythm, the flow of air through your mouth and nose, snoring, body muscle movements, and chest and belly movement.   Follow-Up: At [facility], you and your health needs are our priority.  As part of our continuing mission to provide you with exceptional heart care, we have created designated Provider Care Teams.  These Care Teams include your primary Cardiologist (physician) and Advanced Practice Providers (APPs -  Physician Assistants and Nurse Practitioners) who all work together to provide you with the care you need, when you need it.  We recommend signing up for the patient portal called "MyChart".  Sign up information is provided on this After Visit Summary.  MyChart is used to connect with patients for Virtual Visits (Telemedicine).  Patients are able to view lab/test results, encounter notes, upcoming appointments, etc.  Non-urgent messages can be sent to your provider as well.   To learn more about what you can do with MyChart, go to [email or web address].    Your next appointment:   6 month(s)  The format for your next appointment:   In Person  Provider:   You may see Dorris Carnes, MD or one of the following Advanced  Practice Providers on your designated Care Team:    Richardson Dopp, PA-C  Robbie Lis, [place]    Other Instructions

## 2019-08-31 ENCOUNTER — Telehealth: Payer: Self-pay

## 2019-08-31 LAB — CBC
Hematocrit: 44.5 % (ref 37.5–51.0)
Hemoglobin: 14.8 g/dL (ref 13.0–17.7)
MCH: 28.3 pg (ref 26.6–33.0)
MCHC: 33.3 g/dL (ref 31.5–35.7)
MCV: 85 fL (ref 79–97)
Platelets: 229 10*3/uL (ref 150–450)
RBC: 5.23 x10E6/uL (ref 4.14–5.80)
RDW: 14.3 % (ref 11.6–15.4)
WBC: 5.8 10*3/uL (ref 3.4–10.8)

## 2019-08-31 LAB — BASIC METABOLIC PANEL
BUN/Creatinine Ratio: 8 — ABNORMAL LOW (ref 10–24)
BUN: 10 mg/dL (ref 8–27)
CO2: 27 mmol/L (ref 20–29)
Calcium: 9.1 mg/dL (ref 8.6–10.2)
Chloride: 99 mmol/L (ref 96–106)
Creatinine, Ser: 1.26 mg/dL (ref 0.76–1.27)
GFR calc Af Amer: 69 mL/min/{1.73_m2} (ref >59)
GFR calc non Af Amer: 59 mL/min/{1.73_m2} — ABNORMAL LOW (ref >59)
Glucose: 86 mg/dL (ref 65–99)
Potassium: 4 mmol/L (ref 3.5–5.2)
Sodium: 138 mmol/L (ref 134–144)

## 2019-08-31 LAB — LIPID PANEL
Chol/HDL Ratio: 2.5 ratio (ref 0.0–5.0)
Cholesterol, Total: 161 mg/dL (ref 100–199)
HDL: 65 mg/dL (ref >39)
LDL Chol Calc (NIH): 82 mg/dL (ref 0–99)
Triglycerides: 71 mg/dL (ref 0–149)
VLDL Cholesterol Cal: 14 mg/dL (ref 5–40)

## 2019-08-31 LAB — VITAMIN D 25 HYDROXY (VIT D DEFICIENCY, FRACTURES): Vit D, 25-Hydroxy: 45.6 ng/mL (ref 30.0–100.0)

## 2019-08-31 LAB — TSH: TSH: 2.15 u[IU]/mL (ref 0.450–4.500)

## 2019-08-31 NOTE — Telephone Encounter (Signed)
METHOCARBAMOL IS NOT COVERED UNDER PT'S INS, THEY PREFER CYCLOBENZAPRINE.  IF APPROPRIATE CAN YOU CHANGE THERAPY?

## 2019-09-03 ENCOUNTER — Other Ambulatory Visit: Payer: Self-pay | Admitting: Physician Assistant

## 2019-09-03 MED ORDER — TIZANIDINE HCL 2 MG PO CAPS: 2.0000 mg | ORAL_CAPSULE | Freq: Three times a day (TID) | ORAL | 0 refills | Status: DC | PRN

## 2019-09-03 MED ORDER — CYCLOBENZAPRINE HCL 5 MG PO TABS: 5.0000 mg | ORAL_TABLET | Freq: Three times a day (TID) | ORAL | 1 refills | Status: DC | PRN

## 2019-09-03 MED FILL — tiZANidine HCL 2 MG TABS: 2 | 20 days supply | Qty: 60 | Fill #0

## 2019-09-03 NOTE — Telephone Encounter (Signed)
I sent an Rx for flexeril.  Thanks, Freeman Caldron, PA-c

## 2019-09-04 ENCOUNTER — Telehealth: Payer: Self-pay | Admitting: *Deleted

## 2019-09-04 NOTE — Telephone Encounter (Signed)
Staff message sent to [name] ok to schedule sleep study. Per chart no insurance to pre cert. May want to confirm with patient. If patient does have insurance, resend request to sleep pool.

## 2019-09-04 NOTE — Telephone Encounter (Signed)
-----   Message from Lauralee Evener, [place] sent at 09/04/2019 10:21 AM EDT ----- Regarding: RE: sleep study Per chart patient has no insurance. Ok to schedule. Nothing to pre cert. ----- Message ----- From: Rodman Key, RN Sent: 08/30/2019   9:44 AM EDT To: Freada Bergeron, CMA, Cv Div Sleep Studies Subject: sleep study                                    Hey, Dr. Harrington Challenger (re) ordered sleep study for this patient.  Thanks!  Michalene

## 2019-09-14 ENCOUNTER — Telehealth: Payer: Self-pay | Admitting: *Deleted

## 2019-09-14 NOTE — Telephone Encounter (Signed)
PA submitted to [facility] via web portal.

## 2019-09-18 ENCOUNTER — Telehealth: Payer: Self-pay | Admitting: *Deleted

## 2019-09-18 MED FILL — ELIQUIS 5 MG TABLET: 5 | 30 days supply | Qty: 60 | Fill #0

## 2019-09-18 MED FILL — TAMSULOSIN HCL 0.4 MG CAP: 0.4 | 90 days supply | Qty: 90 | Fill #0

## 2019-09-18 NOTE — Telephone Encounter (Signed)
Staff message sent to [place] received for sleep study. Ok to schedule. Auth # [id number]. Valid dates 09/18/19 to 03/16/20.

## 2019-09-19 ENCOUNTER — Ambulatory Visit: Payer: 59 | Admitting: Orthopaedic Surgery

## 2019-09-21 ENCOUNTER — Telehealth: Payer: Self-pay | Admitting: *Deleted

## 2019-09-21 NOTE — Telephone Encounter (Signed)
Staff message sent to Albert Good ok to schedule HST. No PA is required by his insurance.

## 2019-09-24 MED FILL — COLCHICINE 0.6 MG TABS: 0.6 | 30 days supply | Qty: 30 | Fill #0

## 2019-09-27 ENCOUNTER — Other Ambulatory Visit: Payer: Self-pay | Admitting: *Deleted

## 2019-09-27 DIAGNOSIS — R0683 Snoring: Secondary | ICD-10-CM

## 2019-09-27 NOTE — Telephone Encounter (Signed)
RE: Home sleep study ok?  Rodman Key, RN  Freada Bergeron, CMA Hi [name], I placed the order for home sleep study. Thank you.   Michalene  ----- Message -----  From: Freada Bergeron, CMA  Sent: 09/20/2019  5:02 PM EDT  To: Fay Records, MD, Rodman Key, RN  Subject: Home sleep study ok?               Pt prefers a Home sleep study  ----- Message -----  From: Lauralee Evener, CMA  Sent: 09/20/2019  3:36 PM EDT  To: Freada Bergeron, CMA  Subject: RE: precert                    The MD may not want a HST. DUE to his medical Hx. Check before scheduling.  ----- Message -----  From: Freada Bergeron, CMA  Sent: 09/20/2019  2:18 PM EDT  To: Lauralee Evener, CMA  Subject: precert                      Pt prefers a Home sleep study  ----- Message -----  From: Lauralee Evener, CMA  Sent: 09/18/2019  1:04 PM EDT  To: Freada Bergeron, CMA  Subject: RE: sleep study- insurance in system       Ok to schedule sleep study. [employer] received. Josem Kaufmann #[id number] Valid dates 09/18/19 to 03/16/20.  ----- Message -----  From: Freada Bergeron, CMA  Sent: 09/13/2019  2:03 PM EDT  To: Lauralee Evener, CMA  Subject: RE: sleep study- insurance in system       E-AETNA MED*/AETNA MEDICARE*     Coverage Info Member ID: [id number] Group: DISTRICT COUNCI* Rel to subscriber: Self  Subscriber ID: [id number] Effective from: 12/12/2018 Auth phone:

## 2019-09-27 NOTE — Progress Notes (Signed)
Per Dr. Harrington Challenger, placed order for home sleep study.

## 2019-09-28 NOTE — Telephone Encounter (Addendum)
Patient is aware and agreeable to Home Sleep Study through [facility]. Patient is scheduled for 11/12/19 at 5 pm to pick up home sleep kit and meet with Respiratory therapist at [facility]. Patient is aware that if this appointment date and time does not work for them they should contact Artis Delay directly at [phone]. Patient is aware that a sleep packet will be sent from [facility] in week. Patient is agreeable to treatment and thankful for call.

## 2019-10-15 MED FILL — FLECAINIDE ACETATE 50 MG TA: 50 | 90 days supply | Qty: 90 | Fill #1

## 2019-10-15 MED FILL — ELIQUIS 5 MG TABLET: 5 | 30 days supply | Qty: 60 | Fill #1

## 2019-10-15 MED FILL — CARTIA XT 300 MG CAPSULE SA: 300 | 90 days supply | Qty: 90 | Fill #1

## 2019-10-21 ENCOUNTER — Encounter (HOSPITAL_BASED_OUTPATIENT_CLINIC_OR_DEPARTMENT_OTHER): Payer: Medicare HMO | Admitting: Cardiology

## 2019-10-29 MED FILL — COLCHICINE 0.6 MG TABS: 0.6 | 30 days supply | Qty: 30 | Fill #1

## 2019-11-12 ENCOUNTER — Other Ambulatory Visit: Payer: Self-pay

## 2019-11-12 ENCOUNTER — Ambulatory Visit (HOSPITAL_BASED_OUTPATIENT_CLINIC_OR_DEPARTMENT_OTHER): Payer: Medicare HMO | Admitting: Cardiology

## 2019-11-12 ENCOUNTER — Encounter (HOSPITAL_BASED_OUTPATIENT_CLINIC_OR_DEPARTMENT_OTHER): Payer: Medicare HMO

## 2019-11-14 NOTE — Procedures (Signed)
Erroneous encounter

## 2019-11-14 NOTE — Progress Notes (Signed)
This encounter was created in error - please disregard.

## 2019-11-15 MED FILL — ELIQUIS 5 MG TABLET: 5 | 30 days supply | Qty: 60 | Fill #2

## 2019-11-26 ENCOUNTER — Ambulatory Visit (HOSPITAL_BASED_OUTPATIENT_CLINIC_OR_DEPARTMENT_OTHER): Payer: Medicare HMO | Attending: Internal Medicine | Admitting: Cardiology

## 2019-11-26 ENCOUNTER — Other Ambulatory Visit: Payer: Self-pay

## 2019-11-26 DIAGNOSIS — G4734 Idiopathic sleep related nonobstructive alveolar hypoventilation: Secondary | ICD-10-CM

## 2019-11-26 DIAGNOSIS — R0902 Hypoxemia: Secondary | ICD-10-CM | POA: Insufficient documentation

## 2019-11-26 DIAGNOSIS — G4733 Obstructive sleep apnea (adult) (pediatric): Secondary | ICD-10-CM

## 2019-11-26 DIAGNOSIS — R0683 Snoring: Secondary | ICD-10-CM

## 2019-11-28 NOTE — Procedures (Signed)
    Patient Name: Albert Good, Albert Good Date: 11/26/2019 Gender: Male D.O.B: [birth year] Age (years): 64 Referring Provider: Dorris Carnes Height (inches): 74 Interpreting Physician: Fransico Him MD, ABSM Weight (lbs): 215 RPSGT: Jacolyn Reedy BMI: 28 MRN: [id number] Neck Size: 15.50  CLINICAL INFORMATION Sleep Study Type: HST  Indication for sleep study: Snoring  Epworth Sleepiness Score: 9  SLEEP STUDY TECHNIQUE A multi-channel overnight portable sleep study was performed. The channels recorded were: nasal airflow, thoracic respiratory movement, and oxygen saturation with a pulse oximetry. Snoring was also monitored.  MEDICATIONS Patient self administered medications include: N/A.  SLEEP ARCHITECTURE Patient was studied for 390.7 minutes. The sleep efficiency was 100.0 % and the patient was supine for 92.4%. The arousal index was 0.0 per hour.  RESPIRATORY PARAMETERS The overall AHI was 31.8 per hour, with a central apnea index of 0.0 per hour.  The oxygen nadir was 72% during sleep.  CARDIAC DATA Mean heart rate during sleep was 62.5 bpm.  IMPRESSIONS - Severe obstructive sleep apnea occurred during this study (AHI = 31.8/h). - No significant central sleep apnea occurred during this study (CAI = 0.0/h). - Severe oxygen desaturation was noted during this study (Min O2 = 72%). - Patient snored 5.1% during the sleep.  DIAGNOSIS - Obstructive Sleep Apnea (G47.33) - Nocturnal Hypoxemia (G47.36)  RECOMMENDATIONS - Recommend in lab CPAP titration due to severity of OSA and nocturnal hypoxemia. - Positional therapy avoiding supine position during sleep. - Avoid alcohol, sedatives and other CNS depressants that may worsen sleep apnea and disrupt normal sleep architecture. - Sleep hygiene should be reviewed to assess factors that may improve sleep quality. - Weight management and regular exercise should be initiated or continued.  [Electronically signed] 11/28/2019  02:14 PM  Fransico Him MD, ABSM Diplomate, American Board of Sleep Medicine

## 2019-11-29 ENCOUNTER — Other Ambulatory Visit: Payer: Self-pay | Admitting: Physician Assistant

## 2019-11-29 DIAGNOSIS — M1A09X0 Idiopathic chronic gout, multiple sites, without tophus (tophi): Secondary | ICD-10-CM

## 2019-11-29 MED FILL — METOPROLOL SUCCINATE ER 100: 100 | 90 days supply | Qty: 90 | Fill #2

## 2019-11-29 MED FILL — COLCHICINE 0.6 MG TABS: 0.6 | 30 days supply | Qty: 30 | Fill #0

## 2019-12-04 ENCOUNTER — Telehealth: Payer: Self-pay | Admitting: *Deleted

## 2019-12-04 ENCOUNTER — Encounter: Payer: Self-pay | Admitting: *Deleted

## 2019-12-04 NOTE — Telephone Encounter (Addendum)
Informed patient of sleep study results and patient understanding was verbalized. Patient understands her sleep study showed they have sleep apnea and recommend CPAP titration. Please set up titration in the sleep lab.   Pt is aware and agreeable to his results. Titration sent to sleep pool. PER DPR Left detailed message on voicemail and informed patient to call back with questions.

## 2019-12-04 NOTE — Telephone Encounter (Signed)
This encounter was created in error - please disregard.

## 2019-12-04 NOTE — Telephone Encounter (Signed)
-----   Message from Sueanne Margarita, MD sent at 11/28/2019  2:16 PM EST ----- Please let patient know that they have sleep apnea and recommend CPAP titration. Please set up titration in the sleep lab.

## 2019-12-09 ENCOUNTER — Encounter: Payer: Self-pay | Admitting: *Deleted

## 2019-12-09 DIAGNOSIS — G4733 Obstructive sleep apnea (adult) (pediatric): Secondary | ICD-10-CM

## 2019-12-09 NOTE — Telephone Encounter (Signed)
-----   Message from Sueanne Margarita, MD sent at 11/28/2019  2:16 PM EST ----- Please let patient know that they have sleep apnea and recommend CPAP titration. Please set up titration in the sleep lab.

## 2019-12-09 NOTE — Telephone Encounter (Signed)
Informed patient of sleep study results and patient understanding was verbalized. Patient understands his sleep study showed they have sleep apnea and recommend CPAP titration. Please set up titration in the sleep lab.  Titration sent to sleep pool PER DPR

## 2019-12-11 ENCOUNTER — Telehealth: Payer: Self-pay | Admitting: Cardiology

## 2019-12-11 NOTE — Telephone Encounter (Signed)
Cpap titration authorized for Albert Good 12-10-19 thru 06-08-20 #[id number].  Called patient and lm at his request (he was driving) that he is scheduled for Saturday, January 8 at 8 pm, to expect info packet and the phone number for the sleep lab.

## 2019-12-11 NOTE — Telephone Encounter (Signed)
This encounter was created in error - please disregard.

## 2019-12-17 MED FILL — ALLOPURINOL 300 MG TAB: 300 | 90 days supply | Qty: 90 | Fill #1

## 2019-12-17 MED FILL — IRBESARTAN 300 MG TABS: 300 | 90 days supply | Qty: 90 | Fill #1

## 2019-12-17 MED FILL — TAMSULOSIN HCL 0.4 MG CAP: 0.4 | 90 days supply | Qty: 90 | Fill #1

## 2019-12-17 MED FILL — ELIQUIS 5 MG TABLET: 5 | 30 days supply | Qty: 60 | Fill #3

## 2019-12-26 MED FILL — COLCHICINE 0.6 MG TABS: 0.6 | 30 days supply | Qty: 30 | Fill #1

## 2020-01-08 MED FILL — FLECAINIDE ACETATE 50 MG TA: 50 | 90 days supply | Qty: 90 | Fill #2

## 2020-01-08 MED FILL — CARTIA XT 300 MG CAPSULE SA: 300 | 90 days supply | Qty: 90 | Fill #2

## 2020-01-11 MED FILL — ELIQUIS 5 MG TABLET: 5 | 30 days supply | Qty: 60 | Fill #4

## 2020-01-19 ENCOUNTER — Ambulatory Visit (HOSPITAL_BASED_OUTPATIENT_CLINIC_OR_DEPARTMENT_OTHER): Payer: Medicare HMO | Attending: Cardiology | Admitting: Cardiology

## 2020-01-19 ENCOUNTER — Other Ambulatory Visit: Payer: Self-pay

## 2020-01-19 VITALS — Ht 74.0 in | Wt 215.0 lb

## 2020-01-19 DIAGNOSIS — G4733 Obstructive sleep apnea (adult) (pediatric): Secondary | ICD-10-CM | POA: Insufficient documentation

## 2020-01-21 NOTE — Procedures (Signed)
  Patient Name: Albert Good, Albert Good Date: 01/19/2020 Gender: Male D.O.B: 08/21/1955 Age (years): 4 Referring Provider: Fransico Him MD, ABSM Height (inches): 74 Interpreting Physician: Fransico Him MD, ABSM Weight (lbs): 215 RPSGT: Earney Hamburg BMI: 28 MRN: [id number] Neck Size: 15.50  CLINICAL INFORMATION The patient is referred for a CPAP titration to treat sleep apnea.  SLEEP STUDY TECHNIQUE As per the AASM Manual for the Scoring of Sleep and Associated Events v2.3 (April 2016) with a hypopnea requiring 4% desaturations.  The channels recorded and monitored were frontal, central and occipital EEG, electrooculogram (EOG), submentalis EMG (chin), nasal and oral airflow, thoracic and abdominal wall motion, anterior tibialis EMG, snore microphone, electrocardiogram, and pulse oximetry. Continuous positive airway pressure (CPAP) was initiated at the beginning of the study and titrated to treat sleep-disordered breathing.  MEDICATIONS Medications self-administered by patient taken the night of the study : ELIQUIS, COLCHICINE, IRBESARTAN, TAMSULOSIN HYDROCHLORIDE  TECHNICIAN COMMENTS Comments added by technician: Patient had difficulty initiating sleep. Comments added by scorer: N/A  RESPIRATORY PARAMETERS Optimal PAP Pressure (cm): 12  AHI at Optimal Pressure (/hr):1.4 Overall Minimal O2 (%):90.0  Supine % at Optimal Pressure (%):100 Minimal O2 at Optimal Pressure (%): 93.0   SLEEP ARCHITECTURE The study was initiated at 9:48:21 PM and ended at 4:08:05 AM.  Sleep onset time was 70.4 minutes and the sleep efficiency was 61.5%. The total sleep time was 233.5 minutes.  The patient spent 3.9% of the night in stage N1 sleep, 76.9% in stage N2 sleep, 0.0% in stage N3 and 19.3% in REM.Stage REM latency was 138.5 minutes  Wake after sleep onset was 75.9. Alpha intrusion was absent. Supine sleep was 22.48%.  CARDIAC DATA The 2 lead EKG demonstrated sinus rhythm. The mean  heart rate was 56.2 beats per minute. Other EKG findings include: None.  LEG MOVEMENT DATA The total Periodic Limb Movements of Sleep (PLMS) were 0. The PLMS index was 0.0. A PLMS index of <15 is considered normal in adults.  IMPRESSIONS - The optimal PAP pressure was 12 cm of water. - Central sleep apnea was not noted during this titration (CAI = 0.3/h). - Significant oxygen desaturations were not observed during this titration (min O2 = 90.0%). - The patient snored with soft snoring volume during this titration study. - No cardiac abnormalities were observed during this study. - Clinically significant periodic limb movements were not noted during this study. Arousals associated with PLMs were rare.  DIAGNOSIS - Obstructive Sleep Apnea (G47.33)  RECOMMENDATIONS - Trial of CPAP therapy on 12 cm H2O with a Medium size Fisher&Paykel Full Face Mask Simplus mask and heated humidification. - Avoid alcohol, sedatives and other CNS depressants that may worsen sleep apnea and disrupt normal sleep architecture. - Sleep hygiene should be reviewed to assess factors that may improve sleep quality. - Weight management and regular exercise should be initiated or continued. - Return to [facility] for re-evaluation after 8 weeks of therapy  [Electronically signed] 01/21/2020 08:31 AM  Fransico Him MD, ABSM Diplomate, American Board of Sleep Medicine

## 2020-01-23 ENCOUNTER — Telehealth: Payer: Self-pay | Admitting: *Deleted

## 2020-01-23 NOTE — Telephone Encounter (Signed)
-----   Message from Sueanne Margarita, MD sent at 01/21/2020  8:34 AM EST ----- Please let patient know that they had a successful PAP titration and let DME know that orders are in EPIC.  Please set up 8 week OV with me.

## 2020-01-23 NOTE — Telephone Encounter (Signed)
Informed patient of sleep study results and patient understanding was verbalized. Patient understands his sleep study showed that they had a successful PAP titration and let DME know that orders are in EPIC. Please set up 8 week OV with me.    Upon patient request DME selection is [facility]. Patient understands she/he will be contacted by [facility] to set up her/he cpap. Patient understands to call if [facility] does not contact her/he with new setup in a timely manner. Patient understands they will be called once confirmation has been received from [facility] that they have received their new machine to schedule 10 week follow up appointment.   [facility] notified of new cpap order  Please add to airview Patient was grateful for the call and thanked me.

## 2020-01-24 ENCOUNTER — Ambulatory Visit: Payer: Medicare HMO | Attending: Internal Medicine | Admitting: Internal Medicine

## 2020-01-24 ENCOUNTER — Ambulatory Visit (HOSPITAL_BASED_OUTPATIENT_CLINIC_OR_DEPARTMENT_OTHER): Payer: Medicare HMO | Admitting: Pharmacist

## 2020-01-24 ENCOUNTER — Encounter: Payer: Self-pay | Admitting: Internal Medicine

## 2020-01-24 ENCOUNTER — Other Ambulatory Visit: Payer: Self-pay | Admitting: Internal Medicine

## 2020-01-24 ENCOUNTER — Other Ambulatory Visit: Payer: Self-pay

## 2020-01-24 VITALS — BP 142/90 | HR 59 | Temp 97.8°F | Ht 74.0 in | Wt 227.6 lb

## 2020-01-24 DIAGNOSIS — R351 Nocturia: Secondary | ICD-10-CM | POA: Diagnosis not present

## 2020-01-24 DIAGNOSIS — E663 Overweight: Secondary | ICD-10-CM | POA: Insufficient documentation

## 2020-01-24 DIAGNOSIS — Z23 Encounter for immunization: Secondary | ICD-10-CM

## 2020-01-24 DIAGNOSIS — I10 Essential (primary) hypertension: Secondary | ICD-10-CM

## 2020-01-24 DIAGNOSIS — Z86010 Personal history of colonic polyps: Secondary | ICD-10-CM | POA: Diagnosis not present

## 2020-01-24 DIAGNOSIS — G4733 Obstructive sleep apnea (adult) (pediatric): Secondary | ICD-10-CM | POA: Diagnosis not present

## 2020-01-24 DIAGNOSIS — L608 Other nail disorders: Secondary | ICD-10-CM

## 2020-01-24 DIAGNOSIS — N401 Enlarged prostate with lower urinary tract symptoms: Secondary | ICD-10-CM

## 2020-01-24 DIAGNOSIS — I480 Paroxysmal atrial fibrillation: Secondary | ICD-10-CM

## 2020-01-24 MED ORDER — TAMSULOSIN HCL 0.4 MG PO CAPS: 0.8000 mg | ORAL_CAPSULE | Freq: Every day | ORAL | 1 refills | Status: DC

## 2020-01-24 MED FILL — TAMSULOSIN HCL 0.4 MG CAP: 0.4 | 90 days supply | Qty: 180 | Fill #0

## 2020-01-24 NOTE — Progress Notes (Signed)
Patient presents for vaccination against influenza and strep pneumo per orders of Dr. Johnson. Consent given. Counseling provided. No contraindications exists. Vaccine administered without incident.  ° °Luke Van Ausdall, PharmD, BCACP, CPP °Clinical Pharmacist °[facility] °[phone] ° °

## 2020-01-24 NOTE — Patient Instructions (Addendum)
Please check your blood pressure at least 3 times a week and write down the numbers.  Bring those readings in with you when you come to see the clinical pharmacist in 2 weeks.  Our goal is to get your blood pressure and keep it 130/80 or lower.  When you go to checkout, please sign a release for [place] to get your colonoscopy report from the physician in [place].   Healthy Eating Following a healthy eating pattern may help you to achieve and maintain a healthy body weight, reduce the risk of chronic disease, and live a long and productive life. It is important to follow a healthy eating pattern at an appropriate calorie level for your body. Your nutritional needs should be met primarily through food by choosing a variety of nutrient-rich foods. What are tips for following this plan? Reading food labels  Read labels and choose the following: ? Reduced or low sodium. ? Juices with 100% fruit juice. ? Foods with low saturated fats and high polyunsaturated and monounsaturated fats. ? Foods with whole grains, such as whole wheat, cracked wheat, brown rice, and wild rice. ? Whole grains that are fortified with folic acid. This is recommended for women who are pregnant or who want to become pregnant.  Read labels and avoid the following: ? Foods with a lot of added sugars. These include foods that contain brown sugar, corn sweetener, corn syrup, dextrose, fructose, glucose, high-fructose corn syrup, honey, invert sugar, lactose, malt syrup, maltose, molasses, raw sugar, sucrose, trehalose, or turbinado sugar.  Do not eat more than the following amounts of added sugar per day:  6 teaspoons (25 g) for women.  9 teaspoons (38 g) for men. ? Foods that contain processed or refined starches and grains. ? Refined grain products, such as white flour, degermed cornmeal, white bread, and white rice. Shopping  Choose nutrient-rich snacks, such as vegetables, whole fruits, and nuts. Avoid high-calorie and  high-sugar snacks, such as potato chips, fruit snacks, and candy.  Use oil-based dressings and spreads on foods instead of solid fats such as butter, stick margarine, or cream cheese.  Limit pre-made sauces, mixes, and "instant" products such as flavored rice, instant noodles, and ready-made pasta.  Try more plant-protein sources, such as tofu, tempeh, black beans, edamame, lentils, nuts, and seeds.  Explore eating plans such as the Mediterranean diet or vegetarian diet. Cooking  Use oil to saut or stir-fry foods instead of solid fats such as butter, stick margarine, or lard.  Try baking, boiling, grilling, or broiling instead of frying.  Remove the fatty part of meats before cooking.  Steam vegetables in water or broth. Meal planning  At meals, imagine dividing your plate into fourths: ? One-half of your plate is fruits and vegetables. ? One-fourth of your plate is whole grains. ? One-fourth of your plate is protein, especially lean meats, poultry, eggs, tofu, beans, or nuts.  Include low-fat dairy as part of your daily diet.   Lifestyle  Choose healthy options in all settings, including home, work, school, restaurants, or stores.  Prepare your food safely: ? Wash your hands after handling raw meats. ? Keep food preparation surfaces clean by regularly washing with hot, soapy water. ? Keep raw meats separate from ready-to-eat foods, such as fruits and vegetables. ? Cook seafood, meat, poultry, and eggs to the recommended internal temperature. ? Store foods at safe temperatures. In general:  Keep cold foods at 22F (4.4C) or below.  Keep hot foods at 122F (60C) or  above.  Keep your freezer at [facility] (-17.8C) or below.  Foods are no longer safe to eat when they have been between the temperatures of 40-140F (4.4-60C) for more than 2 hours. What foods should I eat? Fruits Aim to eat 2 cup-equivalents of fresh, canned (in natural juice), or frozen fruits each day.  Examples of 1 cup-equivalent of fruit include 1 small apple, 8 large strawberries, 1 cup canned fruit,  cup dried fruit, or 1 cup 100% juice. Vegetables Aim to eat 2-3 cup-equivalents of fresh and frozen vegetables each day, including different varieties and colors. Examples of 1 cup-equivalent of vegetables include 2 medium carrots, 2 cups raw, leafy greens, 1 cup chopped vegetable (raw or cooked), or 1 medium baked potato. Grains Aim to eat 6 ounce-equivalents of whole grains each day. Examples of 1 ounce-equivalent of grains include 1 slice of bread, 1 cup ready-to-eat cereal, 3 cups popcorn, or  cup cooked rice, pasta, or cereal. Meats and other proteins Aim to eat 5-6 ounce-equivalents of protein each day. Examples of 1 ounce-equivalent of protein include 1 egg, 1/2 cup nuts or seeds, or 1 tablespoon (16 g) peanut butter. A cut of meat or fish that is the size of a deck of cards is about 3-4 ounce-equivalents.  Of the protein you eat each week, try to have at least 8 ounces come from seafood. This includes salmon, trout, herring, and anchovies. Dairy Aim to eat 3 cup-equivalents of fat-free or low-fat dairy each day. Examples of 1 cup-equivalent of dairy include 1 cup (240 mL) milk, 8 ounces (250 g) yogurt, 1 ounces (44 g) natural cheese, or 1 cup (240 mL) fortified soy milk. Fats and oils  Aim for about 5 teaspoons (21 g) per day. Choose monounsaturated fats, such as canola and olive oils, avocados, peanut butter, and most nuts, or polyunsaturated fats, such as sunflower, corn, and soybean oils, walnuts, pine nuts, sesame seeds, sunflower seeds, and flaxseed. Beverages  Aim for six 8-oz glasses of water per day. Limit coffee to three to five 8-oz cups per day.  Limit caffeinated beverages that have added calories, such as soda and energy drinks.  Limit alcohol intake to no more than 1 drink a day for nonpregnant women and 2 drinks a day for men. One drink equals 12 oz of beer (355 mL),  5 oz of wine (148 mL), or 1 oz of hard liquor (44 mL). Seasoning and other foods  Avoid adding excess amounts of salt to your foods. Try flavoring foods with herbs and spices instead of salt.  Avoid adding sugar to foods.  Try using oil-based dressings, sauces, and spreads instead of solid fats. This information is based on general [place] nutrition guidelines. For more information, visit BuildDNA.es. Exact amounts may vary based on your nutrition needs. Summary  A healthy eating plan may help you to maintain a healthy weight, reduce the risk of chronic diseases, and stay active throughout your life.  Plan your meals. Make sure you eat the right portions of a variety of nutrient-rich foods.  Try baking, boiling, grilling, or broiling instead of frying.  Choose healthy options in all settings, including home, work, school, restaurants, or stores. This information is not intended to replace advice given to you by your health care provider. Make sure you discuss any questions you have with your health care provider. Document Revised: 04/11/2017 Document Reviewed: 04/11/2017 Elsevier Patient Education  [place].   Pneumococcal Conjugate Vaccine (PCV13): What You Need to Know 1.  Why get vaccinated? Pneumococcal conjugate vaccine (PCV13) can prevent pneumococcal disease. Pneumococcal disease refers to any illness caused by pneumococcal bacteria. These bacteria can cause many types of illnesses, including pneumonia, which is an infection of the lungs. Pneumococcal bacteria are one of the most common causes of pneumonia. Besides pneumonia, pneumococcal bacteria can also cause:  Ear infections  Sinus infections  Meningitis (infection of the tissue covering the brain and spinal cord)  Bacteremia (infection of the blood) Anyone can get pneumococcal disease, but children under 31 years old, people with certain medical conditions, adults 94 years or older, and cigarette smokers  are at the highest risk. Most pneumococcal infections are mild. However, some can result in long-term problems, such as brain damage or hearing loss. Meningitis, bacteremia, and pneumonia caused by pneumococcal disease can be fatal. 2. PCV13 PCV13 protects against 13 types of bacteria that cause pneumococcal disease. Infants and young children usually need 4 doses of pneumococcal conjugate vaccine, at ages 29, 61, 37, and 12-15 months. Older children (through age 70 months) may be vaccinated if they did not receive the recommended doses. A dose of PCV13 is also recommended for adults and children 6 years or older with certain medical conditions if they did not already receive PCV13. This vaccine may be given to healthy adults 73 years or older who did not already receive PCV13, based on discussions between the patient and health care provider. 3. Talk with your health care provider Tell your vaccination provider if the person getting the vaccine:  Has had an allergic reaction after a previous dose of PCV13, to an earlier pneumococcal conjugate vaccine known as PCV7, or to any vaccine containing diphtheria toxoid (for example, DTaP), or has any severe, life-threatening allergies In some cases, your health care provider may decide to postpone PCV13 vaccination until a future visit. People with minor illnesses, such as a cold, may be vaccinated. People who are moderately or severely ill should usually wait until they recover before getting PCV13. Your health care provider can give you more information. 4. Risks of a vaccine reaction  Redness, swelling, pain, or tenderness where the shot is given, and fever, loss of appetite, fussiness (irritability), feeling tired, headache, and chills can happen after PCV13 vaccination. Young children may be at increased risk for seizures caused by fever after PCV13 if it is administered at the same time as inactivated influenza vaccine. Ask your health care provider for  more information. People sometimes faint after medical procedures, including vaccination. Tell your provider if you feel dizzy or have vision changes or ringing in the ears. As with any medicine, there is a very remote chance of a vaccine causing a severe allergic reaction, other serious injury, or death. 5. What if there is a serious problem? An allergic reaction could occur after the vaccinated person leaves the clinic. If you see signs of a severe allergic reaction (hives, swelling of the face and throat, difficulty breathing, a fast heartbeat, dizziness, or weakness), call 9-1-1 and get the person to the nearest hospital. For other signs that concern you, call your health care provider. Adverse reactions should be reported to the Vaccine Adverse Event Reporting System (VAERS). Your health care provider will usually file this report, or you can do it yourself. Visit the VAERS website at www.vaers.SamedayNews.es or call (684)594-5376. VAERS is only for reporting reactions, and VAERS staff members do not give medical advice. 6. The National Vaccine Injury Compensation Program The [employer] Vaccine Injury Compensation Program ([place]) is a  federal program that was created to compensate people who may have been injured by certain vaccines. Claims regarding alleged injury or death due to vaccination have a time limit for filing, which may be as short as two years. Visit the VICP website at GoldCloset.com.ee or call 631-821-5254 to learn about the program and about filing a claim. 7. How can I learn more?  Ask your health care provider.  Call your local or state health department.  Visit the website of the Food and Drug Administration (FDA) for vaccine package inserts and additional information at TraderRating.uy.  Contact the Centers for Disease Control and Prevention (CDC): ? Call 820 857 6118 (1-800-CDC-INFO) or ? Visit CDC's website at  http://hunter.com/. Vaccine Information Statement PCV13 (08/17/2019) This information is not intended to replace advice given to you by your health care provider. Make sure you discuss any questions you have with your health care provider. Document Revised: 10/04/2019 Document Reviewed: 10/04/2019 Elsevier Patient Education  [place].    Influenza Virus Vaccine injection (Fluarix) What is this medicine? INFLUENZA VIRUS VACCINE (in floo EN zuh VAHY ruhs vak SEEN) helps to reduce the risk of getting influenza also known as the flu. This medicine may be used for other purposes; ask your health care provider or pharmacist if you have questions. COMMON BRAND NAME(S): Fluarix, Fluzone What should I tell my health care provider before I take this medicine? They need to know if you have any of these conditions:  bleeding disorder like hemophilia  fever or infection  Guillain-Barre syndrome or other neurological problems  immune system problems  infection with the human immunodeficiency virus (HIV) or AIDS  low blood platelet counts  multiple sclerosis  an unusual or allergic reaction to influenza virus vaccine, eggs, chicken proteins, latex, gentamicin, other medicines, foods, dyes or preservatives  pregnant or trying to get pregnant  breast-feeding How should I use this medicine? This vaccine is for injection into a muscle. It is given by a health care professional. A copy of Vaccine Information Statements will be given before each vaccination. Read this sheet carefully each time. The sheet may change frequently. Talk to your pediatrician regarding the use of this medicine in children. Special care may be needed. Overdosage: If you think you have taken too much of this medicine contact a poison control center or emergency room at once. NOTE: This medicine is only for you. Do not share this medicine with others. What if I miss a dose? This does not apply. What may  interact with this medicine?  chemotherapy or radiation therapy  medicines that lower your immune system like etanercept, anakinra, infliximab, and adalimumab  medicines that treat or prevent blood clots like warfarin  phenytoin  steroid medicines like prednisone or cortisone  theophylline  vaccines This list may not describe all possible interactions. Give your health care provider a list of all the medicines, herbs, non-prescription drugs, or dietary supplements you use. Also tell them if you smoke, drink alcohol, or use illegal drugs. Some items may interact with your medicine. What should I watch for while using this medicine? Report any side effects that do not go away within 3 days to your doctor or health care professional. Call your health care provider if any unusual symptoms occur within 6 weeks of receiving this vaccine. You may still catch the flu, but the illness is not usually as bad. You cannot get the flu from the vaccine. The vaccine will not protect against colds or other illnesses that may  cause fever. The vaccine is needed every year. What side effects may I notice from receiving this medicine? Side effects that you should report to your doctor or health care professional as soon as possible:  allergic reactions like skin rash, itching or hives, swelling of the face, lips, or tongue Side effects that usually do not require medical attention (report to your doctor or health care professional if they continue or are bothersome):  fever  headache  muscle aches and pains  pain, tenderness, redness, or swelling at site where injected  weak or tired This list may not describe all possible side effects. Call your doctor for medical advice about side effects. You may report side effects to FDA at 1-800-FDA-1088. Where should I keep my medicine? This vaccine is only given in a clinic, pharmacy, doctor's office, or other health care setting and will not be stored at  home. NOTE: This sheet is a summary. It may not cover all possible information. If you have questions about this medicine, talk to your doctor, pharmacist, or health care provider.  2021 Elsevier/Gold Standard (2007-07-26 09:30:40)

## 2020-01-24 NOTE — Progress Notes (Signed)
Patient ID: Albert Koudelka., male    DOB: 1956/01/05  MRN: [id number]  CC: Follow-up (Pt. Is here for 4 months follow up. )   Subjective: Albert Good is a 65 y.o. male who presents for chronic disease management.  Previous PCP is Dr. Chapman Fitch who is no longer with this practice. His concerns today include:  Patient with history of paroxysmal atrial fibrillation on Eliquis, HTN, BPH, CKD stage II, chronic gout of multiple joints  BPH: has appt with [facility] 02/07/2020. On Flomax. Flow good "for the most part."   Sometime he has to strain to get flow going and wakes up 1-8 times at nights to urinate  HYPERTENSION/A.fib Currently taking: see medication list.  He is on diltiazem, metoprolol, Avapro, Eliquis and Flecainide.  Took meds today already Med Adherence: [x]  Yes    []  No Medication side effects: []  Yes    [x]  No Adherence with salt restriction: [x]  Yes    []  No Home Monitoring?: [x]  Yes  But not often Monitoring Frequency: []  Yes    []  No Home BP results range: []  Yes    []  No SOB? []  Yes    [x]  No Chest Pain?: []  Yes    [x]  No Leg swelling?: []  Yes    [x]  No Headaches?: []  Yes    [x]  No Dizziness? []  Yes    [x]  No Comments:  Palpitations occasionally last seconds. Last mth he was having palpitations in the mornings that would  last about 1 hr No bruising or bleeding on Eliquis Cardiologist is Dr. Harrington Challenger.  Next appt with her 03/2020  OSA:  Had sleep study this past wkend. Positive sleep apnea.  CPAP recommended. Reports order placed for CPAP by Dr. Harrington Challenger.  He was told by her office that will take about a month before he gets his device.  Obesity: Wgh 227 lbs but pt wearing a lot of clothing. Gained 9 lbs from 11/2019.  States he always gain wgh around the holidays Wants to get wgh to 215 lbs cut out pork.  Eats sandwich and chips for lunch.  Drinks juices and water.  Drinks two 16 oz beer daily Going to gym 5 days a wk. Does wgh training  Wanted me to have a look  at his nails.  He has straight-line ridges in the nails and just wanted to know what may be causing it.    HM: Reports he has received 2 COVID-19 vaccines.  He is due for booster.  He plans to get the booster shot soon. Reports will be due for c-scope next yr.  He has had 3 done in [place] - had polyps removed in past.  Last colonoscopy he reports was done in 2018 and was told that he will be due again in 5 years which will be 2023. Patient Active Problem List   Diagnosis Date Noted  . Chronic anticoagulation 03/28/2019  . Paroxysmal atrial fibrillation ([place]) 03/12/2018  . Essential hypertension 03/12/2018  . BPH associated with nocturia 03/12/2018  . Chronic gout of multiple sites 03/12/2018  . Long-term use of aspirin therapy 03/12/2018  . Chest pain 03/27/2017     Current Outpatient Medications on File Prior to Visit  Medication Sig Dispense Refill  . allopurinol (ZYLOPRIM) 300 MG tablet Take 1 tablet (300 mg total) by mouth daily. To treat gout 90 tablet 1  . apixaban (ELIQUIS) 5 MG TABS tablet Take 1 tablet (5 mg total) by mouth 2 (two) times daily. 60 tablet  11  . colchicine 0.6 MG tablet TAKE 1 TABLET (0.6 MG TOTAL) BY MOUTH DAILY. TO TREAT GOUT 30 tablet 1  . diltiazem (CARTIA XT) 300 MG 24 hr capsule Take 1 capsule (300 mg total) by mouth daily. 90 capsule 3  . flecainide (TAMBOCOR) 50 MG tablet Take 1 tablet (50 mg total) by mouth daily. 90 tablet 3  . irbesartan (AVAPRO) 300 MG tablet Take 1 tablet (300 mg total) by mouth daily. 90 tablet 3  . metoprolol succinate (TOPROL-XL) 100 MG 24 hr tablet Take 1 tablet (100 mg total) by mouth daily. Take with or immediately following a meal. 90 tablet 3   No current facility-administered medications on file prior to visit.    Allergies  Allergen Reactions  . Other Hives    HAIR DYE    Social History   Socioeconomic History  . Marital status: Unknown    Spouse name: Not on file  . Number of children: Not on file  . Years  of education: Not on file  . Highest education level: Not on file  Occupational History  . Not on file  Tobacco Use  . Smoking status: Former Research scientist (life sciences)  . Smokeless tobacco: Current User    Types: Chew  Vaping Use  . Vaping Use: Never used  Substance and Sexual Activity  . Alcohol use: Not Currently  . Drug use: Not Currently  . Sexual activity: Yes  Other Topics Concern  . Not on file  Social History Narrative  . Not on file   Social Determinants of Health   Financial Resource Strain: Not on file  Food Insecurity: Not on file  Transportation Needs: Not on file  Physical Activity: Not on file  Stress: Not on file  Social Connections: Not on file  Intimate Partner Violence: Not on file    Family History  Problem Relation Age of Onset  . Diabetes Mother   . Hypertension Father   . Diabetes Sister   . Diabetes Brother   . Heart failure Brother   . Kidney failure Brother     Past Surgical History:  Procedure Laterality Date  . LIPOSUCTION      ROS: Review of Systems Negative except as stated above  PHYSICAL EXAM: BP (!) 142/90   Pulse (!) 59   Temp 97.8 F (36.6 C) (Oral)   Ht 6\' 2"  (1.88 m)   Wt 227 lb 9.6 oz (103.2 kg)   SpO2 97%   BMI 29.22 kg/m   Wt Readings from Last 3 Encounters:  01/24/20 227 lb 9.6 oz (103.2 kg)  01/19/20 215 lb (97.5 kg)  11/26/19 215 lb (97.5 kg)    Physical Exam  General appearance - alert, well appearing, older African-American male and in no distress Mental status - normal mood, behavior, speech, dress, motor activity, and thought processes Neck - supple, no significant adenopathy Chest - clear to auscultation, no wheezes, rales or rhonchi, symmetric air entry Heart -heart sounds to be in regular rhythm at this time.  No gallops or murmurs heard. Extremities -no lower extremity edema.  He has clubbing of the fingernails.  Faint vertical ridges noted.  CMP Latest Ref Rng & Units 08/30/2019 03/21/2019 09/28/2018  Glucose 65  - 99 mg/dL 86 85 85  BUN 8 - 27 mg/dL 10 9 12   Creatinine 0.76 - 1.27 mg/dL 1.26 1.15 1.27  Sodium 134 - 144 mmol/L 138 142 142  Potassium 3.5 - 5.2 mmol/L 4.0 4.1 4.6  Chloride 96 - 106  mmol/L 99 103 105  CO2 20 - 29 mmol/L 27 24 22   Calcium 8.6 - 10.2 mg/dL 9.1 9.6 9.4   Lipid Panel     Component Value Date/Time   CHOL 161 08/30/2019 0943   TRIG 71 08/30/2019 0943   HDL 65 08/30/2019 0943   CHOLHDL 2.5 08/30/2019 0943   LDLCALC 82 08/30/2019 0943    CBC    Component Value Date/Time   WBC 5.8 08/30/2019 0943   RBC 5.23 08/30/2019 0943   HGB 14.8 08/30/2019 0943   HCT 44.5 08/30/2019 0943   PLT 229 08/30/2019 0943   MCV 85 08/30/2019 0943   MCH 28.3 08/30/2019 0943   MCHC 33.3 08/30/2019 0943   RDW 14.3 08/30/2019 0943   LYMPHSABS 2.9 09/28/2018 1000   EOSABS 0.2 09/28/2018 1000   BASOSABS 0.1 09/28/2018 1000    ASSESSMENT AND PLAN:  1. Essential hypertension Not at goal. DASH diet discussed and encouraged. Since he does have a home blood pressure monitoring device, I recommend that he check his blood pressure at least twice a week.  I will have him follow-up in 2 weeks with the clinical pharmacist for blood pressure recheck.  Goal is 130/80 or lower. Continue Avapro, metoprolol and diltiazem.  If any medication needs to be added, I would avoid diuretic if possible given his history of chronic gout of multiple joints.  2. Paroxysmal atrial fibrillation ([place]) Followed by cardiology.  On anticoagulation.  3. BPH associated with nocturia Still very symptomatic.  I recommend increasing the tamsulosin to 2.8 mg at bedtime.  Keep upcoming appointment with [facility]. - tamsulosin (FLOMAX) 0.4 MG CAPS capsule; Take 2 capsules (0.8 mg total) by mouth at bedtime.  Dispense: 180 capsule; Refill: 1  4. OSA (obstructive sleep apnea) Recent diagnosis. Advised patient that once he gets his CPAP machine it may take several weeks for him to get use to the field of sleeping  with the mask on.  However encouraged him to use it every night as insurances will discontinue paying for it if it is not being used consistently.  5. Overweight (BMI 25.0-29.9) Discussed and encouraged healthy eating habits.  Recommend snacking on healthier foods like fruits, nuts.  Advised to eliminate sugary drinks from the diet including juices and drink more water, eat more white lean meat instead of red meat, cut back on portion sizes of white carbohydrates. Encouraged him to try to incorporate some cardio into his workout instead of just weight lifting.  6. History of colon polyps I have asked that he signs a release for [place] to get colonoscopy report from the physician in [place].  Patient is agreeable to this.  7. Need for influenza vaccination Given today.  8. Need for vaccination against Streptococcus pneumoniae using pneumococcal conjugate vaccine 13 Given today.  9. Change in nail appearance Advised patient that sometimes this can be seen in certain vitamin deficiencies.  May consider taking a One-A-Day men's vitamin.  He is not anemic. Of note he does have clubbing of his nails.  We will discuss further on next visit.    Patient was given the opportunity to ask questions.  Patient verbalized understanding of the plan and was able to repeat key elements of the plan.   No orders of the defined types were placed in this encounter.    Requested Prescriptions   Signed Prescriptions Disp Refills  . tamsulosin (FLOMAX) 0.4 MG CAPS capsule 180 capsule 1    Sig: Take 2 capsules (0.8  mg total) by mouth at bedtime.    Return in about 4 months (around 05/23/2020) for Give appt with [facility] in 2 wks for BP recheck. Sign release for colonoscopy report.  Karle Plumber, MD, FACP

## 2020-01-25 ENCOUNTER — Other Ambulatory Visit: Payer: Self-pay | Admitting: Physician Assistant

## 2020-01-25 ENCOUNTER — Other Ambulatory Visit: Payer: Self-pay | Admitting: Internal Medicine

## 2020-01-25 DIAGNOSIS — M1A09X0 Idiopathic chronic gout, multiple sites, without tophus (tophi): Secondary | ICD-10-CM

## 2020-01-25 MED FILL — COLCHICINE 0.6 MG TABS: 0.6 | 30 days supply | Qty: 30 | Fill #0

## 2020-01-25 NOTE — Telephone Encounter (Signed)
Requested Prescriptions  Pending Prescriptions Disp Refills  . colchicine 0.6 MG tablet [Pharmacy Med Name: COLCHICINE 0.6 MG TABS 0.6 Tablet] 90 tablet 0    Sig: TAKE 1 TABLET (0.6 MG TOTAL) BY MOUTH DAILY. TO TREAT GOUT     Endocrinology:  Gout Agents Failed - 01/25/2020  9:09 AM      Failed - Uric Acid in normal range and within 360 days    Uric Acid  Date Value Ref Range Status  09/28/2018 3.4 (L) 3.7 - 8.6 mg/dL Final    Comment:               Therapeutic target for gout patients: <6.0         Passed - Cr in normal range and within 360 days    Creatinine, Ser  Date Value Ref Range Status  08/30/2019 1.26 0.76 - 1.27 mg/dL Final         Passed - Valid encounter within last 12 months    Recent Outpatient Visits          Yesterday Need for influenza vaccination   [place], RPH-CPP   Yesterday Essential hypertension   [place] Ladell Pier, MD   4 months ago Acute pain of left shoulder   [place], Levada Dy M, [place]   10 months ago Essential hypertension   [place], [place], MD   1 year ago Essential hypertension   [place], Stephen L, RPH-CPP      Future Appointments            In 1 week Daisy Blossom, Jarome Matin, [place]   In 1 month [place], MD [place], [facility]   In 3 months Wynetta Emery, Dalbert Batman, MD [place]

## 2020-01-31 ENCOUNTER — Ambulatory Visit (HOSPITAL_COMMUNITY)
Admission: EM | Admit: 2020-01-31 | Discharge: 2020-01-31 | Disposition: A | Discharge status: Home / Self Care | Payer: Medicare HMO

## 2020-01-31 ENCOUNTER — Encounter (HOSPITAL_COMMUNITY): Payer: Self-pay

## 2020-01-31 ENCOUNTER — Other Ambulatory Visit: Payer: Self-pay

## 2020-01-31 DIAGNOSIS — H1131 Conjunctival hemorrhage, right eye: Secondary | ICD-10-CM

## 2020-01-31 DIAGNOSIS — I10 Essential (primary) hypertension: Secondary | ICD-10-CM

## 2020-01-31 DIAGNOSIS — T50Z95A Adverse effect of other vaccines and biological substances, initial encounter: Secondary | ICD-10-CM | POA: Diagnosis not present

## 2020-01-31 NOTE — ED Triage Notes (Signed)
Pt presents with cough, body aches, chills and fever x 4 day; redness sin right eye started today.., States symptoms started after flu shot. Denies shortness of breath, chest pain, dizziness.

## 2020-01-31 NOTE — ED Provider Notes (Addendum)
[place]    CSN: [id number] Arrival date & time: 01/31/20  1137      History   Chief Complaint Chief Complaint  Patient presents with  . Cough  . Fever  . Generalized Body Aches  . Headache    HPI Albert Conto. is a 65 y.o. male Presenting for cough, fevers, body aches, and headache for 4 days following receiving influenza and pneumonia vaccines on the same day 4 days ago.Marland Kitchen History of  Hypertension, gout, OSA, afib, BPH. Today he is feeling much better- fevers/chills, n/v/d, shortness of breath, chest pain, cough, congestion, facial pain, teeth pain, headaches, sore throat, loss of taste/smell, swollen lymph nodes, ear pain. Denies chest pain, shortness of breath, confusion, high fevers.  Fully vaccinated for covid-19.  He presents today with concern for right eye redness. States he woke up this morning with redness of the outer right eye. History of similar issue in left eye 2 years ago which improved on its own.  Denies photophobia, foreign body sensation,  eye crusting in the morning, eye pain, eye pain with movement, injury to eye, vision changes, double vision, excessive tearing, burning eyes, trauma. Denies worst headache of life, vision changes, shortness of breath, chest pain. He wears glasses.   HPI  Past Medical History:  Diagnosis Date  . BPH associated with nocturia 03/12/2018  . BPH with obstruction/lower urinary tract symptoms   . Chronic gout of multiple sites 03/12/2018  . Essential hypertension   . Gout   . Long-term use of aspirin therapy 03/12/2018  . OSA (obstructive sleep apnea)   . Paroxysmal atrial fibrillation [facility])     Patient Active Problem List   Diagnosis Date Noted  . Overweight (BMI 25.0-29.9) 01/24/2020  . OSA (obstructive sleep apnea) 01/24/2020  . History of colon polyps 01/24/2020  . Chronic anticoagulation 03/28/2019  . Paroxysmal atrial fibrillation ([place]) 03/12/2018  . Essential hypertension 03/12/2018  . BPH associated  with nocturia 03/12/2018  . Chronic gout of multiple sites 03/12/2018  . Long-term use of aspirin therapy 03/12/2018  . Chest pain 03/27/2017    Past Surgical History:  Procedure Laterality Date  . LIPOSUCTION         Home Medications    Prior to Admission medications   Medication Sig Start Date End Date Taking? Authorizing Provider  allopurinol (ZYLOPRIM) 300 MG tablet Take 1 tablet (300 mg total) by mouth daily. To treat gout 08/30/19   Argentina Donovan, PA-C  apixaban (ELIQUIS) 5 MG TABS tablet Take 1 tablet (5 mg total) by mouth 2 (two) times daily. 08/30/19   Argentina Donovan, PA-C  colchicine 0.6 MG tablet TAKE 1 TABLET (0.6 MG TOTAL) BY MOUTH DAILY. TO TREAT GOUT 01/25/20   Ladell Pier, MD  diltiazem (CARTIA XT) 300 MG 24 hr capsule Take 1 capsule (300 mg total) by mouth daily. 08/30/19   Argentina Donovan, PA-C  flecainide (TAMBOCOR) 50 MG tablet Take 1 tablet (50 mg total) by mouth daily. 08/30/19   Argentina Donovan, PA-C  irbesartan (AVAPRO) 300 MG tablet Take 1 tablet (300 mg total) by mouth daily. 08/30/19   Argentina Donovan, PA-C  metoprolol succinate (TOPROL-XL) 100 MG 24 hr tablet Take 1 tablet (100 mg total) by mouth daily. Take with or immediately following a meal. 08/30/19   McClung, Dionne Bucy, PA-C  tamsulosin (FLOMAX) 0.4 MG CAPS capsule Take 2 capsules (0.8 mg total) by mouth at bedtime. 01/24/20   Ladell Pier, MD  Family History Family History  Problem Relation Age of Onset  . Diabetes Mother   . Hypertension Father   . Diabetes Sister   . Diabetes Brother   . Heart failure Brother   . Kidney failure Brother     Social History Social History   Tobacco Use  . Smoking status: Former Research scientist (life sciences)  . Smokeless tobacco: Current User    Types: Chew  Vaping Use  . Vaping Use: Never used  Substance Use Topics  . Alcohol use: Not Currently  . Drug use: Not Currently     Allergies   Other   Review of Systems Review of Systems   Constitutional: Negative for appetite change, chills and fever.  HENT: Negative for congestion, ear pain, rhinorrhea, sinus pressure, sinus pain and sore throat.   Eyes: Positive for redness. Negative for photophobia, pain, discharge, itching and visual disturbance.  Respiratory: Negative for cough, chest tightness, shortness of breath and wheezing.   Cardiovascular: Negative for chest pain and palpitations.  Gastrointestinal: Negative for abdominal pain, constipation, diarrhea, nausea and vomiting.  Genitourinary: Negative for dysuria, frequency and urgency.  Musculoskeletal: Negative for myalgias.  Neurological: Negative for dizziness, weakness and headaches.  Psychiatric/Behavioral: Negative for confusion.  All other systems reviewed and are negative.    Physical Exam Triage Vital Signs ED Triage Vitals [01/31/20 1348]  Enc Vitals Group     BP (!) 190/106     Pulse Rate 78     Resp (!) 25     Temp 97.7 F (36.5 C)     Temp Source Oral     SpO2 99 %     Weight      Height      Head Circumference      Peak Flow      Pain Score      Pain Loc      Pain Edu?      Excl. in [place]?    No data found.  Updated Vital Signs BP (!) 176/106 (BP Location: Right Arm)   Pulse 78   Temp 97.7 F (36.5 C) (Oral)   Resp (!) 25   SpO2 99%   Visual Acuity Right Eye Distance: 20/30 (With correction ) Left Eye Distance: 20/30 (With correction ) Bilateral Distance: 20/30 (With correction )  Right Eye Near:   Left Eye Near:    Bilateral Near:     Physical Exam Vitals reviewed.  Constitutional:      General: He is not in acute distress.    Appearance: Normal appearance. He is not ill-appearing.  HENT:     Head: Normocephalic and atraumatic.     Right Ear: Hearing, tympanic membrane, ear canal and external ear normal. No swelling or tenderness. There is no impacted cerumen. No mastoid tenderness. Tympanic membrane is not perforated, erythematous, retracted or bulging.     Left Ear:  Hearing, tympanic membrane, ear canal and external ear normal. No swelling or tenderness. There is no impacted cerumen. No mastoid tenderness. Tympanic membrane is not perforated, erythematous, retracted or bulging.     Nose:     Right Sinus: No maxillary sinus tenderness or frontal sinus tenderness.     Left Sinus: No maxillary sinus tenderness or frontal sinus tenderness.     Mouth/Throat:     Mouth: Mucous membranes are moist.     Pharynx: Uvula midline. No oropharyngeal exudate or posterior oropharyngeal erythema.     Tonsils: No tonsillar exudate.  Eyes:     General: Lids  are normal. Lids are everted, no foreign bodies appreciated. Vision grossly intact. Gaze aligned appropriately. No allergic shiner, visual field deficit or scleral icterus.       Right eye: No foreign body, discharge or hordeolum.        Left eye: No foreign body, discharge or hordeolum.     Extraocular Movements: Extraocular movements intact.     Conjunctiva/sclera:     Right eye: Right conjunctiva is injected. No chemosis, exudate or hemorrhage.    Left eye: Left conjunctiva is not injected. No chemosis, exudate or hemorrhage.    Pupils: Pupils are equal, round, and reactive to light.      Comments: Injection of right lateral eye  Cardiovascular:     Rate and Rhythm: Normal rate and regular rhythm.     Heart sounds: Normal heart sounds.  Pulmonary:     Effort: Pulmonary effort is normal.     Breath sounds: Normal breath sounds and air entry. No wheezing, rhonchi or rales.  Chest:     Chest wall: No tenderness.  Abdominal:     General: Abdomen is flat. Bowel sounds are normal.     Tenderness: There is no abdominal tenderness. There is no guarding or rebound.  Musculoskeletal:     Cervical back: Normal range of motion and neck supple. No rigidity.  Lymphadenopathy:     Cervical: No cervical adenopathy.  Neurological:     General: No focal deficit present.     Mental Status: He is alert and oriented to  person, place, and time. Mental status is at baseline.     Cranial Nerves: Cranial nerves are intact. No cranial nerve deficit or facial asymmetry.     Sensory: Sensation is intact. No sensory deficit.     Motor: Motor function is intact. No weakness.     Coordination: Coordination is intact. Coordination normal.     Gait: Gait is intact. Gait normal.     Comments: CN 2-12 intact. No weakness or numbness in UEs or LEs.  Psychiatric:        Attention and Perception: Attention and perception normal.        Mood and Affect: Mood and affect normal.        Behavior: Behavior normal. Behavior is cooperative.        Thought Content: Thought content normal.        Judgment: Judgment normal.      UC Treatments / Results  Labs (all labs ordered are listed, but only abnormal results are displayed) Labs Reviewed - No data to display  EKG   Radiology No results found.  Procedures Procedures (including critical care time)  Medications Ordered in UC Medications - No data to display  Initial Impression / Assessment and Plan / UC Course  I have reviewed the triage vital signs and the nursing notes.  Pertinent labs & imaging results that were available during my care of the patient were reviewed by me and considered in my medical decision making (see chart for details).     -For your red eye (subconjunctival hemorrhage), treat this at home with over-the-counter eye drops and warm compresses. Follow-up with eye doctor if symptoms don't improve in 1-2 weeks (information provided below). Likely related to elevated BP. -For your elevated blood pressure, continue taking your medications as directed by your primary care provider. Monitor your blood pressure at home. If this continues to be >140/90 at home, follow-up with your PCP.  -For vaccine reaction: symptoms have resolved on  their own. Discussed risks and benefits of covid test today. We discussed that given duration of symptoms, even it  patient did have covid, per CDC guidelines, he is asymptomatic/mildly symptomatic. Given duration of symptoms of 4-5 days, he can go about his daily life as long as he wears a well-fitting mask. Patient verbalizes understanding and defers covid test today.  BP was initially 190/106 but was 176/106 on repeat. He denies headaches, chest pain, vision changes, shortness of breath, worst headache of life.   Head immediately to ED if chest pain, shortness of breath, worst headache of life, vision changes, etc.   Spent over 40 minutes obtaining H&P, performing physical, discussing results, treatment plan and plan for follow-up with patient. Patient agrees with plan.   Final Clinical Impressions(s) / UC Diagnoses   Final diagnoses:  Essential hypertension  Subconjunctival hemorrhage of right eye  Adverse effect of vaccine, initial encounter     Discharge Instructions     -For your red eye (subconjunctival hemorrhage), treat this at home with over-the-counter eye drops and warm compresses. Follow-up with eye doctor if symptoms don't improve in 1-2 weeks (information provided below) -For your elevated blood pressure, continue taking your medications as directed by your primary care provider. Monitor your blood pressure at home. If this continues to be >140/90 at home, follow-up with your PCP.      ED Prescriptions    None     PDMP not reviewed this encounter.   Hazel Sams, PA-C 01/31/20 1440    Hazel Sams, PA-C 01/31/20 1443    Hazel Sams, PA-C 01/31/20 1443

## 2020-01-31 NOTE — Discharge Instructions (Addendum)
-  For your red eye (subconjunctival hemorrhage), treat this at home with over-the-counter eye drops and warm compresses. Follow-up with eye doctor if symptoms don't improve in 1-2 weeks (information provided below) -For your elevated blood pressure, continue taking your medications as directed by your primary care provider. Monitor your blood pressure at home. If this continues to be >140/90 at home, follow-up with your PCP.

## 2020-01-31 NOTE — ED Notes (Signed)
Pt states he is going out to his truck and returning. Notified that his discharge papers were not ready yet. Pt insisted on leaving.

## 2020-01-31 NOTE — ED Notes (Signed)
Provider Phillip Heal, [place] aware of pt vital sign.

## 2020-02-01 ENCOUNTER — Telehealth (INDEPENDENT_AMBULATORY_CARE_PROVIDER_SITE_OTHER): Payer: Medicare HMO | Admitting: Family

## 2020-02-01 ENCOUNTER — Encounter: Payer: Self-pay | Admitting: Family

## 2020-02-01 ENCOUNTER — Ambulatory Visit: Payer: Self-pay | Admitting: *Deleted

## 2020-02-01 ENCOUNTER — Ambulatory Visit: Payer: Medicare HMO | Admitting: Nurse Practitioner

## 2020-02-01 DIAGNOSIS — H1131 Conjunctival hemorrhage, right eye: Secondary | ICD-10-CM

## 2020-02-01 DIAGNOSIS — I10 Essential (primary) hypertension: Secondary | ICD-10-CM | POA: Diagnosis not present

## 2020-02-01 DIAGNOSIS — T50Z95D Adverse effect of other vaccines and biological substances, subsequent encounter: Secondary | ICD-10-CM

## 2020-02-01 NOTE — Progress Notes (Signed)
Pt has right eye hemorrhage  Went to UC yesterday presenting with cough, fever, body aches, and pain  Did not COVID test Pt feels started 4 days ago from getting flu and pneumonia vaccines same day

## 2020-02-01 NOTE — Telephone Encounter (Signed)
FYI  Pt had a virtual appt with Amy today

## 2020-02-01 NOTE — Telephone Encounter (Signed)
Reports he was seen at [facility] yesterday- elevated BP- reports eye in blood shot. BP 175/106 P 75 today. Appointment scheduled.  Reason for Disposition  Systolic BP  >= 124 OR Diastolic >= 580  Answer Assessment - Initial Assessment Questions 1. BLOOD PRESSURE: "What is the blood pressure?" "Did you take at least two measurements 5 minutes apart?"     175/106 2. ONSET: "When did you take your blood pressure?"     8:30 3. HOW: "How did you obtain the blood pressure?" (e.g., visiting nurse, automatic home BP monitor)     Automatic cuff- arm 4. HISTORY: "Do you have a history of high blood pressure?"     yes 5. MEDICATIONS: "Are you taking any medications for blood pressure?" "Have you missed any doses recently?"     No missed medication 6. OTHER SYMPTOMS: "Do you have any symptoms?" (e.g., headache, chest pain, blurred vision, difficulty breathing, weakness)     Tuesday night- headache, eye red- blood vessels red 7. PREGNANCY: "Is there any chance you are pregnant?" "When was your last menstrual period?"     n/a  Protocols used: BLOOD PRESSURE - HIGH-A-AH

## 2020-02-01 NOTE — Patient Instructions (Signed)
Subconjunctival Hemorrhage Subconjunctival hemorrhage is bleeding that happens between the white part of your eye (sclera) and the clear membrane that covers the outside of your eye (conjunctiva). There are many tiny blood vessels near the surface of your eye. A subconjunctival hemorrhage happens when one or more of these vessels breaks and bleeds, causing a red patch to appear on your eye. This is similar to a bruise. Depending on the amount of bleeding, the red patch may only cover a small area of your eye or it may cover the entire visible part of the sclera. If a lot of blood collects under the conjunctiva, there may also be swelling. Subconjunctival hemorrhages do not affect your vision or cause pain, but your eye may feel irritated if there is swelling. Subconjunctival hemorrhages usually do not require treatment, and they usually disappear on their own within two weeks. What are the causes? This condition may be caused by:  Mild trauma, such as rubbing your eye too hard.  Blunt injuries, such as from playing sports or having contact with a deployed airbag.  Coughing, sneezing, or vomiting.  Straining, such as when lifting a heavy object.  High blood pressure.  Recent eye surgery.  Diabetes.  Certain medicines, especially blood thinners (anticoagulants).  Other conditions, such as eye tumors, bleeding disorders, or blood vessel abnormalities. Subconjunctival hemorrhages can also happen without an obvious cause. What are the signs or symptoms? Symptoms of this condition include:  A bright red or dark red patch on the white part of the eye. The red area may: ? Spread out to cover a larger area of the eye before it goes away. ? Turn brownish-yellow before it goes away.  Swelling around the eye.  Mild eye irritation.   How is this diagnosed? This condition is diagnosed with a physical exam. If your subconjunctival hemorrhage was caused by trauma, your health care provider may  refer you to an eye specialist (ophthalmologist) or another specialist to check for other injuries. You may have other tests, including:  An eye exam.  A blood pressure check.  Blood tests to check for bleeding disorders. If your subconjunctival hemorrhage was caused by trauma, X-rays or a CT scan may be done to check for other injuries. How is this treated? Usually, treatment is not needed for this condition. If you have discomfort, your health care provider may recommend eye drops or cold compresses. Follow these instructions at home:  Take over-the-counter and prescription medicines only as directed by your health care provider.  Use eye drops or cold compresses to help with discomfort as directed by your health care provider.  Avoid activities, things, and environments that may irritate or injure your eye.  Keep all follow-up visits as told by your health care provider. This is important. Contact a health care provider if:  You have pain in your eye.  The bleeding does not go away within 3 weeks.  You keep getting new subconjunctival hemorrhages. Get help right away if:  Your vision changes or you have difficulty seeing.  You suddenly develop severe sensitivity to light.  You develop a severe headache, persistent vomiting, confusion, or abnormal tiredness (lethargy).  Your eye seems to bulge or protrude from your eye socket.  You develop unexplained bruises on your body.  You have unexplained bleeding in another area of your body. Summary  Subconjunctival hemorrhage is bleeding that happens between the white part of your eye and the clear membrane that covers the outside of your eye.    This condition is similar to a bruise.  Subconjunctival hemorrhages usually do not require treatment, and they usually disappear on their own within two weeks.  Use eye drops or cold compresses to help with discomfort as directed by your health care provider. This information is not  intended to replace advice given to you by your health care provider. Make sure you discuss any questions you have with your health care provider. Document Revised: 06/14/2018 Document Reviewed: 09/28/2017 Elsevier Patient Education  2021 Elsevier Inc.  

## 2020-02-01 NOTE — Progress Notes (Signed)
Virtual Visit via Telephone Note  I connected with Albert Bon., on 02/01/2020 at 10:42 AM by telephone due to the COVID-19 pandemic and verified that I am speaking with the correct person using two identifiers.  Due to current restrictions/limitations of in-office visits due to the COVID-19 pandemic, this scheduled clinical appointment was converted to a telehealth visit.   Consent: I discussed the limitations, risks, security and privacy concerns of performing an evaluation and management service by telephone and the availability of in person appointments. I also discussed with the patient that there may be a patient responsible charge related to this service. The patient expressed understanding and agreed to proceed.  Location of Patient: Home  Location of Provider: [facility] [place] participating in Telemedicine visit: Bobie Kistler. Durene Fruits, NP Elmon Else, CMA  History of Present Illness: Albert Good is a 65 year-old male who presents with hypertension and subconjunctival hemorrhage follow-up.   1. HYPERTENSION FOLLOW-UP: 01/24/2020: Visit with Dr. Wynetta Emery. Follow-up in 2 weeks with the clinical pharmacist for blood pressure recheck. Continued on Avapro, Metoprolol, and Diltiazem. If any medication needs to be added, avoid diuretic if possible given his history of chronic gout of multiple joints.  01/31/2020: Visit at [facility] with hypertension. Plan to continue taking your medications as directed by your primary care provider. Monitor your blood pressure at home and follow-up with your PCP.   02/01/2020: Currently taking: see medication list Have you taken your blood pressure today: [x]  Yes, 175/106  Med Adherence: [x]  Yes    []  No Medication side effects: []  Yes    [x]  No Adherence with salt restriction: [x]  Yes    []  No Exercise: Yes [x]  No []  Home Monitoring?: [x]  Yes    []   No Monitoring Frequency: []  Yes    [x]  No, reports only check blood pressure when not feeling well Smoking []  Yes [x]  No SOB? []  Yes    [x]  No Chest Pain?: []  Yes    [x]  No Leg swelling?: []  Yes    [x]  No Headaches?: []  Yes    [x]  No Dizziness? [x]  Yes, sometimes  2. SUBCONJUNCTIVAL HEMORRHAGE RIGHT EYE FOLLOW-UP: 01/31/2020: Visit at [facility] at [facility] with red eye. Plan to treat this at home with over-the-counter eye drops and warm compresses. Follow-up with eye doctor if symptoms don't improve in 1-2 weeks. Likely related to elevated BP.  02/01/2020: Today reports right appears with more red. Would like to see eye doctor sooner than 2 weeks.  Location: right eye   Onset: sudden  Discharge: No Pain:  No  Photophobia: No Decreased Vision: No  3. Adverse effect of vaccine, subsequent encounter: - Visit 01/31/2020 at [facility] at [facility]. Discussed risks and benefits of Covid test today. Discussed that given duration of symptoms, even if patient did have Covid, per CDC guidelines, he is asymptomatic/mildly symptomatic. Given duration of symptoms of 4-5 days, he can go about his daily life as long as he wears a well-fitting mask. Patient verbalizes understanding and deferred Covid test today.  Past Medical History:  Diagnosis Date   BPH associated with nocturia 03/12/2018   BPH with obstruction/lower urinary tract symptoms    Chronic gout of multiple sites 03/12/2018   Essential hypertension    Gout    Long-term use of aspirin therapy 03/12/2018   OSA (obstructive sleep apnea)    Paroxysmal atrial fibrillation (  [place])    Allergies  Allergen Reactions   Other Hives    HAIR DYE    Current Outpatient Medications on File Prior to Visit  Medication Sig Dispense Refill   allopurinol (ZYLOPRIM) 300 MG tablet Take 1 tablet (300 mg total) by mouth daily. To treat gout 90 tablet 1   apixaban (ELIQUIS) 5 MG TABS tablet Take 1 tablet (5 mg  total) by mouth 2 (two) times daily. 60 tablet 11   colchicine 0.6 MG tablet TAKE 1 TABLET (0.6 MG TOTAL) BY MOUTH DAILY. TO TREAT GOUT 90 tablet 0   diltiazem (CARTIA XT) 300 MG 24 hr capsule Take 1 capsule (300 mg total) by mouth daily. 90 capsule 3   flecainide (TAMBOCOR) 50 MG tablet Take 1 tablet (50 mg total) by mouth daily. 90 tablet 3   irbesartan (AVAPRO) 300 MG tablet Take 1 tablet (300 mg total) by mouth daily. 90 tablet 3   metoprolol succinate (TOPROL-XL) 100 MG 24 hr tablet Take 1 tablet (100 mg total) by mouth daily. Take with or immediately following a meal. 90 tablet 3   tamsulosin (FLOMAX) 0.4 MG CAPS capsule Take 2 capsules (0.8 mg total) by mouth at bedtime. 180 capsule 1   No current facility-administered medications on file prior to visit.    Observations/Objective: Alert and oriented x 3. Not in acute distress. Physical examination not completed as this is a telemedicine visit.  Assessment and Plan: 1. Essential hypertension: - Visit 01/24/2020 with primary physician with hypertension. Continued Diltiazem, Irbesartan, and Metoprolol Succinate. Follow-up in 2 weeks for blood pressure check. - Visit 01/31/2020 at Urgent Care with hypertension. Plan to follow-up with primary care. - Today endorses taking Diltiazem, Irbesartan, and Metoprolol Succinate without missing doses.  - Continue current regimen. - Patient asymptomatic without chest pressure, chest pain, palpitations, shortness of breath, and worst headache of life. - Keep appointment for blood pressure check with clinical pharmacist scheduled for 02/07/2020. - Counseled on blood pressure goal of less than 130/80, low-sodium, DASH diet, medication compliance, 150 minutes of moderate intensity exercise per week as tolerated. Discussed medication compliance, adverse effects. - Patient was given clear instructions to go to Emergency Department or return to medical center if symptoms don't improve, worsen, or new  problems develop.The patient verbalized understanding.  2. Subconjunctival hemorrhage of right eye: - Visit 01/31/2020 at Urgent Care with red eye. Plan to treat this at home with over-the-counter eye drops and warm compresses. Follow-up with eye doctor if symptoms don't improve in 1-2 weeks. Likely related to elevated blood pressure.  - Today patient reports eye has more red than yesterday. Would like to see eye doctor sooner than 2 weeks. - Per patient request referral to Ophthalmology for further evaluation and management. - Patient was given clear instructions to go to Emergency Department or return to medical center if symptoms don't improve, worsen, or new problems develop.The patient verbalized understanding. - Ambulatory referral to Ophthalmology  3. Adverse effect of vaccine, subsequent encounter: - Visit 01/31/2020 at Urgent Care. Discussed risks and benefits of Covid test today. Discussed that given duration of symptoms, even if patient did have Covid, per CDC guidelines, he is asymptomatic/mildly symptomatic. Given duration of symptoms of 4-5 days, he can go about his daily life as long as he wears a well-fitting mask. Patient verbalizes understanding and deferred Covid test today. - Today patient reports he is feeling the same since yesterday. Declines Covid testing at this time. - Patient was given clear instructions to  go to Emergency Department or return to medical center if symptoms don't improve, worsen, or new problems develop.The patient verbalized understanding.   Follow Up Instructions: Keep appointments with clinical pharmacist and primary physician.   Patient was given clear instructions to go to Emergency Department or return to medical center if symptoms don't improve, worsen, or new problems develop.The patient verbalized understanding.  I discussed the assessment and treatment plan with the patient. The patient was provided an opportunity to ask questions and all were  answered. The patient agreed with the plan and demonstrated an understanding of the instructions.   The patient was advised to call back or seek an in-person evaluation if the symptoms worsen or if the condition fails to improve as anticipated.   I provided 18 minutes total of non-face-to-face time during this encounter including median intraservice time, reviewing previous notes, labs, imaging, medications, management and patient verbalized understanding.   Camillia Herter, NP  [facility] [place], [place] 02/01/2020, 10:42 AM

## 2020-02-07 ENCOUNTER — Ambulatory Visit: Payer: Medicare HMO | Attending: Internal Medicine | Admitting: Pharmacist

## 2020-02-07 ENCOUNTER — Other Ambulatory Visit: Payer: Self-pay

## 2020-02-07 ENCOUNTER — Encounter: Payer: Self-pay | Admitting: Pharmacist

## 2020-02-07 VITALS — BP 108/67 | HR 84

## 2020-02-07 DIAGNOSIS — I10 Essential (primary) hypertension: Secondary | ICD-10-CM | POA: Diagnosis not present

## 2020-02-07 DIAGNOSIS — N401 Enlarged prostate with lower urinary tract symptoms: Secondary | ICD-10-CM | POA: Diagnosis not present

## 2020-02-07 DIAGNOSIS — R3912 Poor urinary stream: Secondary | ICD-10-CM | POA: Diagnosis not present

## 2020-02-07 DIAGNOSIS — R972 Elevated prostate specific antigen [PSA]: Secondary | ICD-10-CM | POA: Diagnosis not present

## 2020-02-07 DIAGNOSIS — R351 Nocturia: Secondary | ICD-10-CM | POA: Diagnosis not present

## 2020-02-07 NOTE — Progress Notes (Signed)
   S:    PCP: Dr. Wynetta Emery  Patient arrives in good spirits.  Presents to the clinic for hypertension evaluation, counseling, and management. Patient was referred and last seen by Primary Care Provider on 01/24/2020.    Medication adherence reported.  Current BP Medications include:  Diltiazem 300 mg XT, irbesartan 300 mg daily, Toprol-XL 100 mg daily  Dietary habits include: endorses compliance with salt restriction; denies drinking caffeine  Exercise habits include: lifts weights at home since the onset of the pandemic Family / Social history:  FHx: DM, HTN, CHF, kidney failure  Tobacco: former smoker Alcohol: not currently    O:  Vitals:   02/07/20 1620  BP: 108/67  Pulse: 84   Home BP readings: none  Last 3 Office BP readings: BP Readings from Last 3 Encounters:  02/07/20 108/67  01/31/20 (!) 176/106  01/24/20 (!) 142/90    BMET    Component Value Date/Time   NA 138 08/30/2019 0943   K 4.0 08/30/2019 0943   CL 99 08/30/2019 0943   CO2 27 08/30/2019 0943   GLUCOSE 86 08/30/2019 0943   BUN 10 08/30/2019 0943   CREATININE 1.26 08/30/2019 0943   CALCIUM 9.1 08/30/2019 0943   GFRNONAA 59 (L) 08/30/2019 0943   GFRAA 69 08/30/2019 0943    Renal function: CrCl cannot be calculated (Patient's most recent lab result is older than the maximum 21 days allowed.).  Clinical ASCVD: No  The ASCVD Risk score Mikey Bussing DC Jr., et al., 2013) failed to calculate for the following reasons:   Unable to determine if patient is Non-Hispanic African American   A/P: Hypertension longstanding currently at goal on current medications. BP Goal = < 130/80 mmHg. Medication adherence reported.  -Continued current medications.  -Counseled on lifestyle modifications for blood pressure control including reduced dietary sodium, increased exercise, adequate sleep.  Results reviewed and written information provided. Total time in face-to-face counseling 15 minutes.   F/U Clinic Visit in May  with PCP.  Benard Halsted, PharmD, [facility] [place], [place] [phone]

## 2020-02-14 MED FILL — ELIQUIS 5 MG TABLET: 5 | 30 days supply | Qty: 60 | Fill #5

## 2020-02-20 ENCOUNTER — Encounter: Payer: Self-pay | Admitting: Internal Medicine

## 2020-02-20 MED FILL — COLCHICINE 0.6 MG TABS: 0.6 | 30 days supply | Qty: 30 | Fill #1

## 2020-02-20 MED FILL — METOPROLOL SUCCINATE ER 100: 100 | 90 days supply | Qty: 90 | Fill #3

## 2020-02-20 NOTE — Progress Notes (Signed)
Colonoscopy reports received from Dr. Genia Plants of [facility] [place] [place], [place]. Patient had colonoscopy 03/05/2011.  Total of 4 polyps removed all of which were tubular adenoma with no high-grade dysplasia.  3 were removed from the descending colon and one from the transverse colon. Colonoscopy done 12/27/2013.  4 polyps removed all of which were tubular adenomas without high-grade dysplasia. Colonoscopy done 09/02/2016.  No polyps seen.  Diverticulosis of large intestines noted.  Mildly inflamed internal hemorrhoids.  Spasms throughout that may be reflective of irritable bowel syndrome.

## 2020-03-11 NOTE — Progress Notes (Signed)
Cardiology Office Note   Date:  03/12/2020   ID:  Albert Good., DOB 1955/11/11, MRN [id number]  PCP:  Ladell Pier, MD  Cardiologist:   Dorris Carnes, MD    F/U of PAF and HTN     History of Present Illness: Albert Good. is a 65 y.o. male with a history of  PAF, HTN and OSA (not on CPAP)    Patient diagnosed with afib in [place] (Dr Sammuel Cooper, [place])  Cardioverted a couple times   Started on Flecanide   Stress test in 2016 normal  LVEF normal  .   I saw the pt in [place] 2021  Since seen he says he is feeling good   He does note feeling his heart race occasionally   Episodes last about 5 to 10 min   No dizziness   Usually at night  He denies CP   No SOB   No dizziness Back to working      Currently is not using CPAP   Having problems gettng parts   Pt is back to working   Works outisde   No CP   No dizziness   Current Meds  Medication Sig  . allopurinol (ZYLOPRIM) 300 MG tablet Take 1 tablet (300 mg total) by mouth daily. To treat gout  . apixaban (ELIQUIS) 5 MG TABS tablet Take 1 tablet (5 mg total) by mouth 2 (two) times daily.  . colchicine 0.6 MG tablet TAKE 1 TABLET (0.6 MG TOTAL) BY MOUTH DAILY. TO TREAT GOUT  . diltiazem (CARTIA XT) 300 MG 24 hr capsule Take 1 capsule (300 mg total) by mouth daily.  . flecainide (TAMBOCOR) 50 MG tablet Take 1 tablet (50 mg total) by mouth daily.  . irbesartan (AVAPRO) 300 MG tablet Take 1 tablet (300 mg total) by mouth daily.  . metoprolol succinate (TOPROL-XL) 100 MG 24 hr tablet Take 1 tablet (100 mg total) by mouth daily. Take with or immediately following a meal.  . tamsulosin (FLOMAX) 0.4 MG CAPS capsule Take 2 capsules (0.8 mg total) by mouth at bedtime.     Allergies:   Other   Past Medical History:  Diagnosis Date  . BPH associated with nocturia 03/12/2018  . BPH with obstruction/lower urinary tract symptoms   . Chronic gout of multiple sites 03/12/2018  . Essential hypertension   . Gout   .  Long-term use of aspirin therapy 03/12/2018  . OSA (obstructive sleep apnea)   . Paroxysmal atrial fibrillation [facility])     Past Surgical History:  Procedure Laterality Date  . LIPOSUCTION       Social History:  The patient  reports that he has quit smoking. His smokeless tobacco use includes chew. He reports previous alcohol use. He reports previous drug use.   Family History:  The patient's family history includes Diabetes in his brother, mother, and sister; Heart failure in his brother; Hypertension in his father; Kidney failure in his brother.    ROS:  Please see the history of present illness. All other systems are reviewed and  Negative to the above problem except as noted.    PHYSICAL EXAM: VS:  BP 130/76   Pulse (!) 49   Ht 6\' 2"  (1.88 m)   Wt 224 lb 3.2 oz (101.7 kg)   SpO2 95%   BMI 28.79 kg/m   GEN: Overweight 64 yo in no acute distress  HEENT: normal  Neck: no JVD, no carotid bruits  Cardiac: RRR;  no murmurs  NO  LE edema  Respiratory:  clear to auscultation bilaterally,  GI: soft, nontender, nondistended, + BS  No hepatomegaly  MS: no deformity Moving all extremities   Skin: warm and dry, no rash Neuro:  Strength and sensation are intact Psych: euthymic mood, full affect   EKG:  EKG is ordered   SB 49 bpm     Lipid Panel    Component Value Date/Time   CHOL 161 08/30/2019 0943   TRIG 71 08/30/2019 0943   HDL 65 08/30/2019 0943   CHOLHDL 2.5 08/30/2019 0943   LDLCALC 82 08/30/2019 0943      Wt Readings from Last 3 Encounters:  03/12/20 224 lb 3.2 oz (101.7 kg)  01/24/20 227 lb 9.6 oz (103.2 kg)  01/19/20 215 lb (97.5 kg)      ASSESSMENT AND PLAN: 1  PAF Still having spells of palpitations   Will set up for monitor     2  HTN   BP is controlled    3  OSA  Will see about device  Check:  CBC,BMET today    Stay active   Stay hydrated   Plan for f/u next fall     Current medicines are reviewed at length with the patient today.  The patient  does not have concerns regarding medicines.  Signed, Dorris Carnes, MD  03/12/2020 3:11 PM    [facility] [place], [place], [place]  [address] Phone: [phone]; Fax: [phone]

## 2020-03-12 ENCOUNTER — Encounter: Payer: Self-pay | Admitting: Internal Medicine

## 2020-03-12 ENCOUNTER — Ambulatory Visit (INDEPENDENT_AMBULATORY_CARE_PROVIDER_SITE_OTHER): Payer: Medicare HMO

## 2020-03-12 ENCOUNTER — Other Ambulatory Visit: Payer: Self-pay

## 2020-03-12 ENCOUNTER — Other Ambulatory Visit: Payer: Self-pay | Admitting: Internal Medicine

## 2020-03-12 ENCOUNTER — Encounter: Payer: Self-pay | Admitting: *Deleted

## 2020-03-12 ENCOUNTER — Ambulatory Visit: Payer: Medicare HMO | Admitting: Internal Medicine

## 2020-03-12 VITALS — BP 130/76 | HR 49 | Ht 74.0 in | Wt 224.2 lb

## 2020-03-12 DIAGNOSIS — R002 Palpitations: Secondary | ICD-10-CM

## 2020-03-12 DIAGNOSIS — I480 Paroxysmal atrial fibrillation: Secondary | ICD-10-CM

## 2020-03-12 DIAGNOSIS — I10 Essential (primary) hypertension: Secondary | ICD-10-CM

## 2020-03-12 LAB — CBC
Hematocrit: 40.6 % (ref 37.5–51.0)
Hemoglobin: 14.1 g/dL (ref 13.0–17.7)
MCH: 29 pg (ref 26.6–33.0)
MCHC: 34.7 g/dL (ref 31.5–35.7)
MCV: 83 fL (ref 79–97)
Platelets: 199 10*3/uL (ref 150–450)
RBC: 4.87 x10E6/uL (ref 4.14–5.80)
RDW: 14.3 % (ref 11.6–15.4)
WBC: 5.7 10*3/uL (ref 3.4–10.8)

## 2020-03-12 LAB — BASIC METABOLIC PANEL
BUN/Creatinine Ratio: 11 (ref 10–24)
BUN: 14 mg/dL (ref 8–27)
CO2: 26 mmol/L (ref 20–29)
Calcium: 9.1 mg/dL (ref 8.6–10.2)
Chloride: 104 mmol/L (ref 96–106)
Creatinine, Ser: 1.25 mg/dL (ref 0.76–1.27)
Glucose: 79 mg/dL (ref 65–99)
Potassium: 4.2 mmol/L (ref 3.5–5.2)
Sodium: 138 mmol/L (ref 134–144)
eGFR: 64 mL/min/{1.73_m2} (ref >59)

## 2020-03-12 MED ORDER — FLECAINIDE ACETATE 50 MG PO TABS: 50.0000 mg | ORAL_TABLET | Freq: Every day | ORAL | 3 refills | Status: DC

## 2020-03-12 MED ORDER — METOPROLOL SUCCINATE ER 100 MG PO TB24: 100.0000 mg | ORAL_TABLET | Freq: Every day | ORAL | 3 refills | Status: DC

## 2020-03-12 MED ORDER — IRBESARTAN 300 MG PO TABS: 300.0000 mg | ORAL_TABLET | Freq: Every day | ORAL | 3 refills | Status: DC

## 2020-03-12 MED ORDER — DILTIAZEM HCL ER COATED BEADS 300 MG PO CP24: 300.0000 mg | ORAL_CAPSULE | Freq: Every day | ORAL | 3 refills | Status: DC

## 2020-03-12 MED FILL — IRBESARTAN 300 MG TABS: 300 | 90 days supply | Qty: 90 | Fill #0

## 2020-03-12 NOTE — Progress Notes (Signed)
Patient ID: Albert Good., male   DOB: Oct 20, 1955, 65 y.o.   MRN: [id number] Patient enrolled for [employer] to ship a 14 day ZIO XT long term holter monitor to his home.

## 2020-03-12 NOTE — Patient Instructions (Addendum)
Medication Instructions:  No changes *If you need a refill on your cardiac medications before your next appointment, please call your pharmacy*   Lab Work: BMET, CBC  If you have labs (blood work) drawn today and your tests are completely normal, you will receive your results only by: Marland Kitchen MyChart Message (if you have MyChart) OR . A paper copy in the mail If you have any lab test that is abnormal or we need to change your treatment, we will call you to review the results.   Testing/Procedures: [place]n [place]- Long Term Monitor Instructions   Your physician has requested you wear your ZIO patch monitor 14 days.   This is a single patch monitor.  [employer] supplies one patch monitor per enrollment.  Additional stickers are not available.   Please do not apply patch if you will be having a Nuclear Stress Test, Echocardiogram, Cardiac CT, MRI, or Chest Xray during the time frame you would be wearing the monitor. The patch cannot be worn during these tests.  You cannot remove and re-apply the ZIO XT patch monitor.   Your ZIO patch monitor will be sent USPS Priority mail from [facility] directly to your home address. The monitor may also be mailed to a PO BOX if home delivery is not available.   It may take 3-5 days to receive your monitor after you have been enrolled.   Once you have received you monitor, please review enclosed instructions.  Your monitor has already been registered assigning a specific monitor serial # to you.   Applying the monitor   Shave hair from upper left chest.   Hold abrader disc by orange tab.  Rub abrader in 40 strokes over left upper chest as indicated in your monitor instructions.   Clean area with 4 enclosed alcohol pads .  Use all pads to assure are is cleaned thoroughly.  Let dry.   Apply patch as indicated in monitor instructions.  Patch will be place under collarbone on left side of chest with arrow pointing upward.   Rub patch adhesive wings for 2  minutes.Remove white label marked "1".  Remove white label marked "2".  Rub patch adhesive wings for 2 additional minutes.   While looking in a mirror, press and release button in center of patch.  A small green light will flash 3-4 times .  This will be your only indicator the monitor has been turned on.     Do not shower for the first 24 hours.  You may shower after the first 24 hours.   Press button if you feel a symptom. You will hear a small click.  Record Date, Time and Symptom in the Patient Log Book.   When you are ready to remove patch, follow instructions on last 2 pages of Patient Log Book.  Stick patch monitor onto last page of Patient Log Book.   Place Patient Log Book in [place] box.  Use locking tab on box and tape box closed securely.  The [employer] and [employer] has [employer] on it.  Please place in mailbox as soon as possible.  Your physician should have your test results approximately 7 days after the monitor has been mailed back to [facility].   Call [facility] at [phone] if you have questions regarding your ZIO XT patch monitor.  Call them immediately if you see an orange light blinking on your monitor.   If your monitor falls off in less than 4 days contact  our Monitor department at [phone].  If your monitor becomes loose or falls off after 4 days call [employer] at [phone] for suggestions on securing your monitor.    Follow-Up: At [facility], you and your health needs are our priority.  As part of our continuing mission to provide you with exceptional heart care, we have created designated Provider Care Teams.  These Care Teams include your primary Cardiologist (physician) and Advanced Practice Providers (APPs -  Physician Assistants and Nurse Practitioners) who all work together to provide you with the care you need, when you need it.  We recommend signing up for the patient portal called "MyChart".  Sign up information is provided  on this After Visit Summary.  MyChart is used to connect with patients for Virtual Visits (Telemedicine).  Patients are able to view lab/test results, encounter notes, upcoming appointments, etc.  Non-urgent messages can be sent to your provider as well.   To learn more about what you can do with MyChart, go to [email or web address].    Your next appointment:   7 month(s)  The format for your next appointment:   In Person  Provider:   You may see Dorris Carnes, MD or one of the following Advanced Practice Providers on your designated Care Team:    Richardson Dopp, PA-C  Robbie Lis, [place]  Other Instructions

## 2020-03-14 ENCOUNTER — Encounter: Payer: Self-pay | Admitting: *Deleted

## 2020-03-14 ENCOUNTER — Other Ambulatory Visit: Payer: Self-pay | Admitting: Internal Medicine

## 2020-03-14 ENCOUNTER — Other Ambulatory Visit: Payer: Self-pay | Admitting: Physician Assistant

## 2020-03-14 ENCOUNTER — Ambulatory Visit: Payer: 59 | Admitting: Internal Medicine

## 2020-03-14 DIAGNOSIS — M1A09X0 Idiopathic chronic gout, multiple sites, without tophus (tophi): Secondary | ICD-10-CM

## 2020-03-14 MED FILL — ELIQUIS 5 MG TABLET: 5 | 30 days supply | Qty: 60 | Fill #6

## 2020-03-14 MED FILL — ALLOPURINOL 300 MG TAB: 300 | 90 days supply | Qty: 90 | Fill #0

## 2020-03-14 NOTE — Progress Notes (Signed)
Letter of normal results placed in outgoing mail.

## 2020-03-14 NOTE — Telephone Encounter (Signed)
Requested Prescriptions  Pending Prescriptions Disp Refills  . allopurinol (ZYLOPRIM) 300 MG tablet [Pharmacy Med Name: ALLOPURINOL 300 MG TAB 300 Tablet] 90 tablet 1    Sig: TAKE 1 TABLET (300 MG TOTAL) BY MOUTH DAILY. TO TREAT GOUT     Endocrinology:  Gout Agents Failed - 03/14/2020  8:58 AM      Failed - Uric Acid in normal range and within 360 days    Uric Acid  Date Value Ref Range Status  09/28/2018 3.4 (L) 3.7 - 8.6 mg/dL Final    Comment:               Therapeutic target for gout patients: <6.0         Passed - Cr in normal range and within 360 days    Creatinine, Ser  Date Value Ref Range Status  03/12/2020 1.25 0.76 - 1.27 mg/dL Final         Passed - Valid encounter within last 12 months    Recent Outpatient Visits          1 month ago Essential hypertension   [place], Jarome Matin, RPH-CPP   1 month ago Need for influenza vaccination   [place], Jarome Matin, RPH-CPP   1 month ago Essential hypertension   [place], MD   6 months ago Acute pain of left shoulder   [place], Levada Dy M, [place]   11 months ago Essential hypertension   [place], MD      Future Appointments            In 2 months Wynetta Emery, Dalbert Batman, MD [place]

## 2020-03-15 DIAGNOSIS — I10 Essential (primary) hypertension: Secondary | ICD-10-CM

## 2020-03-15 DIAGNOSIS — R002 Palpitations: Secondary | ICD-10-CM

## 2020-03-15 DIAGNOSIS — I480 Paroxysmal atrial fibrillation: Secondary | ICD-10-CM | POA: Diagnosis not present

## 2020-03-17 DIAGNOSIS — E349 Endocrine disorder, unspecified: Secondary | ICD-10-CM | POA: Diagnosis not present

## 2020-03-17 DIAGNOSIS — N401 Enlarged prostate with lower urinary tract symptoms: Secondary | ICD-10-CM | POA: Diagnosis not present

## 2020-03-17 DIAGNOSIS — R3912 Poor urinary stream: Secondary | ICD-10-CM | POA: Diagnosis not present

## 2020-04-01 ENCOUNTER — Other Ambulatory Visit: Payer: Self-pay | Admitting: Urology

## 2020-04-10 MED FILL — ELIQUIS 5 MG TABLET: 5 | 30 days supply | Qty: 60 | Fill #7

## 2020-04-10 MED FILL — FLECAINIDE ACETATE 50 MG TA: 50 | 90 days supply | Qty: 90 | Fill #0

## 2020-04-10 MED FILL — CARTIA XT 300 MG CAPSULE SA: 300 | 90 days supply | Qty: 90 | Fill #0

## 2020-04-11 ENCOUNTER — Telehealth: Payer: Self-pay | Admitting: *Deleted

## 2020-04-11 NOTE — Telephone Encounter (Signed)
-----   Message from Rodman Key, RN sent at 04/08/2020  4:10 PM EDT ----- [name],  Can you help?  Dr. Harrington Challenger wants to know if pt is using CPAP? Thanks, Michalene ----- Message ----- From: Fay Records, MD Sent: 04/07/2020   9:36 PM EDT To: Rodman Key, RN  I don't think pt has CPAP  How do I find details on this?

## 2020-04-11 NOTE — Telephone Encounter (Signed)
Reached out to the paient to ask about his cpap machine.patient states he is still waiting on his cpap machine. [employer] has a back log of patients they are working and will call the patient when a machine comes in for him.  Thanks, [name]

## 2020-04-12 ENCOUNTER — Other Ambulatory Visit: Payer: Self-pay

## 2020-04-14 DIAGNOSIS — I480 Paroxysmal atrial fibrillation: Secondary | ICD-10-CM | POA: Diagnosis not present

## 2020-04-14 DIAGNOSIS — I10 Essential (primary) hypertension: Secondary | ICD-10-CM | POA: Diagnosis not present

## 2020-04-14 DIAGNOSIS — R002 Palpitations: Secondary | ICD-10-CM | POA: Diagnosis not present

## 2020-04-17 ENCOUNTER — Other Ambulatory Visit: Payer: Self-pay

## 2020-04-17 MED ORDER — CLOMIPHENE CITRATE 50 MG PO TABS
25.0000 mg | ORAL_TABLET | Freq: Every day | ORAL | 11 refills | Status: DC
  Filled 2020-04-17: qty 15, 30d supply, fill #0

## 2020-04-28 ENCOUNTER — Other Ambulatory Visit: Payer: Self-pay

## 2020-04-28 ENCOUNTER — Other Ambulatory Visit: Payer: Self-pay | Admitting: Internal Medicine

## 2020-04-28 DIAGNOSIS — M1A09X0 Idiopathic chronic gout, multiple sites, without tophus (tophi): Secondary | ICD-10-CM

## 2020-04-28 NOTE — Telephone Encounter (Signed)
Requested medication (s) are due for refill today: no  Requested medication (s) are on the active medication list: yes   Last refill:  03/28/2020  Future visit scheduled: yes   Notes to clinic:  this refill cannot be delegated   Requested Prescriptions  Pending Prescriptions Disp Refills   colchicine 0.6 MG tablet 90 tablet 0    Sig: TAKE 1 TABLET (0.6 MG TOTAL) BY MOUTH DAILY. TO TREAT GOUT      Endocrinology:  Gout Agents Failed - 04/28/2020  9:06 AM      Failed - Uric Acid in normal range and within 360 days    Uric Acid  Date Value Ref Range Status  09/28/2018 3.4 (L) 3.7 - 8.6 mg/dL Final    Comment:               Therapeutic target for gout patients: <6.0          Passed - Cr in normal range and within 360 days    Creatinine, Ser  Date Value Ref Range Status  03/12/2020 1.25 0.76 - 1.27 mg/dL Final          Passed - Valid encounter within last 12 months    Recent Outpatient Visits           2 months ago Essential hypertension   [employer], Stephen L, RPH-CPP   3 months ago Need for influenza vaccination   [place], Jarome Matin, RPH-CPP   3 months ago Essential hypertension   [place], MD   8 months ago Acute pain of left shoulder   [place], Dionne Bucy, [place]   1 year ago Essential hypertension   [place], MD       Future Appointments             In 3 weeks Ladell Pier, MD [place]

## 2020-04-29 ENCOUNTER — Other Ambulatory Visit: Payer: Self-pay

## 2020-04-29 MED ORDER — COLCHICINE 0.6 MG PO TABS
0.6000 mg | ORAL_TABLET | Freq: Every day | ORAL | 0 refills | Status: DC
  Filled 2020-04-29: qty 30, 30d supply, fill #0

## 2020-05-02 ENCOUNTER — Other Ambulatory Visit: Payer: Self-pay

## 2020-05-06 DIAGNOSIS — G4733 Obstructive sleep apnea (adult) (pediatric): Secondary | ICD-10-CM | POA: Diagnosis not present

## 2020-05-11 MED FILL — Tamsulosin HCl Cap 0.4 MG: ORAL | 30 days supply | Qty: 60 | Fill #0 | Status: AC

## 2020-05-11 MED FILL — Apixaban Tab 5 MG: ORAL | 30 days supply | Qty: 60 | Fill #0 | Status: AC

## 2020-05-12 ENCOUNTER — Other Ambulatory Visit: Payer: Self-pay

## 2020-05-23 ENCOUNTER — Ambulatory Visit: Payer: Medicare HMO | Attending: Internal Medicine | Admitting: Internal Medicine

## 2020-05-23 ENCOUNTER — Other Ambulatory Visit: Payer: Self-pay

## 2020-05-23 ENCOUNTER — Encounter: Payer: Self-pay | Admitting: Internal Medicine

## 2020-05-23 VITALS — BP 156/95 | HR 52 | Resp 16 | Wt 221.6 lb

## 2020-05-23 DIAGNOSIS — E663 Overweight: Secondary | ICD-10-CM | POA: Diagnosis not present

## 2020-05-23 DIAGNOSIS — I10 Essential (primary) hypertension: Secondary | ICD-10-CM

## 2020-05-23 DIAGNOSIS — R683 Clubbing of fingers: Secondary | ICD-10-CM | POA: Diagnosis not present

## 2020-05-23 DIAGNOSIS — Z23 Encounter for immunization: Secondary | ICD-10-CM

## 2020-05-23 DIAGNOSIS — I480 Paroxysmal atrial fibrillation: Secondary | ICD-10-CM

## 2020-05-23 DIAGNOSIS — G4733 Obstructive sleep apnea (adult) (pediatric): Secondary | ICD-10-CM

## 2020-05-23 DIAGNOSIS — Z9989 Dependence on other enabling machines and devices: Secondary | ICD-10-CM

## 2020-05-23 NOTE — Progress Notes (Signed)
Patient ID: [name]., male    DOB: 1956-01-02  MRN: [id number]  CC: Hypertension   Subjective: Albert Good is a 65 y.o. male who presents for chronic ds management His concerns today include:   Patient with history of PAF on Eliquis, HTN, obesity, BPH, CKD stage II, OSA, chronic gout of multiple joints, diverticulosis, tubular adenomatous polyps, + clubbing  OSA:  Did get CPAP and using it consistently.  Getting use to it. Noticed not as drowsy during the day and overall feels that he sleeps better.  HYPERTENSION/PAF Currently taking: see medication list.  He is on diltiazem, Avapro, metoprolol. Med Adherence: [x]  Yes - just took meds a few minutes before coming inside exam room Medication side effects: []  Yes    [x]  No Adherence with salt restriction: [x]  Yes    []  No Home Monitoring?: [x]  Yes but not recently.  He has some photos of blood pressure readings on his phone but these were from January.  Some of those readings were 134/86, 136/84, 123/77.  Blood pressure reading in March on cardiology visit was 130/76. Monitoring Frequency: []  Yes    []  No Home BP results range: []  Yes    []  No SOB? []  Yes    [x]  No Chest Pain?: []  Yes    [x]  No Leg swelling?: []  Yes    [x]  No Headaches?: []  Yes    [x]  No Dizziness? []  Yes    [x]  No Comments: no palpitations, spontaneous bruising or bleeding.  He is on Eliquis.  Obesity:  Loss 6 ls since 01/2020.  Not getting to gym as often now that he is back at work.  Does a lot of walking at work -he has cut pork from his diet.  Drinks water and lite beer.  Drinks a few cans of beer QOD  Clubbing of fingernails noted on last visit.  Quit smoking 15-16 yrs ago. Had smoked since late teens.  Use to smoke 1/2-2/3 pk/day. Now does snuff.    HM:  Due for COVID booster. Due for Tdap; given today.  Due for hep C screen, will have blood on 05/31/20 with his urologist and would prefer to have test for hepatitis C screening done at that time..   Patient Active Problem List   Diagnosis Date Noted  . Need for tetanus, diphtheria, and acellular pertussis (Tdap) vaccine 05/23/2020  . Overweight (BMI 25.0-29.9) 01/24/2020  . OSA (obstructive sleep apnea) 01/24/2020  . History of colon polyps 01/24/2020  . Chronic anticoagulation 03/28/2019  . Paroxysmal atrial fibrillation ([place]) 03/12/2018  . Essential hypertension 03/12/2018  . BPH associated with nocturia 03/12/2018  . Chronic gout of multiple sites 03/12/2018  . Long-term use of aspirin therapy 03/12/2018  . Chest pain 03/27/2017     Current Outpatient Medications on File Prior to Visit  Medication Sig Dispense Refill  . colchicine 0.6 MG tablet Take 1 tablet (0.6 mg total) by mouth daily. 30 tablet 0  . allopurinol (ZYLOPRIM) 300 MG tablet TAKE 1 TABLET (300 MG TOTAL) BY MOUTH DAILY. TO TREAT GOUT 90 tablet 1  . apixaban (ELIQUIS) 5 MG TABS tablet TAKE 1 TABLET (5 MG TOTAL) BY MOUTH 2 (TWO) TIMES DAILY. 60 tablet 11  . clomiPHENE (CLOMID) 50 MG tablet 1/2 TABLET BY MOUTH ONCE A DAY 30 tablet 5  . clomiPHENE (CLOMID) 50 MG tablet 1/2 tablet by mouth daily 15 tablet 11  . diltiazem (CARDIZEM CD) 300 MG 24 hr capsule TAKE 1 CAPSULE (300 MG  TOTAL) BY MOUTH DAILY. 90 capsule 3  . flecainide (TAMBOCOR) 50 MG tablet TAKE 1 TABLET (50 MG TOTAL) BY MOUTH DAILY. 90 tablet 3  . irbesartan (AVAPRO) 300 MG tablet TAKE 1 TABLET (300 MG TOTAL) BY MOUTH DAILY. 90 tablet 3  . metoprolol succinate (TOPROL-XL) 100 MG 24 hr tablet TAKE 1 TABLET (100 MG TOTAL) BY MOUTH DAILY. TAKE WITH OR IMMEDIATELY FOLLOWING A MEAL. 90 tablet 3  . tamsulosin (FLOMAX) 0.4 MG CAPS capsule TAKE 2 CAPSULES (0.8 MG TOTAL) BY MOUTH AT BEDTIME. 180 capsule 1   No current facility-administered medications on file prior to visit.    Allergies  Allergen Reactions  . Other Hives    HAIR DYE    Social History   Socioeconomic History  . Marital status: Unknown    Spouse name: Not on file  . Number of children:  Not on file  . Years of education: Not on file  . Highest education level: Not on file  Occupational History  . Not on file  Tobacco Use  . Smoking status: Former Research scientist (life sciences)  . Smokeless tobacco: Current User    Types: Chew  Vaping Use  . Vaping Use: Never used  Substance and Sexual Activity  . Alcohol use: Not Currently  . Drug use: Not Currently  . Sexual activity: Yes  Other Topics Concern  . Not on file  Social History Narrative  . Not on file   Social Determinants of Health   Financial Resource Strain: Not on file  Food Insecurity: Not on file  Transportation Needs: Not on file  Physical Activity: Not on file  Stress: Not on file  Social Connections: Not on file  Intimate Partner Violence: Not on file    Family History  Problem Relation Age of Onset  . Diabetes Mother   . Hypertension Father   . Diabetes Sister   . Diabetes Brother   . Heart failure Brother   . Kidney failure Brother     Past Surgical History:  Procedure Laterality Date  . LIPOSUCTION      ROS: Review of Systems Negative except as stated above  PHYSICAL EXAM: BP (!) 160/92   Pulse (!) 52   Resp 16   Wt 221 lb 9.6 oz (100.5 kg)   SpO2 97%   BMI 28.45 kg/m   Wt Readings from Last 3 Encounters:  05/23/20 221 lb 9.6 oz (100.5 kg)  03/12/20 224 lb 3.2 oz (101.7 kg)  01/24/20 227 lb 9.6 oz (103.2 kg)    Physical Exam  General appearance - alert, well appearing, older African-American male and in no distress Mental status - normal mood, behavior, speech, dress, motor activity, and thought processes Neck - supple, no significant adenopathy Chest - clear to auscultation, no wheezes, rales or rhonchi, symmetric air entry Heart - normal rate, regular rhythm, normal S1, S2, no murmurs, rubs, clicks or gallops Extremities - peripheral pulses normal, no pedal edema, no clubbing or cyanosis Fingernails again noted to have clubbing. CMP Latest Ref Rng & Units 03/12/2020 08/30/2019 03/21/2019   Glucose 65 - 99 mg/dL 79 86 85  BUN 8 - 27 mg/dL 14 10 9   Creatinine 0.76 - 1.27 mg/dL 1.25 1.26 1.15  Sodium 134 - 144 mmol/L 138 138 142  Potassium 3.5 - 5.2 mmol/L 4.2 4.0 4.1  Chloride 96 - 106 mmol/L 104 99 103  CO2 20 - 29 mmol/L 26 27 24   Calcium 8.6 - 10.2 mg/dL 9.1 9.1 9.6   Lipid  Panel     Component Value Date/Time   CHOL 161 08/30/2019 0943   TRIG 71 08/30/2019 0943   HDL 65 08/30/2019 0943   CHOLHDL 2.5 08/30/2019 0943   LDLCALC 82 08/30/2019 0943    CBC    Component Value Date/Time   WBC 5.7 03/12/2020 1533   RBC 4.87 03/12/2020 1533   HGB 14.1 03/12/2020 1533   HCT 40.6 03/12/2020 1533   PLT 199 03/12/2020 1533   MCV 83 03/12/2020 1533   MCH 29.0 03/12/2020 1533   MCHC 34.7 03/12/2020 1533   RDW 14.3 03/12/2020 1533   LYMPHSABS 2.9 09/28/2018 1000   EOSABS 0.2 09/28/2018 1000   BASOSABS 0.1 09/28/2018 1000    ASSESSMENT AND PLAN: 1. Essential hypertension Not at goal today but patient had just taken his medications.  Blood pressure on previous visit and in March with the cardiologist was at goal.  No changes made to medicines at this time.  He will see our clinical pharmacist in 1 month for Medicare wellness visit.  Blood pressure can be rechecked at that time.  Advised patient to check blood pressure at least twice a week and record the readings.  2. OSA on CPAP Doing well tolerating and benefiting from use of CPAP.  Reports significant decrease in daytime sleepiness.  Encouraged him to remain compliant with using the CPAP.  3. Overweight (BMI 25.0-29.9) Commended him on weight loss.  Encouraged him to continue trying to eat healthy and to move as much as he can.  4. Clubbing of nail Inform patient that this can sometimes be associated with underlying lung cancer or other chronic lung diseases.  He is outside of the window based on recommendations for lung cancer screening as he quit smoking 16 years ago.  However I have also encouraged him to quit  chewing snuff as it can cause oral cancers  5. PAF (paroxysmal atrial fibrillation) ([place]) Followed by cardiology.  Stable on medications.  Advised to report any bruising or bleeding on Eliquis  6. Need for tetanus, diphtheria, and acellular pertussis (Tdap) vaccine Given today     Patient was given the opportunity to ask questions.  Patient verbalized understanding of the plan and was able to repeat key elements of the plan.   Orders Placed This Encounter  Procedures  . Tdap vaccine greater than or equal to 7yo IM     Requested Prescriptions    No prescriptions requested or ordered in this encounter    No follow-ups on file.  Karle Plumber, MD, FACP

## 2020-05-23 NOTE — Patient Instructions (Addendum)
Your blood pressure was elevated today.  The goal is to be 130/80 or lower.  Please check your blood pressure at least twice a week and record the readings.  Bring those readings with you when you come in 1 month to meet with our clinical pharmacist for your Medicare wellness visit.  Since she will be having blood test drawn at the urologist later this month.  See if they will be willing to draw blood to screen for hepatitis C.  I encourage you to discontinue chewing tobacco.  Td (Tetanus, Diphtheria) Vaccine: What You Need to Know 1. Why get vaccinated? Td vaccine can prevent tetanus and diphtheria. Tetanus enters the body through cuts or wounds. Diphtheria spreads from person to person.  TETANUS (T) causes painful stiffening of the muscles. Tetanus can lead to serious health problems, including being unable to open the mouth, having trouble swallowing and breathing, or death.  DIPHTHERIA (D) can lead to difficulty breathing, heart failure, paralysis, or death. 2. Td vaccine Td is only for children 7 years and older, adolescents, and adults.  Td is usually given as a booster dose every 10 years, or after 5 years in the case of a severe or dirty wound or burn. Another vaccine, called "Tdap," may be used instead of Td. Tdap protects against pertussis, also known as "whooping cough," in addition to tetanus and diphtheria. Td may be given at the same time as other vaccines. 3. Talk with your health care provider Tell your vaccination provider if the person getting the vaccine:  Has had an allergic reaction after a previous dose of any vaccine that protects against tetanus or diphtheria, or has any severe, life-threatening allergies  Has ever had Guillain-Barr Syndrome (also called "GBS")  Has had severe pain or swelling after a previous dose of any vaccine that protects against tetanus or diphtheria In some cases, your health care provider may decide to postpone Td vaccination until a future  visit. People with minor illnesses, such as a cold, may be vaccinated. People who are moderately or severely ill should usually wait until they recover before getting Td vaccine.  Your health care provider can give you more information. 4. Risks of a vaccine reaction  Pain, redness, or swelling where the shot was given, mild fever, headache, feeling tired, and nausea, vomiting, diarrhea, or stomachache sometimes happen after Td vaccination. People sometimes faint after medical procedures, including vaccination. Tell your provider if you feel dizzy or have vision changes or ringing in the ears.  As with any medicine, there is a very remote chance of a vaccine causing a severe allergic reaction, other serious injury, or death. 5. What if there is a serious problem? An allergic reaction could occur after the vaccinated person leaves the clinic. If you see signs of a severe allergic reaction (hives, swelling of the face and throat, difficulty breathing, a fast heartbeat, dizziness, or weakness), call 9-1-1 and get the person to the nearest hospital.  For other signs that concern you, call your health care provider.  Adverse reactions should be reported to the Vaccine Adverse Event Reporting System (VAERS). Your health care provider will usually file this report, or you can do it yourself. Visit the VAERS website at www.vaers.SamedayNews.es or call (226) 777-1087. VAERS is only for reporting reactions, and VAERS staff members do not give medical advice. 6. The National Vaccine Injury Compensation Program The National Vaccine Injury Compensation Program (VICP) is a federal program that was created to compensate people who may have  been injured by certain vaccines. Claims regarding alleged injury or death due to vaccination have a time limit for filing, which may be as short as two years. Visit the VICP website at GoldCloset.com.ee or call 424-158-8348 to learn about the program and about filing a  claim. 7. How can I learn more?  Ask your health care provider.  Call your local or state health department.  Visit the website of the Food and Drug Administration (FDA) for vaccine package inserts and additional information at TraderRating.uy.  Contact the Centers for Disease Control and Prevention (CDC): ? Call 671 445 0744 (1-800-CDC-INFO) or ? Visit CDC's website at http://hunter.com/. Vaccine Information Statement Td (Tetanus, Diphtheria) Vaccine (08/17/2019) This information is not intended to replace advice given to you by your health care provider. Make sure you discuss any questions you have with your health care provider. Document Revised: 10/04/2019 Document Reviewed: 10/04/2019 Elsevier Patient Education  2021 [employer].

## 2020-05-26 ENCOUNTER — Other Ambulatory Visit: Payer: Self-pay | Admitting: Internal Medicine

## 2020-05-26 ENCOUNTER — Other Ambulatory Visit: Payer: Self-pay

## 2020-05-26 DIAGNOSIS — M1A09X0 Idiopathic chronic gout, multiple sites, without tophus (tophi): Secondary | ICD-10-CM

## 2020-05-26 MED ORDER — COLCHICINE 0.6 MG PO TABS
0.6000 mg | ORAL_TABLET | Freq: Every day | ORAL | 0 refills | Status: DC
  Filled 2020-05-26: qty 30, 30d supply, fill #0

## 2020-05-26 MED FILL — Metoprolol Succinate Tab ER 24HR 100 MG (Tartrate Equiv): ORAL | 30 days supply | Qty: 30 | Fill #0 | Status: AC

## 2020-05-27 ENCOUNTER — Other Ambulatory Visit: Payer: Self-pay

## 2020-06-05 DIAGNOSIS — G4733 Obstructive sleep apnea (adult) (pediatric): Secondary | ICD-10-CM | POA: Diagnosis not present

## 2020-06-12 ENCOUNTER — Other Ambulatory Visit: Payer: Self-pay

## 2020-06-12 MED FILL — Allopurinol Tab 300 MG: ORAL | 30 days supply | Qty: 30 | Fill #0 | Status: AC

## 2020-06-12 MED FILL — Irbesartan Tab 300 MG: ORAL | 30 days supply | Qty: 30 | Fill #0 | Status: AC

## 2020-06-12 MED FILL — Tamsulosin HCl Cap 0.4 MG: ORAL | 30 days supply | Qty: 60 | Fill #1 | Status: AC

## 2020-06-12 MED FILL — Apixaban Tab 5 MG: ORAL | 30 days supply | Qty: 60 | Fill #1 | Status: AC

## 2020-06-13 ENCOUNTER — Other Ambulatory Visit: Payer: Self-pay

## 2020-06-17 NOTE — Progress Notes (Signed)
Subjective:    Albert Good. is a 65 y.o. male who presents for a welcome to Medicare exam.  No hospitalization within the last 12 months.  Care team: Dr.Deborah Wynetta Emery , MD  Cardiologist: Dr. Fransico Him.MD [facility]: Provider unknown   Cardiac risk factors: advanced age (older than 66 for men, 2 for women), hypertension and Atrial fibrillation .  Family History  Problem Relation Age of Onset  . Diabetes Mother   . Hypertension Father   . Diabetes Sister   . Diabetes Brother   . Heart failure Brother   . Kidney failure Brother    Social Determinants of Health with Concerns   Tobacco Use: High Risk  . Smoking Tobacco Use: Former Smoker  . Smokeless Tobacco Use: Current Database administrator Strain: None   Food Insecurity: None   Transportation Needs: None   Physical Activity: None   Stress: None   Social Connections: Not on file  Intimate Partner Violence: Not on file  Alcohol Screen: 1-2 beers most days of week   Housing: none     Past Medical History:  Diagnosis Date  . BPH associated with nocturia 03/12/2018  . BPH with obstruction/lower urinary tract symptoms   . Chronic gout of multiple sites 03/12/2018  . Essential hypertension   . Gout   . Long-term use of aspirin therapy 03/12/2018  . OSA (obstructive sleep apnea)   . Paroxysmal atrial fibrillation (HCC)      Depression Screen (Note: if answer to either of the following is "Yes", a more complete depression screening is indicated)  Q1: Over the past two weeks, have you felt down, depressed or hopeless? no Q2: Over the past two weeks, have you felt little interest or pleasure in doing things? no  Activities of Daily Living In your present state of health, do you have any difficulty performing the following activities?:  Preparing food and eating?: No Bathing yourself: No Getting dressed: No Using the toilet:No Moving around from place to place: No In the past year have you fallen or had  a near fall?:No  Current exercise habits: Home exercise routine includes walking weight lifting  hrs per 5-7 days per week and approx 1 hour.  Dietary issues discussed: Yes, encouraged to continue reduce Sodium  Hearing difficulties: No Safe in current home environment: yes Timed Get up Go: Passed       The following portions of the patient's history were reviewed and updated as appropriate: allergies, current medications, past family history, past medical history, past social history, past surgical history and problem list.   Review of Systems Pertinent items are noted in HPI.    Objective:      Blood pressure 122/78, height 6\' 2"  (1.88 m), weight 227 lb (103 kg). Body mass index is 29.15 kg/m. General appearance: cooperative and appears stated age Head: Normocephalic, without obvious abnormality, atraumatic   Respiratory: Respirations even and unlabored, normal respiratory rate Heart: rate and rhythm normal. No gallop or murmurs noted on exam  Extremities: No gross deformities Skin: Skin color, texture, turgor normal. No rashes seen  Psych: Appropriate mood and affect. Neurologic: GCS 15, normal coordination, normal gait  Mental status: Normal Affect, normal mood, normal judgment   Assessment:    -Patient in overall well, stable course of health.  - Compliant with medication regimen  -Engages in a regular exercise   -Overdue for Hep C., uncertain about Shingles and COVID Booster  - Moderate alcohol intake-which is patient baseline and  he denies is a chronic problem  -Tobacco Chew/dip, not contemplating quitting at present          The primary encounter diagnosis was Medicare annual wellness visit, initial. Diagnoses of Paroxysmal atrial fibrillation ([place]), Primary hypertension, Tobacco chew use, and OSA on CPAP were also pertinent to this visit.  Continue follow-up with Cardiology for management of Atrial Fibrillation. Follow-up with PCP and obtain Hep C screening  and discuss risks vs benefits of Shingles and COVID booster.  During the course of the visit the patient was educated and counseled about appropriate screening and preventive services including:   Pneumococcal vaccine   Monitor for oral lesion given 6 year history of oral tobacco use, risk for oral and esophageal cancers   Patient Instructions (the written plan) was given to the patient.     Carroll Sage. Kenton Kingfisher, MSN, [facility] [place], [place], [place] [address] [phone]

## 2020-06-18 ENCOUNTER — Encounter: Payer: Self-pay | Admitting: Cardiology

## 2020-06-18 ENCOUNTER — Other Ambulatory Visit: Payer: Self-pay

## 2020-06-18 ENCOUNTER — Ambulatory Visit: Payer: Medicare HMO | Attending: Family Medicine | Admitting: Family Medicine

## 2020-06-18 ENCOUNTER — Ambulatory Visit: Payer: Medicare HMO | Admitting: Cardiology

## 2020-06-18 ENCOUNTER — Encounter: Payer: Self-pay | Admitting: Family Medicine

## 2020-06-18 VITALS — BP 122/78 | Ht 74.0 in | Wt 227.0 lb

## 2020-06-18 VITALS — BP 112/66 | HR 53 | Ht 74.0 in | Wt 228.4 lb

## 2020-06-18 DIAGNOSIS — Z0001 Encounter for general adult medical examination with abnormal findings: Secondary | ICD-10-CM

## 2020-06-18 DIAGNOSIS — G4733 Obstructive sleep apnea (adult) (pediatric): Secondary | ICD-10-CM

## 2020-06-18 DIAGNOSIS — F17220 Nicotine dependence, chewing tobacco, uncomplicated: Secondary | ICD-10-CM | POA: Diagnosis not present

## 2020-06-18 DIAGNOSIS — Z0000 Encounter for general adult medical examination without abnormal findings: Secondary | ICD-10-CM

## 2020-06-18 DIAGNOSIS — Z9989 Dependence on other enabling machines and devices: Secondary | ICD-10-CM

## 2020-06-18 DIAGNOSIS — I10 Essential (primary) hypertension: Secondary | ICD-10-CM

## 2020-06-18 DIAGNOSIS — I480 Paroxysmal atrial fibrillation: Secondary | ICD-10-CM

## 2020-06-18 DIAGNOSIS — Z720 Tobacco use: Secondary | ICD-10-CM

## 2020-06-18 NOTE — Patient Instructions (Signed)
Medication Instructions:  Your physician recommends that you continue on your current medications as directed. Please refer to the Current Medication list given to you today.  *If you need a refill on your cardiac medications before your next appointment, please call your pharmacy*  Follow-Up: At [facility], you and your health needs are our priority.  As part of our continuing mission to provide you with exceptional heart care, we have created designated Provider Care Teams.  These Care Teams include your primary Cardiologist (physician) and Advanced Practice Providers (APPs -  Physician Assistants and Nurse Practitioners) who all work together to provide you with the care you need, when you need it.  We recommend signing up for the patient portal called "MyChart".  Sign up information is provided on this After Visit Summary.  MyChart is used to connect with patients for Virtual Visits (Telemedicine).  Patients are able to view lab/test results, encounter notes, upcoming appointments, etc.  Non-urgent messages can be sent to your provider as well.   To learn more about what you can do with MyChart, go to [email or web address].    Your next appointment:   1 year(s)  The format for your next appointment:   In Person  Provider:   Traci Turner, MD   

## 2020-06-18 NOTE — Progress Notes (Signed)
Cardiology Office Note:    Date:  06/18/2020   ID:  Albert Bon., DOB 08/16/1955, MRN [id number]  PCP:  Ladell Pier, MD  Cardiologist:  Dorris Carnes, MD    Referring MD: Ladell Pier, MD   Chief Complaint  Patient presents with  . Sleep Apnea  . Hypertension  . Atrial Fibrillation    History of Present Illness:    Albert Brodhead. is a 65 y.o. male with a hx of PAF, HTN and OSA.  He was referred for sleep study due to excessive daytime sleepiness and snoring and was found to have severe OSA with an AHI of 31.8/hr and nocturnal hypoxemia with O2 sats as low as 72%.  He underwent CPAP titration to 12cm H2O.  He is now here for followup.   He is doing well with hisd CPAP device and thinks that he has gotten used to it.  He tolerates the Full face mask and feels the pressure is adequate.  Since going on CPAP he feels rested in the am and has no significant daytime sleepiness.  He denies any significant mouth or nasal dryness or nasal congestion.  He does not think that he snores.     Past Medical History:  Diagnosis Date  . BPH associated with nocturia 03/12/2018  . BPH with obstruction/lower urinary tract symptoms   . Chronic gout of multiple sites 03/12/2018  . Essential hypertension   . Gout   . Long-term use of aspirin therapy 03/12/2018  . OSA (obstructive sleep apnea)   . Paroxysmal atrial fibrillation [facility])     Past Surgical History:  Procedure Laterality Date  . LIPOSUCTION      Current Medications: Current Meds  Medication Sig  . allopurinol (ZYLOPRIM) 300 MG tablet TAKE 1 TABLET (300 MG TOTAL) BY MOUTH DAILY. TO TREAT GOUT  . apixaban (ELIQUIS) 5 MG TABS tablet TAKE 1 TABLET (5 MG TOTAL) BY MOUTH 2 (TWO) TIMES DAILY.  Marland Kitchen colchicine 0.6 MG tablet Take 1 tablet (0.6 mg total) by mouth daily.  Marland Kitchen diltiazem (CARDIZEM CD) 300 MG 24 hr capsule TAKE 1 CAPSULE (300 MG TOTAL) BY MOUTH DAILY.  . flecainide (TAMBOCOR) 50 MG tablet TAKE 1 TABLET (50 MG TOTAL) BY  MOUTH DAILY.  Marland Kitchen irbesartan (AVAPRO) 300 MG tablet TAKE 1 TABLET (300 MG TOTAL) BY MOUTH DAILY.  . metoprolol succinate (TOPROL-XL) 100 MG 24 hr tablet TAKE 1 TABLET (100 MG TOTAL) BY MOUTH DAILY. TAKE WITH OR IMMEDIATELY FOLLOWING A MEAL.  . tamsulosin (FLOMAX) 0.4 MG CAPS capsule TAKE 2 CAPSULES (0.8 MG TOTAL) BY MOUTH AT BEDTIME.     Allergies:   Other   Social History   Socioeconomic History  . Marital status: Unknown    Spouse name: Not on file  . Number of children: Not on file  . Years of education: Not on file  . Highest education level: Not on file  Occupational History  . Not on file  Tobacco Use  . Smoking status: Former Research scientist (life sciences)  . Smokeless tobacco: Current User    Types: Snuff  Vaping Use  . Vaping Use: Never used  Substance and Sexual Activity  . Alcohol use: Not Currently  . Drug use: Not Currently  . Sexual activity: Yes  Other Topics Concern  . Not on file  Social History Narrative  . Not on file   Social Determinants of Health   Financial Resource Strain: Not on file  Food Insecurity: Not on file  Transportation Needs:  Not on file  Physical Activity: Not on file  Stress: Not on file  Social Connections: Not on file     Family History: The patient's family history includes Diabetes in his brother, mother, and sister; Heart failure in his brother; Hypertension in his father; Kidney failure in his brother.  ROS:   Please see the history of present illness.    ROS  All other systems reviewed and negative.   EKGs/Labs/Other Studies Reviewed:    The following studies were reviewed today: PSG, PAP titration and PAP download  EKG:  EKG is not ordered today.    Recent Labs: 08/30/2019: TSH 2.150 03/12/2020: BUN 14; Creatinine, Ser 1.25; Hemoglobin 14.1; Platelets 199; Potassium 4.2; Sodium 138   Recent Lipid Panel    Component Value Date/Time   CHOL 161 08/30/2019 0943   TRIG 71 08/30/2019 0943   HDL 65 08/30/2019 0943   CHOLHDL 2.5 08/30/2019  0943   LDLCALC 82 08/30/2019 0943    Physical Exam:    VS:  BP 112/66   Pulse (!) 53   Ht 6\' 2"  (1.88 m)   Wt 228 lb 6.4 oz (103.6 kg)   SpO2 98%   BMI 29.32 kg/m     Wt Readings from Last 3 Encounters:  06/18/20 228 lb 6.4 oz (103.6 kg)  05/23/20 221 lb 9.6 oz (100.5 kg)  03/12/20 224 lb 3.2 oz (101.7 kg)     GEN:  Well nourished, well developed in no acute distress HEENT: Normal NECK: No JVD; No carotid bruits LYMPHATICS: No lymphadenopathy CARDIAC: RRR, no murmurs, rubs, gallops RESPIRATORY:  Clear to auscultation without rales, wheezing or rhonchi  ABDOMEN: Soft, non-tender, non-distended MUSCULOSKELETAL:  No edema; No deformity  SKIN: Warm and dry NEUROLOGIC:  Alert and oriented x 3 PSYCHIATRIC:  Normal affect   ASSESSMENT:    1. OSA (obstructive sleep apnea)   2. Essential hypertension   3. Paroxysmal atrial fibrillation (HCC)    PLAN:    In order of problems listed above: 1.  OSA - The patient is tolerating PAP therapy well without any problems. The PAP download performed by his DME was personally reviewed and interpreted by me today and showed an AHI of 0.5/hr on 12cm H2O with 26.7% compliance in using more than 4 hours nightly.  The patient has been using and benefiting from PAP use and will continue to benefit from therapy.  -he missed a few days because of work and did not make it home to use it>>sometimes he has to sleep in his truck and then cannot use it -unfortunately he has to work some and when he has to sleep in the truck he has no way to plug it in but when home he uses it  2.  HTN -BP is adequately controlled on exam today -Continue prescription drug management with Toprol XL 100mg  daily, Cardizem CD 300mg  daily and Irbesartan 300mg  daily with PRN refills  3.  PAF -he is maintaining NSR on exam today -he denies any bleeding problems on his DOAC -occasionally he will have come brief palpitations but nothing sustained -Continue prescription drug  management with Eliquis 5mg  BID, Cardizem 300mg  daily, Toprol XL 100mg  daily and Flecainide 50mg  BID with PRN refills   Time Spent: 20 minutes total time of encounter, including 10 minutes spent in face-to-face patient care on the date of this encounter. This time includes coordination of care and counseling regarding above mentioned problem list. Remainder of non-face-to-face time involved reviewing chart documents/testing relevant  to the patient encounter and documentation in the medical record. I have independently reviewed documentation from referring provider  Medication Adjustments/Labs and Tests Ordered: Current medicines are reviewed at length with the patient today.  Concerns regarding medicines are outlined above.  No orders of the defined types were placed in this encounter.  No orders of the defined types were placed in this encounter.   Signed, Fransico Him, MD  06/18/2020 11:25 AM    [place]

## 2020-06-18 NOTE — Patient Instructions (Signed)
    Mr. Codispoti , Thank you for taking time to come for your Medicare Wellness Visit. I appreciate your ongoing commitment to your health goals. Please review the following plan we discussed and let me know if I can assist you in the future.   These are the goals we discussed: -Continue with daily exercise -Consider Tobacco cessation -Discuss with Health Care Provider updating vaccines. Blood Pressure looks great today! This is a list of the screening recommended for you and due dates:  Health Maintenance  Topic Date Due  . Pneumococcal Vaccination (1 of 4 - PCV13) Never done  . Hepatitis C Screening: USPSTF Recommendation to screen - Ages 8-79 yo.  Never done  . Zoster (Shingles) Vaccine (1 of 2) Never done  . COVID-19 Vaccine (3 - Booster for Pfizer series) 09/23/2019  . Flu Shot  08/11/2020  . Pneumonia vaccines (2 of 2 - PPSV23) 01/23/2021  . Colon Cancer Screening  09/02/2021  . Tetanus Vaccine  05/24/2030  . HPV Vaccine  Aged Out

## 2020-06-23 ENCOUNTER — Ambulatory Visit: Payer: Medicare HMO | Admitting: Pharmacist

## 2020-06-24 ENCOUNTER — Other Ambulatory Visit: Payer: Self-pay

## 2020-06-24 ENCOUNTER — Other Ambulatory Visit: Payer: Self-pay | Admitting: Internal Medicine

## 2020-06-24 DIAGNOSIS — M1A09X0 Idiopathic chronic gout, multiple sites, without tophus (tophi): Secondary | ICD-10-CM

## 2020-06-24 MED FILL — Metoprolol Succinate Tab ER 24HR 100 MG (Tartrate Equiv): ORAL | 30 days supply | Qty: 30 | Fill #1 | Status: AC

## 2020-06-24 NOTE — Telephone Encounter (Signed)
Requested medication (s) are due for refill today: yes   Requested medication (s) are on the active medication list: yes   Last refill: 05/27/2020  Future visit scheduled: no  Notes to clinic: Failed protocol: Uric Acid in normal range and within 360 days   Requested Prescriptions  Pending Prescriptions Disp Refills   colchicine 0.6 MG tablet 30 tablet 0    Sig: Take 1 tablet (0.6 mg total) by mouth daily.      Endocrinology:  Gout Agents Failed - 06/24/2020  7:28 AM      Failed - Uric Acid in normal range and within 360 days    Uric Acid  Date Value Ref Range Status  09/28/2018 3.4 (L) 3.7 - 8.6 mg/dL Final    Comment:               Therapeutic target for gout patients: <6.0          Passed - Cr in normal range and within 360 days    Creatinine, Ser  Date Value Ref Range Status  03/12/2020 1.25 0.76 - 1.27 mg/dL Final          Passed - Valid encounter within last 12 months    Recent Outpatient Visits           6 days ago Medicare annual wellness visit, initial   [place], FNP   1 month ago Essential hypertension   [place], MD   4 months ago Essential hypertension   [place], Stephen L, RPH-CPP   5 months ago Need for influenza vaccination   [place], Jarome Matin, RPH-CPP   5 months ago Essential hypertension   [place], MD       Future Appointments             In 3 months Wynetta Emery, Dalbert Batman, MD [place]

## 2020-06-25 ENCOUNTER — Other Ambulatory Visit: Payer: Self-pay

## 2020-06-25 MED ORDER — COLCHICINE 0.6 MG PO TABS
0.6000 mg | ORAL_TABLET | Freq: Every day | ORAL | 0 refills | Status: DC
  Filled 2020-06-25: qty 30, 30d supply, fill #0

## 2020-07-03 ENCOUNTER — Ambulatory Visit: Payer: Medicare HMO | Attending: Internal Medicine | Admitting: Pharmacist

## 2020-07-03 ENCOUNTER — Other Ambulatory Visit: Payer: Self-pay

## 2020-07-03 ENCOUNTER — Encounter: Payer: Self-pay | Admitting: Pharmacist

## 2020-07-03 VITALS — BP 153/93

## 2020-07-03 DIAGNOSIS — I10 Essential (primary) hypertension: Secondary | ICD-10-CM | POA: Diagnosis not present

## 2020-07-03 NOTE — Progress Notes (Signed)
   S:    PCP: Dr. Wynetta Emery   Patient arrives in good spirits. Presents to the clinic for hypertension evaluation, counseling, and management. Patient was referred and last seen by Primary Care Provider on 05/23/2020. BP at that visit was 156/95, however, pt took medications just prior to being seen that day. Since that visit, he has been seen for his AWV and by his Cardiologist. BP was at goal at both of those visits.   Today, pt reports doing well. Denies chest pains, dyspnea, HA or swelling. Denies any medication side effects.   Medication adherence reported. Denies any missed doses.  Current BP Medications include:  diltiazem 300 mg daily, irbesartan 300 mg daily, Toprol-XL 100 mg daily   Dietary habits include: cut out pork; does endorse using seasoning salt/adding salt to food Exercise habits include: none  Family / Social history:  -FHX: DM, HTN HF, kidney disease  -Tobacco:   O:  Vitals:   07/03/20 1006  BP: (!) 153/93   Home BP readings: none  Last 3 Office BP readings: BP Readings from Last 3 Encounters:  07/03/20 (!) 153/93  06/18/20 112/66  06/18/20 122/78    BMET    Component Value Date/Time   NA 138 03/12/2020 1533   K 4.2 03/12/2020 1533   CL 104 03/12/2020 1533   CO2 26 03/12/2020 1533   GLUCOSE 79 03/12/2020 1533   BUN 14 03/12/2020 1533   CREATININE 1.25 03/12/2020 1533   CALCIUM 9.1 03/12/2020 1533   GFRNONAA 59 (L) 08/30/2019 0943   GFRAA 69 08/30/2019 0943    Renal function: CrCl cannot be calculated (Patient's most recent lab result is older than the maximum 21 days allowed.).  Clinical ASCVD: No  The ASCVD Risk score Mikey Bussing DC Jr., et al., 2013) failed to calculate for the following reasons:   Unable to determine if patient is Non-Hispanic African American    A/P: Hypertension longstanding currently above goal on current medications. BP Goal = < 130/80 mmHg. Medication adherence reported. He had BP at two visits earlier this month that was  at goal. [facility] hold off on changes. Advised pt to try and check BP at home and bring these in.  - No changes today.    -Counseled on lifestyle modifications for blood pressure control including reduced dietary sodium, increased exercise, adequate sleep.  Results reviewed and written information provided.   Total time in face-to-face counseling 30 minutes.   F/U Clinic Visit in 1 month.    Benard Halsted, PharmD, [place], [place] [phone]

## 2020-07-06 DIAGNOSIS — G4733 Obstructive sleep apnea (adult) (pediatric): Secondary | ICD-10-CM | POA: Diagnosis not present

## 2020-07-09 ENCOUNTER — Encounter: Payer: Self-pay | Admitting: Internal Medicine

## 2020-07-09 ENCOUNTER — Other Ambulatory Visit: Payer: Self-pay

## 2020-07-09 MED FILL — Flecainide Acetate Tab 50 MG: ORAL | 90 days supply | Qty: 90 | Fill #0 | Status: AC

## 2020-07-09 MED FILL — Diltiazem HCl Coated Beads Cap ER 24HR 300 MG: ORAL | 90 days supply | Qty: 90 | Fill #0 | Status: AC

## 2020-07-09 MED FILL — Irbesartan Tab 300 MG: ORAL | 30 days supply | Qty: 30 | Fill #1 | Status: AC

## 2020-07-09 MED FILL — Apixaban Tab 5 MG: ORAL | 30 days supply | Qty: 60 | Fill #2 | Status: AC

## 2020-07-09 MED FILL — Allopurinol Tab 300 MG: ORAL | 30 days supply | Qty: 30 | Fill #1 | Status: AC

## 2020-07-09 NOTE — Progress Notes (Signed)
I received note from [facility] Dr. Harold Barban.  Patient seen by him on 07/07/2020.  Patient with diagnosis of BPH, elevated PSA, weak urinary stream, low testosterone.  Patient started on Clomid 25 mg daily.  Scheduled for follow-up in 3 months.

## 2020-07-17 ENCOUNTER — Other Ambulatory Visit: Payer: Self-pay | Admitting: Internal Medicine

## 2020-07-17 DIAGNOSIS — M1A09X0 Idiopathic chronic gout, multiple sites, without tophus (tophi): Secondary | ICD-10-CM

## 2020-07-17 MED ORDER — COLCHICINE 0.6 MG PO TABS
0.6000 mg | ORAL_TABLET | Freq: Every day | ORAL | 0 refills | Status: DC
  Filled 2020-07-17: qty 30, 30d supply, fill #0

## 2020-07-17 MED FILL — Tamsulosin HCl Cap 0.4 MG: ORAL | 30 days supply | Qty: 60 | Fill #2 | Status: AC

## 2020-07-18 ENCOUNTER — Other Ambulatory Visit: Payer: Self-pay

## 2020-07-21 ENCOUNTER — Other Ambulatory Visit: Payer: Self-pay | Admitting: Internal Medicine

## 2020-07-21 ENCOUNTER — Other Ambulatory Visit: Payer: Self-pay

## 2020-07-21 DIAGNOSIS — R351 Nocturia: Secondary | ICD-10-CM

## 2020-07-21 DIAGNOSIS — N401 Enlarged prostate with lower urinary tract symptoms: Secondary | ICD-10-CM

## 2020-07-21 MED ORDER — TAMSULOSIN HCL 0.4 MG PO CAPS
ORAL_CAPSULE | ORAL | 1 refills | Status: DC
  Filled 2020-07-21: qty 180, fill #0
  Filled 2020-08-13: qty 180, 90d supply, fill #0
  Filled 2020-11-08: qty 180, 90d supply, fill #1

## 2020-07-26 MED FILL — Metoprolol Succinate Tab ER 24HR 100 MG (Tartrate Equiv): ORAL | 30 days supply | Qty: 30 | Fill #2 | Status: AC

## 2020-07-28 ENCOUNTER — Other Ambulatory Visit: Payer: Self-pay

## 2020-07-29 ENCOUNTER — Other Ambulatory Visit: Payer: Self-pay

## 2020-08-01 ENCOUNTER — Telehealth: Payer: Self-pay | Admitting: Nurse Practitioner

## 2020-08-01 NOTE — Telephone Encounter (Signed)
Called to confirm pt appt on Monday pt confirmed they are coming and we confirmed the office address where they should arrive

## 2020-08-04 ENCOUNTER — Ambulatory Visit: Payer: Medicare HMO | Attending: Internal Medicine | Admitting: Pharmacist

## 2020-08-04 ENCOUNTER — Ambulatory Visit (INDEPENDENT_AMBULATORY_CARE_PROVIDER_SITE_OTHER): Payer: Medicare HMO | Admitting: Nurse Practitioner

## 2020-08-04 ENCOUNTER — Encounter: Payer: Self-pay | Admitting: Pharmacist

## 2020-08-04 ENCOUNTER — Other Ambulatory Visit: Payer: Self-pay

## 2020-08-04 VITALS — BP 116/71 | HR 49

## 2020-08-04 VITALS — BP 152/85 | HR 61 | Temp 97.9°F | Resp 18 | Ht 74.0 in | Wt 222.0 lb

## 2020-08-04 DIAGNOSIS — Z0000 Encounter for general adult medical examination without abnormal findings: Secondary | ICD-10-CM | POA: Diagnosis not present

## 2020-08-04 DIAGNOSIS — I10 Essential (primary) hypertension: Secondary | ICD-10-CM | POA: Diagnosis not present

## 2020-08-04 DIAGNOSIS — Z87891 Personal history of nicotine dependence: Secondary | ICD-10-CM | POA: Diagnosis not present

## 2020-08-04 NOTE — Patient Instructions (Addendum)
Albert Good , Thank you for taking time to come for your Medicare Wellness Visit. I appreciate your ongoing commitment to your health goals. Please review the following plan we discussed and let me know if I can assist you in the future.   These are the goals we discussed:  Goals     . Have 3 meals a day        This is a list of the screening recommended for you and due dates:  Health Maintenance  Topic Date Due  . Hepatitis C Screening: USPSTF Recommendation to screen - Ages 48-79 yo.  Never done  . Zoster (Shingles) Vaccine (1 of 2) Never done  . COVID-19 Vaccine (3 - Pfizer risk series) 05/21/2019  . Flu Shot  08/11/2020  . Pneumonia vaccines (2 of 2 - PPSV23) 01/23/2021  . Colon Cancer Screening  09/02/2021  . Tetanus Vaccine  05/24/2030  . HPV Vaccine  Aged Out    Fall Prevention in the Home, Adult Falls can cause injuries and can happen to people of all ages. There are many things you can do to make your home safe and to help prevent falls. Ask forhelp when making these changes. What actions can I take to prevent falls? General Instructions Use good lighting in all rooms. Replace any light bulbs that burn out. Turn on the lights in dark areas. Use night-lights. Keep items that you use often in easy-to-reach places. Lower the shelves around your home if needed. Set up your furniture so you have a clear path. Avoid moving your furniture around. Do not have throw rugs or other things on the floor that can make you trip. Avoid walking on wet floors. If any of your floors are uneven, fix them. Add color or contrast paint or tape to clearly mark and help you see: Grab bars or handrails. First and last steps of staircases. Where the edge of each step is. If you use a stepladder: Make sure that it is fully opened. Do not climb a closed stepladder. Make sure the sides of the stepladder are locked in place. Ask someone to hold the stepladder while you use it. Know where your  pets are when moving through your home. What can I do in the bathroom?     Keep the floor dry. Clean up any water on the floor right away. Remove soap buildup in the tub or shower. Use nonskid mats or decals on the floor of the tub or shower. Attach bath mats securely with double-sided, nonslip rug tape. If you need to sit down in the shower, use a plastic, nonslip stool. Install grab bars by the toilet and in the tub and shower. Do not use towel bars as grab bars. What can I do in the bedroom? Make sure that you have a light by your bed that is easy to reach. Do not use any sheets or blankets for your bed that hang to the floor. Have a firm chair with side arms that you can use for support when you get dressed. What can I do in the kitchen? Clean up any spills right away. If you need to reach something above you, use a step stool with a grab bar. Keep electrical cords out of the way. Do not use floor polish or wax that makes floors slippery. What can I do with my stairs? Do not leave any items on the stairs. Make sure that you have a light switch at the top and the bottom of  the stairs. Make sure that there are handrails on both sides of the stairs. Fix handrails that are broken or loose. Install nonslip stair treads on all your stairs. Avoid having throw rugs at the top or bottom of the stairs. Choose a carpet that does not hide the edge of the steps on the stairs. Check carpeting to make sure that it is firmly attached to the stairs. Fix carpet that is loose or worn. What can I do on the outside of my home? Use bright outdoor lighting. Fix the edges of walkways and driveways and fix any cracks. Remove anything that might make you trip as you walk through a door, such as a raised step or threshold. Trim any bushes or trees on paths to your home. Check to see if handrails are loose or broken and that both sides of all steps have handrails. Install guardrails along the edges of any  raised decks and porches. Clear paths of anything that can make you trip, such as tools or rocks. Have leaves, snow, or ice cleared regularly. Use sand or salt on paths during winter. Clean up any spills in your garage right away. This includes grease or oil spills. What other actions can I take? Wear shoes that: Have a low heel. Do not wear high heels. Have rubber bottoms. Feel good on your feet and fit well. Are closed at the toe. Do not wear open-toe sandals. Use tools that help you move around if needed. These include: Canes. Walkers. Scooters. Crutches. Review your medicines with your doctor. Some medicines can make you feel dizzy. This can increase your chance of falling. Ask your doctor what else you can do to help prevent falls. Where to find more information Centers for Disease Control and Prevention, [employer]: http://www.wolf.info/ National Institute on Aging: http://kim-miller.com/ Contact a doctor if: You are afraid of falling at home. You feel weak, drowsy, or dizzy at home. You fall at home. Summary There are many simple things that you can do to make your home safe and to help prevent falls. Ways to make your home safe include removing things that can make you trip and installing grab bars in the bathroom. Ask for help when making these changes in your home. This information is not intended to replace advice given to you by your health care provider. Make sure you discuss any questions you have with your healthcare provider. Document Revised: 08/01/2019 Document Reviewed: 08/01/2019 Elsevier Patient Education  [place] Maintenance, Male Adopting a healthy lifestyle and getting preventive care are important in promoting health and wellness. Ask your health care provider about: The right schedule for you to have regular tests and exams. Things you can do on your own to prevent diseases and keep yourself healthy. What should I know about diet, weight, and  exercise? Eat a healthy diet  Eat a diet that includes plenty of vegetables, fruits, low-fat dairy products, and lean protein. Do not eat a lot of foods that are high in solid fats, added sugars, or sodium.  Maintain a healthy weight Body mass index (BMI) is a measurement that can be used to identify possible weight problems. It estimates body fat based on height and weight. Your health care provider can help determine your BMI and help you achieve or maintain ahealthy weight. Get regular exercise Get regular exercise. This is one of the most important things you can do for your health. Most adults should: Exercise for at least 150 minutes each week. The  exercise should increase your heart rate and make you sweat (moderate-intensity exercise). Do strengthening exercises at least twice a week. This is in addition to the moderate-intensity exercise. Spend less time sitting. Even light physical activity can be beneficial. Watch cholesterol and blood lipids Have your blood tested for lipids and cholesterol at 67 years of age, then havethis test every 5 years. You may need to have your cholesterol levels checked more often if: Your lipid or cholesterol levels are high. You are older than 67 years of age. You are at high risk for heart disease. What should I know about cancer screening? Many types of cancers can be detected early and may often be prevented. Depending on your health history and family history, you may need to have cancer screening at various ages. This may include screening for: Colorectal cancer. Prostate cancer. Skin cancer. Lung cancer. What should I know about heart disease, diabetes, and high blood pressure? Blood pressure and heart disease High blood pressure causes heart disease and increases the risk of stroke. This is more likely to develop in people who have high blood pressure readings, are of African descent, or are overweight. Talk with your health care provider  about your target blood pressure readings. Have your blood pressure checked: Every 3-5 years if you are 59-35 years of age. Every year if you are 52 years old or older. If you are between the ages of 55 and 38 and are a current or former smoker, ask your health care provider if you should have a one-time screening for abdominal aortic aneurysm (AAA). Diabetes Have regular diabetes screenings. This checks your fasting blood sugar level. Have the screening done: Once every three years after age 3 if you are at a normal weight and have a low risk for diabetes. More often and at a younger age if you are overweight or have a high risk for diabetes. What should I know about preventing infection? Hepatitis B If you have a higher risk for hepatitis B, you should be screened for this virus. Talk with your health care provider to find out if you are at risk forhepatitis B infection. Hepatitis C Blood testing is recommended for: Everyone born from [birth year] through 1967. Anyone with known risk factors for hepatitis C. Sexually transmitted infections (STIs) You should be screened each year for STIs, including gonorrhea and chlamydia, if: You are sexually active and are younger than 67 years of age. You are older than 67 years of age and your health care provider tells you that you are at risk for this type of infection. Your sexual activity has changed since you were last screened, and you are at increased risk for chlamydia or gonorrhea. Ask your health care provider if you are at risk. Ask your health care provider about whether you are at high risk for HIV. Your health care provider may recommend a prescription medicine to help prevent HIV infection. If you choose to take medicine to prevent HIV, you should first get tested for HIV. You should then be tested every 3 months for as long as you are taking the medicine. Follow these instructions at home: Lifestyle Do not use any products that contain nicotine  or tobacco, such as cigarettes, e-cigarettes, and chewing tobacco. If you need help quitting, ask your health care provider. Do not use street drugs. Do not share needles. Ask your health care provider for help if you need support or information about quitting drugs. Alcohol use Do not drink alcohol if  your health care provider tells you not to drink. If you drink alcohol: Limit how much you have to 0-2 drinks a day. Be aware of how much alcohol is in your drink. In the [place] one drink equals one 12 oz bottle of beer (355 mL), one 5 oz glass of wine (148 mL), or one 1 oz glass of hard liquor (44 mL). General instructions Schedule regular health, dental, and eye exams. Stay current with your vaccines. Tell your health care provider if: You often feel depressed. You have ever been abused or do not feel safe at home. Summary Adopting a healthy lifestyle and getting preventive care are important in promoting health and wellness. Follow your health care provider's instructions about healthy diet, exercising, and getting tested or screened for diseases. Follow your health care provider's instructions on monitoring your cholesterol and blood pressure. This information is not intended to replace advice given to you by your health care provider. Make sure you discuss any questions you have with your healthcare provider. Document Revised: 12/21/2017 Document Reviewed: 12/21/2017 Elsevier Patient Education  2022 [place].  Steps to Quit Smoking Smoking tobacco is the leading cause of preventable death. It can affect almost every organ in the body. Smoking puts you and people around you at risk for many serious, long-lasting (chronic) diseases. Quitting smoking can be hard, but it is one of the best things thatyou can do for your health. It is never too late to quit. How do I get ready to quit? When you decide to quit smoking, make a plan to help you succeed. Before you quit: Pick a date to  quit. Set a date within the next 2 weeks to give you time to prepare. Write down the reasons why you are quitting. Keep this list in places where you will see it often. Tell your family, friends, and co-workers that you are quitting. Their support is important. Talk with your doctor about the choices that may help you quit. Find out if your health insurance will pay for these treatments. Know the people, places, things, and activities that make you want to smoke (triggers). Avoid them. What first steps can I take to quit smoking? Throw away all cigarettes at home, at work, and in your car. Throw away the things that you use when you smoke, such as ashtrays and lighters. Clean your car. Make sure to empty the ashtray. Clean your home, including curtains and carpets. What can I do to help me quit smoking? Talk with your doctor about taking medicines and seeing a counselor at the same time. You are more likely to succeed when you do both. If you are pregnant or breastfeeding, talk with your doctor about counseling or other ways to quit smoking. Do not take medicine to help you quit smoking unless your doctor tells you to do so. To quit smoking: Quit right away Quit smoking totally, instead of slowly cutting back on how much you smoke over a period of time. Go to counseling. You are more likely to quit if you go to counseling sessions regularly. Take medicine You may take medicines to help you quit. Some medicines need a prescription, and some you can buy over-the-counter. Some medicines may contain a drug called nicotine to replace the nicotine in cigarettes. Medicines may: Help you to stop having the desire to smoke (cravings). Help to stop the problems that come when you stop smoking (withdrawal symptoms). Your doctor may ask you to use: Nicotine patches, gum, or lozenges.  Nicotine inhalers or sprays. Non-nicotine medicine that is taken by mouth. Find resources Find resources and other ways  to help you quit smoking and remain smoke-free after you quit. These resources are most helpful when you use them often. They include: Online chats with a Social worker. Phone quitlines. Printed Furniture conservator/restorer. Support groups or group counseling. Text messaging programs. Mobile phone apps. Use apps on your mobile phone or tablet that can help you stick to your quit plan. There are many free apps for mobile phones and tablets as well as websites. Examples include Quit Guide from the [employer] and smokefree.gov  What things can I do to make it easier to quit?  Talk to your family and friends. Ask them to support and encourage you. Call a phone quitline (1-800-QUIT-NOW), reach out to support groups, or work with a Social worker. Ask people who smoke to not smoke around you. Avoid places that make you want to smoke, such as: Bars. Parties. Smoke-break areas at work. Spend time with people who do not smoke. Lower the stress in your life. Stress can make you want to smoke. Try these things to help your stress: Getting regular exercise. Doing deep-breathing exercises. Doing yoga. Meditating. Doing a body scan. To do this, close your eyes, focus on one area of your body at a time from head to toe. Notice which parts of your body are tense. Try to relax the muscles in those areas. How will I feel when I quit smoking? Day 1 to 3 weeks Within the first 24 hours, you may start to have some problems that come from quitting tobacco. These problems are very bad 2-3 days after you quit, but they do not often last for more than 2-3 weeks. You may get these symptoms: Mood swings. Feeling restless, nervous, angry, or annoyed. Trouble concentrating. Dizziness. Strong desire for high-sugar foods and nicotine. Weight gain. Trouble pooping (constipation). Feeling like you may vomit (nausea). Coughing or a sore throat. Changes in how the medicines that you take for other issues work in your  body. Depression. Trouble sleeping (insomnia). Week 3 and afterward After the first 2-3 weeks of quitting, you may start to notice more positive results, such as: Better sense of smell and taste. Less coughing and sore throat. Slower heart rate. Lower blood pressure. Clearer skin. Better breathing. Fewer sick days. Quitting smoking can be hard. Do not give up if you fail the first time. Some people need to try a few times before they succeed. Do your best to stick to your quit plan, and talk with yourdoctor if you have any questions or concerns. Summary Smoking tobacco is the leading cause of preventable death. Quitting smoking can be hard, but it is one of the best things that you can do for your health. When you decide to quit smoking, make a plan to help you succeed. Quit smoking right away, not slowly over a period of time. When you start quitting, seek help from your doctor, family, or friends. This information is not intended to replace advice given to you by your health care provider. Make sure you discuss any questions you have with your healthcare provider. Document Revised: 09/22/2018 Document Reviewed: 03/18/2018 Elsevier Patient Education  [place].

## 2020-08-04 NOTE — Progress Notes (Signed)
   S:    PCP: Dr. Wynetta Emery   Patient arrives in good spirits. Presents to the clinic for hypertension evaluation, counseling, and management. Patient was referred and last seen by Primary Care Provider on 05/23/2020. I saw him on 07/03/2020 but did not recommend any changes. He had an AWV this morning and his BP was elevated. He tells me that this was most likely d/t taking the medications just prior to that visit.    Today, pt reports doing well. Denies chest pains, dyspnea, HA or swelling. Denies any medication side effects.   Medication adherence reported. Denies any missed doses.  Current BP Medications include:  diltiazem 300 mg daily, irbesartan 300 mg daily, Toprol-XL 100 mg daily   Dietary habits include: cut out pork; does endorse using seasoning salt/adding salt to food Exercise habits include: none  Family / Social history:  -FHX: DM, HTN HF, kidney disease  -Tobacco:   O:  Vitals:   08/04/20 1449  BP: 116/71  Pulse: (!) 49    Home BP readings: none  Last 3 Office BP readings: BP Readings from Last 3 Encounters:  08/04/20 116/71  08/04/20 (!) 152/85  07/03/20 (!) 153/93    BMET    Component Value Date/Time   NA 138 03/12/2020 1533   K 4.2 03/12/2020 1533   CL 104 03/12/2020 1533   CO2 26 03/12/2020 1533   GLUCOSE 79 03/12/2020 1533   BUN 14 03/12/2020 1533   CREATININE 1.25 03/12/2020 1533   CALCIUM 9.1 03/12/2020 1533   GFRNONAA 59 (L) 08/30/2019 0943   GFRAA 69 08/30/2019 0943    Renal function: CrCl cannot be calculated (Patient's most recent lab result is older than the maximum 21 days allowed.).  Clinical ASCVD: No  The ASCVD Risk score Mikey Bussing DC Jr., et al., 2013) failed to calculate for the following reasons:   Unable to determine if patient is Non-Hispanic African American    A/P: Hypertension longstanding currently at goal on current medications. BP Goal = < 130/80 mmHg. Medication adherence reported. - No changes today.    -Counseled on  lifestyle modifications for blood pressure control including reduced dietary sodium, increased exercise, adequate sleep.  Results reviewed and written information provided.   Total time in face-to-face counseling 30 minutes. F/U Clinic Visit with PCP.  Benard Halsted, PharmD, [facility], [place] [phone]

## 2020-08-04 NOTE — Progress Notes (Signed)
Subjective:   Albert Bartold. is a 65 y.o. male who presents for Medicare Annual/Subsequent preventive examination.  Review of Systems    Review of Systems  Constitutional: Negative.   HENT: Negative.    Eyes: Negative.   Respiratory: Negative.    Cardiovascular: Negative.   Gastrointestinal: Negative.   Genitourinary: Negative.   Musculoskeletal: Negative.   Skin: Negative.   Neurological: Negative.   Endo/Heme/Allergies: Negative.   Psychiatric/Behavioral: Negative.      Cardiac Risk Factors include: advanced age (>53mn, >>74women);sedentary lifestyle;male gender;smoking/ tobacco exposure;hypertension     Objective:    Today's Vitals   08/04/20 0841  BP: (!) 152/85  Pulse: 61  Resp: 18  Temp: 97.9 F (36.6 C)  SpO2: 99%  Weight: 222 lb (100.7 kg)  Height: '6\' 2"'$  (1.88 m)   Body mass index is 28.5 kg/m.  Advanced Directives 08/04/2020 01/19/2020  Does Patient Have a Medical Advance Directive? No No  Would patient like information on creating a medical advance directive? No - Patient declined No - Patient declined    Current Medications (verified) Outpatient Encounter Medications as of 08/04/2020  Medication Sig   allopurinol (ZYLOPRIM) 300 MG tablet TAKE 1 TABLET (300 MG TOTAL) BY MOUTH DAILY. TO TREAT GOUT   apixaban (ELIQUIS) 5 MG TABS tablet TAKE 1 TABLET (5 MG TOTAL) BY MOUTH 2 (TWO) TIMES DAILY.   colchicine 0.6 MG tablet Take 1 tablet (0.6 mg total) by mouth daily.   diltiazem (CARDIZEM CD) 300 MG 24 hr capsule TAKE 1 CAPSULE (300 MG TOTAL) BY MOUTH DAILY.   flecainide (TAMBOCOR) 50 MG tablet TAKE 1 TABLET (50 MG TOTAL) BY MOUTH DAILY.   irbesartan (AVAPRO) 300 MG tablet TAKE 1 TABLET (300 MG TOTAL) BY MOUTH DAILY.   metoprolol succinate (TOPROL-XL) 100 MG 24 hr tablet TAKE 1 TABLET (100 MG TOTAL) BY MOUTH DAILY. TAKE WITH OR IMMEDIATELY FOLLOWING A MEAL.   tamsulosin (FLOMAX) 0.4 MG CAPS capsule TAKE 2 CAPSULES (0.8 MG TOTAL) BY MOUTH AT BEDTIME.    No facility-administered encounter medications on file as of 08/04/2020.    Allergies (verified) Other   History: Past Medical History:  Diagnosis Date   BPH associated with nocturia 03/12/2018   BPH with obstruction/lower urinary tract symptoms    Chronic gout of multiple sites 03/12/2018   Essential hypertension    Gout    Long-term use of aspirin therapy 03/12/2018   OSA (obstructive sleep apnea)    Paroxysmal atrial fibrillation (HCC)    Past Surgical History:  Procedure Laterality Date   LIPOSUCTION     Family History  Problem Relation Age of Onset   Diabetes Mother    Hypertension Father    Diabetes Sister    Diabetes Brother    Heart failure Brother    Kidney failure Brother    Social History   Socioeconomic History   Marital status: Unknown    Spouse name: Not on file   Number of children: Not on file   Years of education: Not on file   Highest education level: Not on file  Occupational History   Not on file  Tobacco Use   Smoking status: Former   Smokeless tobacco: Current    Types: Snuff  Vaping Use   Vaping Use: Never used  Substance and Sexual Activity   Alcohol use: Not Currently   Drug use: Not Currently   Sexual activity: Yes  Other Topics Concern   Not on file  Social History Narrative  Not on file   Social Determinants of Health   Financial Resource Strain: Medium Risk   Difficulty of Paying Living Expenses: Somewhat hard  Food Insecurity: No Food Insecurity   Worried About Running Out of Food in the Last Year: Never true   Ran Out of Food in the Last Year: Never true  Transportation Needs: No Transportation Needs   Lack of Transportation (Medical): No   Lack of Transportation (Non-Medical): No  Physical Activity: Insufficiently Active   Days of Exercise per Week: 3 days   Minutes of Exercise per Session: 40 min  Stress: No Stress Concern Present   Feeling of Stress : Not at all  Social Connections: Socially Isolated   Frequency  of Communication with Friends and Family: Twice a week   Frequency of Social Gatherings with Friends and Family: Once a week   Attends Religious Services: Never   Marine scientist or Organizations: No   Attends Music therapist: Never   Marital Status: Never married    Tobacco Counseling Ready to quit: No Counseling given: Yes   Clinical Intake:  Pre-visit preparation completed: No  Pain : No/denies pain     BMI - recorded: 28.5 Nutritional Status: BMI 25 -29 Overweight Nutritional Risks: None Diabetes: No  How often do you need to have someone help you when you read instructions, pamphlets, or other written materials from your doctor or pharmacy?: 1 - Never What is the last grade level you completed in school?: 10th  Diabetic?no  Interpreter Needed?: No      Activities of Daily Living In your present state of health, do you have any difficulty performing the following activities: 08/04/2020  Hearing? N  Vision? N  Difficulty concentrating or making decisions? N  Walking or climbing stairs? N  Dressing or bathing? N  Doing errands, shopping? N  Preparing Food and eating ? N  Using the Toilet? N  In the past six months, have you accidently leaked urine? N  Do you have problems with loss of bowel control? N  Managing your Medications? N  Managing your Finances? N  Housekeeping or managing your Housekeeping? N  Some recent data might be hidden    Patient Care Team: Ladell Pier, MD as PCP - General (Internal Medicine) Fay Records, MD as PCP - Cardiology (Cardiology) Sueanne Margarita, MD as PCP - Sleep Medicine (Cardiology)  Indicate any recent Medical Services you may have received from other than [facility] providers in the past year (date may be approximate).     Assessment:   This is a routine wellness examination for Albert Good.  Dietary issues and exercise activities discussed: Current Exercise Habits: The patient has a physically  strenuous job, but has no regular exercise apart from work., Exercise limited by: None identified   Goals Addressed             This Visit's Progress    Have 3 meals a day         Depression Screen PHQ 2/9 Scores 08/04/2020 06/18/2020 05/23/2020 01/24/2020 07/13/2018 03/09/2018  PHQ - 2 Score 0 0 0 '1 1 2  '$ PHQ- 9 Score - - - '3 2 4    '$ Fall Risk Fall Risk  08/04/2020 06/18/2020 05/23/2020 01/24/2020 07/13/2018  Falls in the past year? 0 0 0 0 0  Number falls in past yr: 0 - 0 0 0  Injury with Fall? 0 - 0 0 0  Risk for fall due to :  No Fall Risks - No Fall Risks - -  Follow up Education provided Falls evaluation completed - - Falls evaluation completed    FALL RISK PREVENTION PERTAINING TO THE HOME:  Any stairs in or around the home? No  If so, are there any without handrails? No  Home free of loose throw rugs in walkways, pet beds, electrical cords, etc? Yes  Adequate lighting in your home to reduce risk of falls? Yes   ASSISTIVE DEVICES UTILIZED TO PREVENT FALLS:  Life alert? No  Use of a cane, walker or w/c? No  Grab bars in the bathroom? No  Shower chair or bench in shower? No  Elevated toilet seat or a handicapped toilet? No   TIMED UP AND GO:  Was the test performed? Yes .  Length of time to ambulate 10 feet: 3 sec.   Gait steady and fast without use of assistive device  Cognitive Function:        Immunizations Immunization History  Administered Date(s) Administered   Influenza,inj,Quad PF,6+ Mos 09/28/2018, 01/24/2020   PFIZER(Purple Top)SARS-COV-2 Vaccination 03/23/2019, 04/23/2019   Pneumococcal Conjugate-13 01/24/2020   Tdap 05/23/2020    TDAP status: Up to date  Flu Vaccine status: Up to date  Pneumococcal vaccine status: Up to date  Covid-19 vaccine status: Completed vaccines  Qualifies for Shingles Vaccine? Yes   Zostavax completed No   Shingrix Completed?: No.    Education has been provided regarding the importance of this vaccine. Patient has been  advised to call insurance company to determine out of pocket expense if they have not yet received this vaccine. Advised may also receive vaccine at local pharmacy or Health Dept. Verbalized acceptance and understanding.  Screening Tests Health Maintenance  Topic Date Due   Hepatitis C Screening  Never done   Zoster Vaccines- Shingrix (1 of 2) Never done   COVID-19 Vaccine (3 - Pfizer risk series) 05/21/2019   INFLUENZA VACCINE  08/11/2020   PNA vac Low Risk Adult (2 of 2 - PPSV23) 01/23/2021   COLONOSCOPY (Pts 45-6yr Insurance coverage will need to be confirmed)  09/02/2021   TETANUS/TDAP  05/24/2030   HPV VACCINES  Aged Out    Health Maintenance  Health Maintenance Due  Topic Date Due   Hepatitis C Screening  Never done   Zoster Vaccines- Shingrix (1 of 2) Never done   COVID-19 Vaccine (3 - Pfizer risk series) 05/21/2019    Colorectal cancer screening: Type of screening: Colonoscopy. Completed  . Repeat every 5 years  Lung Cancer Screening: (Low Dose CT Chest recommended if Age 67-80years, 30 pack-year currently smoking OR have quit w/in 15years.) does qualify.   Lung Cancer Screening Referral: Referral placed  Additional Screening:  Hepatitis C Screening: does qualify; Completed   Vision Screening: Recommended annual ophthalmology exams for early detection of glaucoma and other disorders of the eye. Is the patient up to date with their annual eye exam?  Yes  Who is the provider or what is the name of the office in which the patient attends annual eye exams?  If pt is not established with a provider, would they like to be referred to a provider to establish care? No .   Dental Screening: Recommended annual dental exams for proper oral hygiene  Community Resource Referral / Chronic Care Management: CRR required this visit?  No   CCM required this visit?  No      Plan:     I have personally reviewed and noted the  following in the patient's chart:   Medical and  social history Use of alcohol, tobacco or illicit drugs  Current medications and supplements including opioid prescriptions. Patient is not currently taking opioid prescriptions. Functional ability and status Nutritional status Physical activity Advanced directives List of other physicians Hospitalizations, surgeries, and ER visits in previous 12 months Vitals Screenings to include cognitive, depression, and falls Referrals and appointments  In addition, I have reviewed and discussed with patient certain preventive protocols, quality metrics, and best practice recommendations. A written personalized care plan for preventive services as well as general preventive health recommendations were provided to patient.     Fenton Foy, NP   08/04/2020

## 2020-08-05 DIAGNOSIS — G4733 Obstructive sleep apnea (adult) (pediatric): Secondary | ICD-10-CM | POA: Diagnosis not present

## 2020-08-08 ENCOUNTER — Other Ambulatory Visit: Payer: Self-pay

## 2020-08-08 MED FILL — Irbesartan Tab 300 MG: ORAL | 30 days supply | Qty: 30 | Fill #2 | Status: AC

## 2020-08-08 MED FILL — Allopurinol Tab 300 MG: ORAL | 30 days supply | Qty: 30 | Fill #2 | Status: AC

## 2020-08-08 MED FILL — Apixaban Tab 5 MG: ORAL | 30 days supply | Qty: 60 | Fill #3 | Status: AC

## 2020-08-13 ENCOUNTER — Other Ambulatory Visit: Payer: Self-pay

## 2020-08-15 ENCOUNTER — Other Ambulatory Visit: Payer: Self-pay

## 2020-08-18 ENCOUNTER — Other Ambulatory Visit: Payer: Self-pay

## 2020-08-22 ENCOUNTER — Other Ambulatory Visit: Payer: Self-pay

## 2020-08-22 ENCOUNTER — Other Ambulatory Visit: Payer: Self-pay | Admitting: Internal Medicine

## 2020-08-22 DIAGNOSIS — M1A09X0 Idiopathic chronic gout, multiple sites, without tophus (tophi): Secondary | ICD-10-CM

## 2020-08-22 MED ORDER — COLCHICINE 0.6 MG PO TABS
0.6000 mg | ORAL_TABLET | Freq: Every day | ORAL | 2 refills | Status: DC
  Filled 2020-08-22: qty 30, 30d supply, fill #0
  Filled 2020-09-21: qty 30, 30d supply, fill #1
  Filled 2020-10-19: qty 30, 30d supply, fill #2

## 2020-08-22 MED FILL — Metoprolol Succinate Tab ER 24HR 100 MG (Tartrate Equiv): ORAL | 30 days supply | Qty: 30 | Fill #3 | Status: AC

## 2020-08-22 NOTE — Telephone Encounter (Signed)
Requested medication (s) are due for refill today: yes  Requested medication (s) are on the active medication list: yes  Last refill:  07/18/2020  Future visit scheduled: yes  Notes to clinic:  Failed protocol: Uric Acid in normal range and within 360 days   Requested Prescriptions  Pending Prescriptions Disp Refills   colchicine 0.6 MG tablet 30 tablet 0    Sig: Take 1 tablet (0.6 mg total) by mouth daily.     Endocrinology:  Gout Agents Failed - 08/22/2020  6:54 AM      Failed - Uric Acid in normal range and within 360 days    Uric Acid  Date Value Ref Range Status  09/28/2018 3.4 (L) 3.7 - 8.6 mg/dL Final    Comment:               Therapeutic target for gout patients: <6.0          Passed - Cr in normal range and within 360 days    Creatinine, Ser  Date Value Ref Range Status  03/12/2020 1.25 0.76 - 1.27 mg/dL Final          Passed - Valid encounter within last 12 months    Recent Outpatient Visits           2 weeks ago Primary hypertension   [place], Jarome Matin, RPH-CPP   1 month ago Primary hypertension   [place], Jarome Matin, RPH-CPP   2 months ago Medicare annual wellness visit, initial   [place], FNP   3 months ago Essential hypertension   [place], Deborah B, MD   6 months ago Essential hypertension   [place], RPH-CPP       Future Appointments             In 1 month Wynetta Emery, Dalbert Batman, MD [place]

## 2020-08-25 ENCOUNTER — Ambulatory Visit: Payer: Self-pay | Admitting: *Deleted

## 2020-08-25 ENCOUNTER — Other Ambulatory Visit: Payer: Self-pay

## 2020-08-25 NOTE — Telephone Encounter (Signed)
Reason for Disposition . Suspicious history for the injury    No suspicious for domestic violence.    No injuries or accidents.   Been there a few weeks.    No protocol really fit his description.   He was instructed to go on to the urgent care today which he was agreeable to going.  Answer Assessment - Initial Assessment Questions 1. MECHANISM: "How did the injury happen?" (Injuries to the penis can occur during sexual intercourse or masturbation; the caller may not be willing to mention this)      I have on the left side underneath my ribs pain and swelling.   No falls or accidents.   I saw a urologist and he put me on medication.   I don't know if that caused it or not.    I stopped the medicine.   Urologist told me it wasn't the medicine that I needed to call my PCP. 2. ONSET: "When did the injury happen?" (Minutes or hours ago)      No injury.     It's been there a few weeks now.  No shortness of breath.   3. LOCATION: "What part of the genitals are injured?"      See notes 4. APPEARANCE of INJURY: "What does the injury look like?"      *No Answer* 5. BLEEDING: "Is the injury still bleeding?" If Yes, ask: "Is it difficult to stop?"      It just feels swollen 6. SIZE: For cuts, bruises, or swelling, ask: "How large is it?" (e.g., inches or centimeters)      No cuts 7. PAIN: "Is it painful?" If Yes, ask: "How bad is the pain?"    (e.g., Scale 1-10; or mild, moderate, severe)     Yes 8. TETANUS: For any breaks in the skin, ask: "When was the last tetanus booster?"     N?A 9. OTHER SYMPTOMS: "Do you have any other symptoms?" (e.g., blood from tip of penis, bloody urine)     N/A  Protocols used: Genital Injury - Male-A-AH

## 2020-08-25 NOTE — Telephone Encounter (Signed)
Pt called in c/o swelling on the left side underneath his rib cage and pain that's been there for "a few weeks now".   Denies shortness of breath.   No injuries or accidents.   See triage notes.  I made him an appt with Dr. Karle Plumber for 10/23/2020 at 3:10 in office visit.   He is going to go to the urgent care near him.

## 2020-08-26 ENCOUNTER — Other Ambulatory Visit: Payer: Self-pay

## 2020-09-02 ENCOUNTER — Other Ambulatory Visit: Payer: Self-pay | Admitting: *Deleted

## 2020-09-02 DIAGNOSIS — Z87891 Personal history of nicotine dependence: Secondary | ICD-10-CM

## 2020-09-05 ENCOUNTER — Other Ambulatory Visit: Payer: Self-pay | Admitting: Internal Medicine

## 2020-09-05 ENCOUNTER — Other Ambulatory Visit: Payer: Self-pay

## 2020-09-05 DIAGNOSIS — M1A09X0 Idiopathic chronic gout, multiple sites, without tophus (tophi): Secondary | ICD-10-CM

## 2020-09-05 DIAGNOSIS — G4733 Obstructive sleep apnea (adult) (pediatric): Secondary | ICD-10-CM | POA: Diagnosis not present

## 2020-09-05 MED FILL — Irbesartan Tab 300 MG: ORAL | 30 days supply | Qty: 30 | Fill #3 | Status: AC

## 2020-09-05 NOTE — Telephone Encounter (Signed)
Requested medications are due for refill today No, filled for 90 day supply 08/08/20  Requested medications are on the active medication list yes  Last refill 08/08/20  Last visit 08/2019, Uric acid level is from 09/2018  Future visit scheduled 09/26/20  Notes to clinic Failed protocol due to no Uric acid since 09/2018, there is an upcoming appt scheduled.

## 2020-09-08 ENCOUNTER — Other Ambulatory Visit: Payer: Self-pay

## 2020-09-09 ENCOUNTER — Other Ambulatory Visit: Payer: Self-pay

## 2020-09-09 ENCOUNTER — Other Ambulatory Visit: Payer: Self-pay | Admitting: Physician Assistant

## 2020-09-09 MED ORDER — ALLOPURINOL 300 MG PO TABS
ORAL_TABLET | ORAL | 1 refills | Status: DC
  Filled 2020-09-09: qty 90, 90d supply, fill #0
  Filled 2020-12-10: qty 90, 90d supply, fill #1

## 2020-09-10 NOTE — Telephone Encounter (Signed)
Requested medications are due for refill today.  yes  Requested medications are on the active medications list.  yes  Last refill. 08/30/2019  Future visit scheduled.   yes  Notes to clinic.  Rx was signed by Freeman Caldron. Prescription is expired.

## 2020-09-11 ENCOUNTER — Other Ambulatory Visit: Payer: Self-pay

## 2020-09-11 MED ORDER — APIXABAN 5 MG PO TABS
5.0000 mg | ORAL_TABLET | Freq: Two times a day (BID) | ORAL | 0 refills | Status: DC
  Filled 2020-09-11: qty 60, 30d supply, fill #0

## 2020-09-12 ENCOUNTER — Other Ambulatory Visit: Payer: Self-pay

## 2020-09-16 ENCOUNTER — Other Ambulatory Visit: Payer: Self-pay

## 2020-09-21 MED FILL — Metoprolol Succinate Tab ER 24HR 100 MG (Tartrate Equiv): ORAL | 30 days supply | Qty: 30 | Fill #4 | Status: AC

## 2020-09-22 ENCOUNTER — Other Ambulatory Visit: Payer: Self-pay

## 2020-09-23 ENCOUNTER — Other Ambulatory Visit: Payer: Self-pay

## 2020-09-23 DIAGNOSIS — R109 Unspecified abdominal pain: Secondary | ICD-10-CM | POA: Diagnosis not present

## 2020-09-24 ENCOUNTER — Ambulatory Visit
Admission: RE | Admit: 2020-09-24 | Discharge: 2020-09-24 | Disposition: A | Discharge status: Home / Self Care | Payer: Medicare HMO | Source: Ambulatory Visit | Attending: Acute Care | Admitting: Acute Care

## 2020-09-24 ENCOUNTER — Encounter: Payer: Self-pay | Admitting: Acute Care

## 2020-09-24 ENCOUNTER — Ambulatory Visit (INDEPENDENT_AMBULATORY_CARE_PROVIDER_SITE_OTHER): Payer: Medicare HMO | Admitting: Acute Care

## 2020-09-24 ENCOUNTER — Other Ambulatory Visit: Payer: Self-pay

## 2020-09-24 DIAGNOSIS — Z87891 Personal history of nicotine dependence: Secondary | ICD-10-CM

## 2020-09-24 NOTE — Progress Notes (Addendum)
Virtual Visit via Telephone Note  I connected with Albert Good. on 09/24/20 at 12:00 PM EDT by telephone and verified that I am speaking with the correct person using two identifiers.  Location: Patient: At home Provider: [place], [place], [place], [address]    I discussed the limitations, risks, security and privacy concerns of performing an evaluation and management service by telephone and the availability of in person appointments. I also discussed with the patient that there may be a patient responsible charge related to this service. The patient expressed understanding and agreed to proceed.   Shared Decision Making Visit Lung Cancer Screening Program [id number])   Eligibility: Age 65 y.o.   Pack Years Smoking History Calculation 35 pack years (# packs/per year x # years smoked) Recent History of coughing up blood  no Unexplained weight loss? no ( >Than 15 pounds within the last 6 months ) Prior History Lung / other cancer no (Diagnosis within the last 5 years already requiring surveillance chest CT Scans). Smoking Status Former Smoker Former Smokers: Years since quit: 14 years  Quit Date: 2008  Visit Components: Discussion included one or more decision making aids. yes Discussion included risk/benefits of screening. yes Discussion included potential follow up diagnostic testing for abnormal scans. yes Discussion included meaning and risk of over diagnosis. yes Discussion included meaning and risk of False Positives. yes Discussion included meaning of total radiation exposure. yes  Counseling Included: Importance of adherence to annual lung cancer LDCT screening. yes Impact of comorbidities on ability to participate in the program. yes Ability and willingness to under diagnostic treatment. yes  Smoking Cessation Counseling: Current Smokers:  Discussed importance of smoking cessation. yes Information about tobacco cessation classes and interventions  provided to patient. yes Patient provided with "ticket" for LDCT Scan. yes Symptomatic Patient. no  Counseling Diagnosis Code: Tobacco Use Z72.0 Asymptomatic Patient yes  Counseling (Intermediate counseling: > three minutes counseling) [id number] Former Smokers:  Discussed the importance of maintaining cigarette abstinence. yes Diagnosis Code: Personal History of Nicotine Dependence. [id number] Information about tobacco cessation classes and interventions provided to patient. Yes Patient provided with "ticket" for LDCT Scan. yes Written Order for Lung Cancer Screening with LDCT placed in Epic. Yes (CT Chest Lung Cancer Screening Low Dose W/O CM) [id number] Z12.2-Screening of respiratory organs Z87.891-Personal history of nicotine dependence  I spent 25 minutes of face to face time with Albert Good discussing the risks and benefits of lung cancer screening. We viewed a power point together that explained in detail the above noted topics. We took the time to pause the power point at intervals to allow for questions to be asked and answered to ensure understanding. We discussed that he had taken the single most powerful action possible to decrease his risk of developing lung cancer when he quit smoking. I counseled him to remain smoke free, and to contact me if he ever had the desire to smoke again so that I can provide resources and tools to help support the effort to remain smoke free. We discussed the time and location of the scan, and that either  Doroteo Glassman RN or I will call with the results within  24-48 hours of receiving them. He has my card and contact information in the event he needs to speak with me, in addition to a copy of the power point we reviewed as a resource. He verbalized understanding of all of the above and had no further questions upon leaving the office.  I explained to the patient that there has been a high incidence of coronary artery disease noted on these exams. I explained  that this is a non-gated exam therefore degree or severity cannot be determined. This patient is not on statin therapy. I have asked the patient to follow-up with their PCP regarding any incidental finding of coronary artery disease and management with diet or medication as they feel is clinically indicated. The patient verbalized understanding of the above and had no further questions.     Magdalen Spatz, NP 09/24/2020

## 2020-09-24 NOTE — Patient Instructions (Signed)
Thank you for participating in the [facility] Cancer Screening Program. It was our pleasure to meet you today. We will call you with the results of your scan within the next few days. Your scan will be assigned a Lung RADS category score by the physicians reading the scans.  This Lung RADS score determines follow up scanning.  See below for description of categories, and follow up screening recommendations. We will be in touch to schedule your follow up screening annually or based on recommendations of our providers. We will fax a copy of your scan results to your Primary Care Physician, or the physician who referred you to the program, to ensure they have the results. Please call the office if you have any questions or concerns regarding your scanning experience or results.  Our office number is [phone]. Please speak with Denise Phelps, RN. She is our Lung Cancer Screening RN. If she is unavailable when you call, please have the office staff send her a message. She will return your call at her earliest convenience. Remember, if your scan is normal, we will scan you annually as long as you continue to meet the criteria for the program. (Age 55-77, Current smoker or smoker who has quit within the last 15 years). If you are a smoker, remember, quitting is the single most powerful action that you can take to decrease your risk of lung cancer and other pulmonary, breathing related problems. We know quitting is hard, and we are here to help.  Please let us know if there is anything we can do to help you meet your goal of quitting. If you are a former smoker, congratulations. We are proud of you! Remain smoke free! Remember you can refer friends or family members through the number above.  We will screen them to make sure they meet criteria for the program. Thank you for helping us take better care of you by participating in Lung Screening.  Lung RADS Categories:  Lung RADS 1: no nodules  or definitely non-concerning nodules.  Recommendation is for a repeat annual scan in 12 months.  Lung RADS 2:  nodules that are non-concerning in appearance and behavior with a very low likelihood of becoming an active cancer. Recommendation is for a repeat annual scan in 12 months.  Lung RADS 3: nodules that are probably non-concerning , includes nodules with a low likelihood of becoming an active cancer.  Recommendation is for a 6-month repeat screening scan. Often noted after an upper respiratory illness. We will be in touch to make sure you have no questions, and to schedule your 6-month scan.  Lung RADS 4 A: nodules with concerning findings, recommendation is most often for a follow up scan in 3 months or additional testing based on our provider's assessment of the scan. We will be in touch to make sure you have no questions and to schedule the recommended 3 month follow up scan.  Lung RADS 4 B:  indicates findings that are concerning. We will be in touch with you to schedule additional diagnostic testing based on our provider's  assessment of the scan.   

## 2020-09-26 ENCOUNTER — Ambulatory Visit: Payer: Medicare HMO | Admitting: Internal Medicine

## 2020-09-29 ENCOUNTER — Other Ambulatory Visit: Payer: Self-pay | Admitting: *Deleted

## 2020-09-29 DIAGNOSIS — Z87891 Personal history of nicotine dependence: Secondary | ICD-10-CM

## 2020-10-06 DIAGNOSIS — G4733 Obstructive sleep apnea (adult) (pediatric): Secondary | ICD-10-CM | POA: Diagnosis not present

## 2020-10-07 ENCOUNTER — Other Ambulatory Visit: Payer: Self-pay | Admitting: Internal Medicine

## 2020-10-07 ENCOUNTER — Other Ambulatory Visit: Payer: Self-pay

## 2020-10-07 MED FILL — Flecainide Acetate Tab 50 MG: ORAL | 90 days supply | Qty: 90 | Fill #1 | Status: AC

## 2020-10-07 MED FILL — Diltiazem HCl Coated Beads Cap ER 24HR 300 MG: ORAL | 90 days supply | Qty: 90 | Fill #1 | Status: AC

## 2020-10-07 NOTE — Telephone Encounter (Signed)
Requested medication (s) are due for refill today:   Yes  Requested medication (s) are on the active medication list:   Yes  Future visit scheduled:   Yes   Last ordered: 09/11/2020 #60, 0 refills  Returned  because rx will expire on 10/12/2020.  Requested Prescriptions  Pending Prescriptions Disp Refills   apixaban (ELIQUIS) 5 MG TABS tablet 60 tablet 0    Sig: Take 1 tablet (5 mg total) by mouth 2 (two) times daily.     Hematology:  Anticoagulants Passed - 10/07/2020  6:48 AM      Passed - HGB in normal range and within 360 days    Hemoglobin  Date Value Ref Range Status  03/12/2020 14.1 13.0 - 17.7 g/dL Final          Passed - PLT in normal range and within 360 days    Platelets  Date Value Ref Range Status  03/12/2020 199 150 - 450 x10E3/uL Final          Passed - HCT in normal range and within 360 days    Hematocrit  Date Value Ref Range Status  03/12/2020 40.6 37.5 - 51.0 % Final          Passed - Cr in normal range and within 360 days    Creatinine, Ser  Date Value Ref Range Status  03/12/2020 1.25 0.76 - 1.27 mg/dL Final          Passed - Valid encounter within last 12 months    Recent Outpatient Visits           2 months ago Primary hypertension   [place], Jarome Matin, RPH-CPP   3 months ago Primary hypertension   [place], Jarome Matin, RPH-CPP   3 months ago Medicare annual wellness visit, initial   [place], FNP   4 months ago Essential hypertension   [place], Deborah B, MD   8 months ago Essential hypertension   [place], RPH-CPP       Future Appointments             In 2 weeks Ladell Pier, MD [place]

## 2020-10-09 ENCOUNTER — Other Ambulatory Visit: Payer: Self-pay

## 2020-10-09 MED ORDER — APIXABAN 5 MG PO TABS
5.0000 mg | ORAL_TABLET | Freq: Two times a day (BID) | ORAL | 0 refills | Status: DC
  Filled 2020-10-09: qty 60, 30d supply, fill #0

## 2020-10-09 MED FILL — Irbesartan Tab 300 MG: ORAL | 30 days supply | Qty: 30 | Fill #4 | Status: AC

## 2020-10-10 ENCOUNTER — Other Ambulatory Visit: Payer: Self-pay

## 2020-10-19 MED FILL — Metoprolol Succinate Tab ER 24HR 100 MG (Tartrate Equiv): ORAL | 30 days supply | Qty: 30 | Fill #5 | Status: AC

## 2020-10-20 ENCOUNTER — Other Ambulatory Visit: Payer: Self-pay

## 2020-10-23 ENCOUNTER — Ambulatory Visit: Payer: Medicare HMO | Admitting: Internal Medicine

## 2020-10-24 ENCOUNTER — Other Ambulatory Visit: Payer: Self-pay

## 2020-10-29 ENCOUNTER — Encounter: Payer: Self-pay | Admitting: Internal Medicine

## 2020-10-29 DIAGNOSIS — G4733 Obstructive sleep apnea (adult) (pediatric): Secondary | ICD-10-CM | POA: Diagnosis not present

## 2020-10-29 NOTE — Progress Notes (Signed)
Note received from [facility].  Patient was seen 10/21/2020.  He had CAT scan of the abdomen and pelvis.  This revealed a 6 cm cystic lesion in the right lower pole of the kidney.  Concern is for right renal cell carcinoma.  Plan is to get an MRI with and without contrast.  Patient tentatively scheduled for right robotic assisted laparoscopic partial nephrectomy pending the results of the MRI. 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 Milford Cage in December. For erectile dysfunction he will continue on sildenafil. Elevated PSA. Patient was seen by Dr. Raynelle Bring

## 2020-11-03 ENCOUNTER — Other Ambulatory Visit: Payer: Self-pay | Admitting: Urology

## 2020-11-03 DIAGNOSIS — D49511 Neoplasm of unspecified behavior of right kidney: Secondary | ICD-10-CM

## 2020-11-05 DIAGNOSIS — G4733 Obstructive sleep apnea (adult) (pediatric): Secondary | ICD-10-CM | POA: Diagnosis not present

## 2020-11-08 ENCOUNTER — Other Ambulatory Visit: Payer: Self-pay

## 2020-11-08 ENCOUNTER — Ambulatory Visit (HOSPITAL_COMMUNITY)
Admission: EM | Admit: 2020-11-08 | Discharge: 2020-11-08 | Disposition: A | Discharge status: Home / Self Care | Payer: Medicare HMO | Attending: Emergency Medicine | Admitting: Emergency Medicine

## 2020-11-08 ENCOUNTER — Encounter (HOSPITAL_COMMUNITY): Payer: Self-pay | Admitting: Emergency Medicine

## 2020-11-08 ENCOUNTER — Other Ambulatory Visit: Payer: Self-pay | Admitting: Internal Medicine

## 2020-11-08 DIAGNOSIS — J069 Acute upper respiratory infection, unspecified: Secondary | ICD-10-CM | POA: Diagnosis not present

## 2020-11-08 MED ORDER — PROMETHAZINE-DM 6.25-15 MG/5ML PO SYRP: 5.0000 mL | ORAL_SOLUTION | Freq: Four times a day (QID) | ORAL | 0 refills | Status: DC | PRN

## 2020-11-08 MED ORDER — ACETAMINOPHEN 500 MG PO TABS: 500.0000 mg | ORAL_TABLET | Freq: Four times a day (QID) | ORAL | 0 refills | Status: DC | PRN

## 2020-11-08 MED FILL — Irbesartan Tab 300 MG: ORAL | 30 days supply | Qty: 30 | Fill #5 | Status: AC

## 2020-11-08 NOTE — ED Provider Notes (Signed)
[name]    CSN: [id number] Arrival date & time: 11/08/20  1318      History   Chief Complaint Chief Complaint  Patient presents with   Cough   Nasal Congestion    HPI Albert Good. is a 65 y.o. male.   Patient here for evaluation of cough and nasal congestion that has been ongoing for the past several days.  Patient reports having some right side pain with coughing spells.  Reports cough is worse at night.  Patient is unsure about any fevers as he has not checked his temperature at home.  Temperature 98.6 in office.  Patient denies any sick contacts.  Reports taking Alka-Seltzer with minimal symptom relief.  Denies any trauma, injury, or other precipitating event.  Denies any specific alleviating or aggravating factors.  Denies any chest pain, shortness of breath, N/V/D, numbness, tingling, weakness, abdominal pain, or headaches.    The history is provided by the patient.  Cough  Past Medical History:  Diagnosis Date   BPH associated with nocturia 03/12/2018   BPH with obstruction/lower urinary tract symptoms    Chronic gout of multiple sites 03/12/2018   Essential hypertension    Gout    Long-term use of aspirin therapy 03/12/2018   OSA (obstructive sleep apnea)    Paroxysmal atrial fibrillation [facility])     Patient Active Problem List   Diagnosis Date Noted   Need for tetanus, diphtheria, and acellular pertussis (Tdap) vaccine 05/23/2020   Overweight (BMI 25.0-29.9) 01/24/2020   OSA (obstructive sleep apnea) 01/24/2020   History of colon polyps 01/24/2020   Chronic anticoagulation 03/28/2019   Paroxysmal atrial fibrillation ([place]) 03/12/2018   Essential hypertension 03/12/2018   BPH associated with nocturia 03/12/2018   Chronic gout of multiple sites 03/12/2018   Long-term use of aspirin therapy 03/12/2018   Chest pain 03/27/2017    Past Surgical History:  Procedure Laterality Date   LIPOSUCTION         Home Medications    Prior to Admission  medications   Medication Sig Start Date End Date Taking? Authorizing Provider  acetaminophen (TYLENOL) 500 MG tablet Take 1 tablet (500 mg total) by mouth every 6 (six) hours as needed. 11/08/20  Yes Pearson Forster, NP  promethazine-dextromethorphan (PROMETHAZINE-DM) 6.25-15 MG/5ML syrup Take 5 mLs by mouth 4 (four) times daily as needed for cough. 11/08/20  Yes Pearson Forster, NP  allopurinol (ZYLOPRIM) 300 MG tablet TAKE 1 TABLET (300 MG TOTAL) BY MOUTH DAILY. TO TREAT GOUT 09/09/20 09/09/21  Ladell Pier, MD  apixaban (ELIQUIS) 5 MG TABS tablet Take 1 tablet (5 mg total) by mouth 2 (two) times daily. 10/09/20 11/09/20  Ladell Pier, MD  colchicine 0.6 MG tablet Take 1 tablet (0.6 mg total) by mouth daily. 08/22/20 11/23/20  Ladell Pier, MD  diltiazem (CARDIZEM CD) 300 MG 24 hr capsule TAKE 1 CAPSULE (300 MG TOTAL) BY MOUTH DAILY. 03/12/20 03/12/21  Fay Records, MD  flecainide (TAMBOCOR) 50 MG tablet TAKE 1 TABLET (50 MG TOTAL) BY MOUTH DAILY. 03/12/20 03/12/21  Fay Records, MD  irbesartan (AVAPRO) 300 MG tablet TAKE 1 TABLET (300 MG TOTAL) BY MOUTH DAILY. 03/12/20 03/12/21  Fay Records, MD  metoprolol succinate (TOPROL-XL) 100 MG 24 hr tablet TAKE 1 TABLET (100 MG TOTAL) BY MOUTH DAILY. TAKE WITH OR IMMEDIATELY FOLLOWING A MEAL. 03/12/20 03/12/21  Fay Records, MD  tamsulosin (FLOMAX) 0.4 MG CAPS capsule TAKE 2 CAPSULES (0.8 MG TOTAL)  BY MOUTH AT BEDTIME. 07/21/20 07/21/21  Ladell Pier, MD    Family History Family History  Problem Relation Age of Onset   Diabetes Mother    Hypertension Father    Diabetes Sister    Diabetes Brother    Heart failure Brother    Kidney failure Brother     Social History Social History   Tobacco Use   Smoking status: Former   Smokeless tobacco: Current    Types: Snuff  Vaping Use   Vaping Use: Never used  Substance Use Topics   Alcohol use: Not Currently   Drug use: Not Currently     Allergies   Other   Review of Systems Review  of Systems  HENT:  Positive for congestion.   Respiratory:  Positive for cough.   All other systems reviewed and are negative.   Physical Exam Triage Vital Signs ED Triage Vitals  Enc Vitals Group     BP 11/08/20 1525 (!) 153/76     Pulse Rate 11/08/20 1525 70     Resp 11/08/20 1525 18     Temp 11/08/20 1525 98.6 F (37 C)     Temp src --      SpO2 11/08/20 1525 97 %     Weight --      Height --      Head Circumference --      Peak Flow --      Pain Score 11/08/20 1523 4     Pain Loc --      Pain Edu? --      Excl. in [place]? --    No data found.  Updated Vital Signs BP (!) 153/76 (BP Location: Left Arm)   Pulse 70   Temp 98.6 F (37 C)   Resp 18   SpO2 97%   Visual Acuity Right Eye Distance:   Left Eye Distance:   Bilateral Distance:    Right Eye Near:   Left Eye Near:    Bilateral Near:     Physical Exam Vitals and nursing note reviewed.  Constitutional:      General: He is not in acute distress.    Appearance: Normal appearance. He is not ill-appearing, toxic-appearing or diaphoretic.  HENT:     Head: Normocephalic and atraumatic.     Nose: Congestion present. No rhinorrhea.     Mouth/Throat:     Pharynx: No oropharyngeal exudate or posterior oropharyngeal erythema.  Eyes:     Conjunctiva/sclera: Conjunctivae normal.  Cardiovascular:     Rate and Rhythm: Normal rate and regular rhythm.     Pulses: Normal pulses.     Heart sounds: Normal heart sounds.  Pulmonary:     Effort: Pulmonary effort is normal.     Breath sounds: Normal breath sounds.  Abdominal:     General: Abdomen is flat. Bowel sounds are normal.     Palpations: Abdomen is soft.  Musculoskeletal:        General: Normal range of motion.     Cervical back: Normal range of motion.  Skin:    General: Skin is warm and dry.  Neurological:     General: No focal deficit present.     Mental Status: He is alert and oriented to person, place, and time.  Psychiatric:        Mood and Affect:  Mood normal.     UC Treatments / Results  Labs (all labs ordered are listed, but only abnormal results are displayed) Labs Reviewed -  No data to display  EKG   Radiology No results found.  Procedures Procedures (including critical care time)  Medications Ordered in UC Medications - No data to display  Initial Impression / Assessment and Plan / UC Course  I have reviewed the triage vital signs and the nursing notes.  Pertinent labs & imaging results that were available during my care of the patient were reviewed by me and considered in my medical decision making (see chart for details).    Assessment negative for red flags or concerns.  This is likely a viral illness.  Patient declines flu or COVID testing at this time.  May take Tylenol as needed.  May take Promethazine DM as needed for cough.  Instructed not to take this prior to driving as it can cause drowsiness.  Discussed conservative symptom management as described in discharge instructions.  Encourage fluids and rest.  Follow-up with primary care for reevaluation as soon as possible. Final Clinical Impressions(s) / UC Diagnoses   Final diagnoses:  Viral URI with cough     Discharge Instructions      Take the Tylenol as needed for pain relief and fever reduction.  You can take the Promethazine DM for cough at night.  Do not take this prior to driving as it can make you sleepy.  For cough: honey 1/2 to 1 teaspoon (you can dilute the honey in water or another fluid).  You can also use guaifenesin and dextromethorphan for cough. You can use a humidifier for chest congestion and cough.  If you don't have a humidifier, you can sit in the bathroom with the hot shower running.     For sore throat: try warm salt water gargles, cepacol lozenges, throat spray, warm tea or water with lemon/honey, popsicles or ice, or OTC cold relief medicine for throat discomfort.    For congestion: take a daily anti-histamine like Zyrtec,  Claritin, and a oral decongestant, such as pseudoephedrine.  You can also use Flonase 1-2 sprays in each nostril daily.    It is important to stay hydrated: drink plenty of fluids (water, gatorade/powerade/pedialyte, juices, or teas) to keep your throat moisturized and help further relieve irritation/discomfort.   Return or go to the Emergency Department if symptoms worsen or do not improve in the next few days.       ED Prescriptions     Medication Sig Dispense Auth. Provider   acetaminophen (TYLENOL) 500 MG tablet Take 1 tablet (500 mg total) by mouth every 6 (six) hours as needed. 30 tablet Pearson Forster, NP   promethazine-dextromethorphan (PROMETHAZINE-DM) 6.25-15 MG/5ML syrup Take 5 mLs by mouth 4 (four) times daily as needed for cough. 118 mL Pearson Forster, NP      PDMP not reviewed this encounter.   Pearson Forster, NP 11/08/20 [id number]

## 2020-11-08 NOTE — Telephone Encounter (Signed)
Requested medication (s) are due for refill today: yes  Requested medication (s) are on the active medication list: yes  Last refill:  10/09/20 #60  Future visit scheduled: yes  Notes to clinic:  RX expired 11/09/20 Pt canceled OV and rescheduled 11/27/20   Requested Prescriptions  Pending Prescriptions Disp Refills   apixaban (ELIQUIS) 5 MG TABS tablet 60 tablet 0    Sig: Take 1 tablet (5 mg total) by mouth 2 (two) times daily.     Hematology:  Anticoagulants Passed - 11/08/2020  9:44 AM      Passed - HGB in normal range and within 360 days    Hemoglobin  Date Value Ref Range Status  03/12/2020 14.1 13.0 - 17.7 g/dL Final          Passed - PLT in normal range and within 360 days    Platelets  Date Value Ref Range Status  03/12/2020 199 150 - 450 x10E3/uL Final          Passed - HCT in normal range and within 360 days    Hematocrit  Date Value Ref Range Status  03/12/2020 40.6 37.5 - 51.0 % Final          Passed - Cr in normal range and within 360 days    Creatinine, Ser  Date Value Ref Range Status  03/12/2020 1.25 0.76 - 1.27 mg/dL Final          Passed - Valid encounter within last 12 months    Recent Outpatient Visits           3 months ago Primary hypertension   [place], Jarome Matin, RPH-CPP   4 months ago Primary hypertension   [place], Jarome Matin, RPH-CPP   4 months ago Medicare annual wellness visit, initial   [place], FNP   5 months ago Essential hypertension   [place], Deborah B, MD   9 months ago Essential hypertension   [place], RPH-CPP       Future Appointments             In 2 weeks Ladell Pier, MD [place]

## 2020-11-08 NOTE — Discharge Instructions (Signed)
Take the Tylenol as needed for pain relief and fever reduction.  You can take the Promethazine DM for cough at night.  Do not take this prior to driving as it can make you sleepy.  For cough: honey 1/2 to 1 teaspoon (you can dilute the honey in water or another fluid).  You can also use guaifenesin and dextromethorphan for cough. You can use a humidifier for chest congestion and cough.  If you don't have a humidifier, you can sit in the bathroom with the hot shower running.     For sore throat: try warm salt water gargles, cepacol lozenges, throat spray, warm tea or water with lemon/honey, popsicles or ice, or OTC cold relief medicine for throat discomfort.    For congestion: take a daily anti-histamine like Zyrtec, Claritin, and a oral decongestant, such as pseudoephedrine.  You can also use Flonase 1-2 sprays in each nostril daily.    It is important to stay hydrated: drink plenty of fluids (water, gatorade/powerade/pedialyte, juices, or teas) to keep your throat moisturized and help further relieve irritation/discomfort.   Return or go to the Emergency Department if symptoms worsen or do not improve in the next few days.

## 2020-11-08 NOTE — ED Triage Notes (Signed)
Since last Sunday had cough, congestion, and sneezing. Reports right side pains from coughing. Had some chills. Reports has redness on tongue that burns when brushing teeth or using mouth wash.  Was told that think has cancer in kidney.

## 2020-11-08 NOTE — ED Notes (Signed)
No answer from lobby  

## 2020-11-10 ENCOUNTER — Other Ambulatory Visit: Payer: Self-pay

## 2020-11-10 ENCOUNTER — Telehealth: Payer: Self-pay | Admitting: Cardiology

## 2020-11-10 MED ORDER — APIXABAN 5 MG PO TABS
5.0000 mg | ORAL_TABLET | Freq: Two times a day (BID) | ORAL | 0 refills | Status: DC
  Filled 2020-11-10: qty 60, 30d supply, fill #0

## 2020-11-10 NOTE — Telephone Encounter (Signed)
   Name: Albert Good.  DOB: 16-Feb-1955  MRN: [id number]   Primary Cardiologist: Dorris Carnes, MD  Chart reviewed as part of pre-operative protocol coverage. Patient was contacted 11/10/2020 in reference to pre-operative risk assessment for pending surgery as outlined below.  Albert Good. was last seen on 03/12/2020 by Dr. Harrington Challenger.  Since that day, Albert Good. has done well from a cardiac standpoint.  He remains very active at work.  He can easily complete 4 METS without anginal complaints.  Therefore, based on ACC/AHA guidelines, the patient would be at acceptable risk for the planned procedure without further cardiovascular testing.   The patient was advised that if he develops new symptoms prior to surgery to contact our office to arrange for a follow-up visit, and he verbalized understanding.  Per pharmacy recommendations, patient can hold Eliquis 3 days prior to his upcoming partial/total nephrectomy with plans to restart as soon as he is cleared to do so by his surgeon.  I will route this recommendation to the requesting party via Epic fax function and remove from pre-op pool. Please call with questions.  Abigail Butts, PA-C 11/10/2020, 5:22 PM

## 2020-11-10 NOTE — Telephone Encounter (Signed)
Patient with diagnosis of atrial fibrillation on Eliquis for anticoagulation.    Procedure: partial vs total nephrectomy Date of procedure: TBD ASAP   CHA2DS2-VASc Score = 2   This indicates a 2.2% annual risk of stroke. The patient's score is based upon: CHF History: 0 HTN History: 1 Diabetes History: 0 Stroke History: 0 Vascular Disease History: 0 Age Score: 1 Gender Score: 0   CrCl 73 (with adjusted body weight) Platelet count 199   Per office protocol, patient can hold Eliquis for 3 days prior to procedure.   Patient will not need bridging with Lovenox (enoxaparin) around procedure.

## 2020-11-10 NOTE — Telephone Encounter (Signed)
   [facility] Pre-operative Risk Assessment    Patient Name: Albert Good.  DOB: Oct 02, 1955 MRN: [id number]  HEARTCARE STAFF:  - IMPORTANT!!!!!! Under Visit Info/Reason for Call, type in Other and utilize the format Clearance MM/DD/YY or Clearance TBD. Do not use dashes or single digits. - Please review there is not already an duplicate clearance open for this procedure. - If request is for dental extraction, please clarify the # of teeth to be extracted. - If the patient is currently at the dentist's office, call Pre-Op Callback Staff (MA/nurse) to input urgent request.  - If the patient is not currently in the dentist office, please route to the Pre-Op pool.  Request for surgical clearance:  What type of surgery is being performed? Right robotic assisted laparoscopic partial nephrectomy possible total nephrectomy  When is this surgery scheduled? TBD but needs ASAP  What type of clearance is required (medical clearance vs. Pharmacy clearance to hold med vs. Both)? both  Are there any medications that need to be held prior to surgery and how long? Eliquis for 48-72 hours prior  Practice name and name of physician performing surgery? Dr. Raynelle Bring at [facility]  What is the office phone number? [place]   7.   What is the office fax number? [phone]  8.   Anesthesia type (None, local, MAC, general) ? General    Leah Newnam 11/10/2020, 1:38 PM  _________________________________________________________________   (provider comments below)

## 2020-11-11 ENCOUNTER — Other Ambulatory Visit: Payer: Self-pay

## 2020-11-14 ENCOUNTER — Other Ambulatory Visit: Payer: Self-pay

## 2020-11-19 ENCOUNTER — Other Ambulatory Visit: Payer: Self-pay | Admitting: Urology

## 2020-11-19 ENCOUNTER — Other Ambulatory Visit: Payer: Self-pay | Admitting: Internal Medicine

## 2020-11-19 DIAGNOSIS — M1A09X0 Idiopathic chronic gout, multiple sites, without tophus (tophi): Secondary | ICD-10-CM

## 2020-11-19 MED FILL — Metoprolol Succinate Tab ER 24HR 100 MG (Tartrate Equiv): ORAL | 30 days supply | Qty: 30 | Fill #6 | Status: AC

## 2020-11-19 NOTE — Telephone Encounter (Signed)
Requested medications are due for refill today.  yes  Requested medications are on the active medications list.  yes  Last refill. 08/22/2020  Future visit scheduled.   yes  Notes to clinic.  Failed protocol d/t expired labs.

## 2020-11-20 ENCOUNTER — Other Ambulatory Visit: Payer: Self-pay

## 2020-11-20 MED ORDER — COLCHICINE 0.6 MG PO TABS
0.6000 mg | ORAL_TABLET | Freq: Every day | ORAL | 0 refills | Status: DC
  Filled 2020-11-20: qty 30, 30d supply, fill #0

## 2020-11-21 ENCOUNTER — Other Ambulatory Visit: Payer: Self-pay

## 2020-11-24 ENCOUNTER — Other Ambulatory Visit: Payer: Self-pay

## 2020-11-26 ENCOUNTER — Other Ambulatory Visit: Payer: Self-pay

## 2020-11-26 NOTE — Patient Instructions (Addendum)
DUE TO COVID-19 ONLY ONE VISITOR IS ALLOWED TO COME WITH YOU AND STAY IN THE WAITING ROOM ONLY DURING PRE OP AND PROCEDURE DAY OF SURGERY.   Up to two visitors ages 16+ are allowed at one time in a patient's room.  The visitors may rotate out with other people throughout the day.  Additionally, up to two children between the ages of 69 and 41 are allowed and do not count toward the number of allowed visitors.  Children within this age range must be accompanied by an adult visitor.  One adult visitor may remain with the patient overnight and must be in the room by 8 PM.  YOU NEED TO HAVE A COVID 19 TEST ON__12-8-22_____ between 8am-3pm______, THIS TEST MUST BE DONE BEFORE SURGERY,     Please bring completed form with you to the COVID testing site   [name] TEST IS COMPLETED,  PLEASE Wear a mask when in public           Your procedure is scheduled on: 12-22-20   Report to [facility] Main  Entrance   Report to admitting at      [place]  AM     Call this number if you have problems the morning of surgery [phone]   Remember:  Miralax 17g in 4oz of water at noon day before surgery  Follow a clear liquid diet the day before surgery per MD orders you may have clear liquids until      0430 am then nothing by mouth    CLEAR LIQUID DIET                                                                    water Black Coffee and tea, regular and decaf No Creamer                            Plain Jell-O any favor except red or purple                                  Fruit ices (not with fruit pulp)                                      Iced Popsicles                                                                      Cranberry, grape and apple juices Sports drinks like Gatorade Lightly seasoned clear broth or consume(fat free) Sugar, honey syrup  Sample Menu Breakfast  Lunch                                     Supper Cranberry juice                    Beef broth                            Chicken broth Jell-O                                     Grape juice                           Apple juice Coffee or tea                        Jell-O                                      Popsicle                                                Coffee or tea                        Coffee or tea  _____________________________________________________________________     BRUSH YOUR TEETH MORNING OF SURGERY AND RINSE YOUR MOUTH OUT, NO CHEWING GUM CANDY OR MINTS.     Take these medicines the morning of surgery with A SIP OF WATER: metoprolol, diltiazem, colchicine, allopurinol, Clomiphene                                 You may not have any metal on your body including hair pins and              piercings  Do not wear jewelry, lotions, powders,perfumes,        deodorant                      Men may shave face and neck.   Do not bring valuables to the hospital. [place].  Contacts, dentures or bridgework may not be worn into surgery.  You may bring a small overnight bag with you     Patients discharged the day of surgery will not be allowed to drive home. IF YOU ARE HAVING SURGERY AND GOING HOME THE SAME DAY, YOU MUST HAVE AN ADULT TO DRIVE YOU HOME AND BE WITH YOU FOR 24 HOURS. YOU MAY GO HOME BY TAXI OR UBER OR ORTHERWISE, BUT AN ADULT MUST ACCOMPANY YOU HOME AND STAY WITH YOU FOR 24 HOURS.  Name and phone number of your driver:  Special Instructions: N/A              Please read over the following fact sheets you were given: _____________________________________________________________________  [facility] - Preparing for Surgery Before surgery, you can play an important role.  Because skin is not sterile, your skin needs to be as free of germs as possible.  You can reduce the number of germs on your  skin by washing with CHG (chlorahexidine gluconate) soap before surgery.  CHG is an antiseptic cleaner which kills germs and bonds with the skin to continue killing germs even after washing. Please DO NOT use if you have an allergy to CHG or antibacterial soaps.  If your skin becomes reddened/irritated stop using the CHG and inform your nurse when you arrive at Short Stay. Do not shave (including legs and underarms) for at least 48 hours prior to the first CHG shower.  You may shave your face/neck. Please follow these instructions carefully:  1.  Shower with CHG Soap the night before surgery and the  morning of Surgery.  2.  If you choose to wash your hair, wash your hair first as usual with your  normal  shampoo.  3.  After you shampoo, rinse your hair and body thoroughly to remove the  shampoo.                           4.  Use CHG as you would any other liquid soap.  You can apply chg directly  to the skin and wash                       Gently with a scrungie or clean washcloth.  5.  Apply the CHG Soap to your body ONLY FROM THE NECK DOWN.   Do not use on face/ open                           Wound or open sores. Avoid contact with eyes, ears mouth and genitals (private parts).                       Wash face,  Genitals (private parts) with your normal soap.             6.  Wash thoroughly, paying special attention to the area where your surgery  will be performed.  7.  Thoroughly rinse your body with warm water from the neck down.  8.  DO NOT shower/wash with your normal soap after using and rinsing off  the CHG Soap.                9.  Pat yourself dry with a clean towel.            10.  Wear clean pajamas.            11.  Place clean sheets on your bed the night of your first shower and do not  sleep with pets. Day of Surgery : Do not apply any lotions/deodorants the morning of surgery.  Please wear clean clothes to the hospital/surgery center.  FAILURE TO FOLLOW THESE INSTRUCTIONS MAY  RESULT IN THE CANCELLATION OF YOUR SURGERY PATIENT SIGNATURE_________________________________  NURSE SIGNATURE__________________________________  ________________________________________________________________________

## 2020-11-26 NOTE — Progress Notes (Addendum)
PCP - [facility] Cardiologist - Dorris Carnes, MD Clearance 11-10-20 Roby Lofts, PA-C epic  PPM/ICD -  Device Orders -  Rep Notified -   Chest x-ray - CT chest 09-25-20 epic EKG - 03-12-20 epic Stress Test - 2020 ECHO -  Cardiac Cath -   Sleep Study -  CPAP - yes  Fasting Blood Sugar - 130-150's Checks Blood Sugar _____ times a day  Blood Thinner Instructions:Elequis hold 3 days prior  Stop 12-18-20 Aspirin Instructions:  ERAS Protcol - PRE-SURGERY Ensure or G2-   COVID TEST- 12-18-20 COVID vaccine -pfizer 2 shots  Activity--Able to walk a flight of stairs without SOB Anesthesia review: OSA HTN  Patient denies shortness of breath, fever, cough and chest pain at PAT appointment   All instructions explained to the patient, with a verbal understanding of the material. Patient agrees to go over the instructions while at home for a better understanding. Patient also instructed to self quarantine after being tested for COVID-19. The opportunity to ask questions was provided.

## 2020-11-27 ENCOUNTER — Ambulatory Visit: Payer: Medicare HMO | Attending: Internal Medicine | Admitting: Internal Medicine

## 2020-11-27 ENCOUNTER — Other Ambulatory Visit: Payer: Self-pay

## 2020-11-27 ENCOUNTER — Encounter: Payer: Self-pay | Admitting: Internal Medicine

## 2020-11-27 ENCOUNTER — Encounter (HOSPITAL_COMMUNITY): Payer: Self-pay

## 2020-11-27 ENCOUNTER — Encounter (HOSPITAL_COMMUNITY)
Admission: RE | Admit: 2020-11-27 | Discharge: 2020-11-27 | Disposition: A | Discharge status: Home / Self Care | Payer: Medicare HMO | Source: Ambulatory Visit | Attending: Urology | Admitting: Urology

## 2020-11-27 VITALS — BP 144/91 | HR 74 | Resp 16 | Wt 229.2 lb

## 2020-11-27 DIAGNOSIS — G4733 Obstructive sleep apnea (adult) (pediatric): Secondary | ICD-10-CM | POA: Insufficient documentation

## 2020-11-27 DIAGNOSIS — I480 Paroxysmal atrial fibrillation: Secondary | ICD-10-CM | POA: Insufficient documentation

## 2020-11-27 DIAGNOSIS — N2889 Other specified disorders of kidney and ureter: Secondary | ICD-10-CM

## 2020-11-27 DIAGNOSIS — I10 Essential (primary) hypertension: Secondary | ICD-10-CM | POA: Diagnosis not present

## 2020-11-27 DIAGNOSIS — Z87891 Personal history of nicotine dependence: Secondary | ICD-10-CM | POA: Diagnosis not present

## 2020-11-27 DIAGNOSIS — Z7901 Long term (current) use of anticoagulants: Secondary | ICD-10-CM | POA: Insufficient documentation

## 2020-11-27 DIAGNOSIS — I700 Atherosclerosis of aorta: Secondary | ICD-10-CM | POA: Diagnosis not present

## 2020-11-27 DIAGNOSIS — Z9989 Dependence on other enabling machines and devices: Secondary | ICD-10-CM | POA: Diagnosis not present

## 2020-11-27 DIAGNOSIS — Z23 Encounter for immunization: Secondary | ICD-10-CM | POA: Diagnosis not present

## 2020-11-27 DIAGNOSIS — Z01812 Encounter for preprocedural laboratory examination: Secondary | ICD-10-CM | POA: Insufficient documentation

## 2020-11-27 DIAGNOSIS — D49511 Neoplasm of unspecified behavior of right kidney: Secondary | ICD-10-CM | POA: Diagnosis not present

## 2020-11-27 HISTORY — DX: Cardiac arrhythmia, unspecified: I49.9

## 2020-11-27 LAB — CBC
HCT: 45.5 % (ref 39.0–52.0)
Hemoglobin: 14.4 g/dL (ref 13.0–17.0)
MCH: 27.6 pg (ref 26.0–34.0)
MCHC: 31.6 g/dL (ref 30.0–36.0)
MCV: 87.3 fL (ref 80.0–100.0)
Platelets: 282 10*3/uL (ref 150–400)
RBC: 5.21 MIL/uL (ref 4.22–5.81)
RDW: 14.8 % (ref 11.5–15.5)
WBC: 5.8 10*3/uL (ref 4.0–10.5)
nRBC: 0 % (ref 0.0–0.2)

## 2020-11-27 LAB — BASIC METABOLIC PANEL
Anion gap: 7 (ref 5–15)
BUN: 14 mg/dL (ref 8–23)
CO2: 26 mmol/L (ref 22–32)
Calcium: 8.4 mg/dL — ABNORMAL LOW (ref 8.9–10.3)
Chloride: 106 mmol/L (ref 98–111)
Creatinine, Ser: 1.31 mg/dL — ABNORMAL HIGH (ref 0.61–1.24)
GFR, Estimated: 60 mL/min — ABNORMAL LOW (ref >60)
Glucose, Bld: 93 mg/dL (ref 70–99)
Potassium: 4.4 mmol/L (ref 3.5–5.1)
Sodium: 139 mmol/L (ref 135–145)

## 2020-11-27 MED ORDER — HYDRALAZINE HCL 10 MG PO TABS
10.0000 mg | ORAL_TABLET | Freq: Two times a day (BID) | ORAL | 4 refills | Status: DC
  Filled 2020-11-27: qty 60, 30d supply, fill #0
  Filled 2020-12-27: qty 60, 30d supply, fill #1
  Filled 2021-01-24: qty 60, 30d supply, fill #2
  Filled 2021-01-26: qty 60, 30d supply, fill #0
  Filled 2021-02-24: qty 60, 30d supply, fill #1
  Filled 2021-03-31: qty 60, 30d supply, fill #2

## 2020-11-27 NOTE — Progress Notes (Signed)
Patient ID: Albert Sulton., male    DOB: 05-01-1955  MRN: [id number]  CC: Hypertension   Subjective: Albert Good is a 65 y.o. male who presents for chronic ds management His concerns today include:  Patient with history of PAF on Eliquis, HTN, obesity, BPH, CKD stage II, OSA on CPAP, chronic gout of multiple joints, diverticulosis, tubular adenomatous polyps, + clubbing, ED, testosterone def, elev PSA (bx in [place] around 2018.  Neg)  [facility]:  Patient was seen 10/21/2020 by Dr. Raynelle Bring.  He had CAT scan of the abdomen and pelvis because he was complaining of some soreness in the left lower to mid abdomen.  This revealed a 6 cm cystic lesion in the right lower pole of the kidney.  Concern is for right renal cell carcinoma.  Plan is to get an MRI with and without contrast that is scheduled for tomorrow.  Patient tentatively scheduled for right robotic assisted laparoscopic partial nephrectomy pending the results of the MRI. He was also diagnosed with testosterone deficiency and placed on clomiphene which he states he is taking.  He was continued on sildenafil for ED.  HYPERTENSION/PAF Currently taking: see medication list.  He is on Eliquis, Cardizem, Avapro, metoprolol Med Adherence: [x]  Yes.  Just took meds 1 hr 15 mins ago Medication side effects: []  Yes    [x]  No Adherence with salt restriction: [x]  Yes    []  No Home Monitoring?: [x]  Yes  but only occasionally Monitoring Frequency:  Home BP results range:  SOB? []  Yes    [x]  No Chest Pain?: []  Yes    [x]  No Leg swelling?: []  Yes    [x]  No Headaches?: []  Yes    [x]  No Dizziness? []  Yes    [x]  No Comments: occasional palpitations.  No bruising or bleeding.  Stopped using CPAP after he was having soreness in abdomen and was told that he had a mass on his kidney.  He was afraid that the CPAP may have been causing it.  He had low-dose CT of the chest for lung cancer screening 09/24/2020.  It was negative  for lung nodules but this revealed incidental aortic atherosclerosis .  He is not on statin therapy.   Patient Active Problem List   Diagnosis Date Noted   Need for tetanus, diphtheria, and acellular pertussis (Tdap) vaccine 05/23/2020   Overweight (BMI 25.0-29.9) 01/24/2020   OSA (obstructive sleep apnea) 01/24/2020   History of colon polyps 01/24/2020   Chronic anticoagulation 03/28/2019   Paroxysmal atrial fibrillation ([place]) 03/12/2018   Essential hypertension 03/12/2018   BPH associated with nocturia 03/12/2018   Chronic gout of multiple sites 03/12/2018   Long-term use of aspirin therapy 03/12/2018   Chest pain 03/27/2017     Current Outpatient Medications on File Prior to Visit  Medication Sig Dispense Refill   acetaminophen (TYLENOL) 500 MG tablet Take 1 tablet (500 mg total) by mouth every 6 (six) hours as needed. (Patient taking differently: Take 1,000 mg by mouth every 6 (six) hours as needed for mild pain or headache.) 30 tablet 0   allopurinol (ZYLOPRIM) 300 MG tablet TAKE 1 TABLET (300 MG TOTAL) BY MOUTH DAILY. TO TREAT GOUT 90 tablet 1   apixaban (ELIQUIS) 5 MG TABS tablet Take 1 tablet (5 mg total) by mouth 2 (two) times daily. 60 tablet 0   cholecalciferol (VITAMIN D3) 25 MCG (1000 UNIT) tablet Take 1,000 Units by mouth daily.     clomiPHENE (CLOMID) 50 MG tablet Take  25 mg by mouth daily.     colchicine 0.6 MG tablet Take 1 tablet (0.6 mg total) by mouth daily. 30 tablet 0   diltiazem (CARDIZEM CD) 300 MG 24 hr capsule TAKE 1 CAPSULE (300 MG TOTAL) BY MOUTH DAILY. 90 capsule 3   flecainide (TAMBOCOR) 50 MG tablet TAKE 1 TABLET (50 MG TOTAL) BY MOUTH DAILY. 90 tablet 3   irbesartan (AVAPRO) 300 MG tablet TAKE 1 TABLET (300 MG TOTAL) BY MOUTH DAILY. 90 tablet 3   metoprolol succinate (TOPROL-XL) 100 MG 24 hr tablet TAKE 1 TABLET (100 MG TOTAL) BY MOUTH DAILY. TAKE WITH OR IMMEDIATELY FOLLOWING A MEAL. 90 tablet 3   Multiple Vitamin (MULTIVITAMIN WITH MINERALS) TABS tablet  Take 1 tablet by mouth daily.     promethazine-dextromethorphan (PROMETHAZINE-DM) 6.25-15 MG/5ML syrup Take 5 mLs by mouth 4 (four) times daily as needed for cough. (Patient taking differently: Take 5 mLs by mouth at bedtime.) 118 mL 0   sildenafil (REVATIO) 20 MG tablet Take 20-100 mg by mouth daily as needed for erectile dysfunction.     tamsulosin (FLOMAX) 0.4 MG CAPS capsule TAKE 2 CAPSULES (0.8 MG TOTAL) BY MOUTH AT BEDTIME. 180 capsule 1   No current facility-administered medications on file prior to visit.    Allergies  Allergen Reactions   Other Hives    HAIR DYE    Social History   Socioeconomic History   Marital status: Single    Spouse name: Not on file   Number of children: Not on file   Years of education: Not on file   Highest education level: Not on file  Occupational History   Not on file  Tobacco Use   Smoking status: Former    Years: 30.00    Types: Cigarettes   Smokeless tobacco: Former    Types: Snuff    Quit date: 11/13/2020  Vaping Use   Vaping Use: Never used  Substance and Sexual Activity   Alcohol use: Yes    Comment: 2 every other day   Drug use: Not Currently   Sexual activity: Yes    Birth control/protection: None  Other Topics Concern   Not on file  Social History Narrative   Not on file   Social Determinants of Health   Financial Resource Strain: Medium Risk   Difficulty of Paying Living Expenses: Somewhat hard  Food Insecurity: No Food Insecurity   Worried About Charity fundraiser in the Last Year: Never true   Ran Out of Food in the Last Year: Never true  Transportation Needs: No Transportation Needs   Lack of Transportation (Medical): No   Lack of Transportation (Non-Medical): No  Physical Activity: Insufficiently Active   Days of Exercise per Week: 3 days   Minutes of Exercise per Session: 40 min  Stress: No Stress Concern Present   Feeling of Stress : Not at all  Social Connections: Socially Isolated   Frequency of  Communication with Friends and Family: Twice a week   Frequency of Social Gatherings with Friends and Family: Once a week   Attends Religious Services: Never   Marine scientist or Organizations: No   Attends Music therapist: Never   Marital Status: Never married  Human resources officer Violence: Not At Risk   Fear of Current or Ex-Partner: No   Emotionally Abused: No   Physically Abused: No   Sexually Abused: No    Family History  Problem Relation Age of Onset   Diabetes Mother  Hypertension Father    Diabetes Sister    Diabetes Brother    Heart failure Brother    Kidney failure Brother     Past Surgical History:  Procedure Laterality Date   LIPOSUCTION      ROS: Review of Systems Negative except as stated above  PHYSICAL EXAM: BP (!) 144/91   Pulse 74   Resp 16   Wt 229 lb 3.2 oz (104 kg)   SpO2 96%   BMI 29.43 kg/m   BP 160/93 Physical Exam   General appearance - alert, well appearing, older African-American male and in no distress Mouth - mucous membranes moist, pharynx normal without lesions Neck - supple, no significant adenopathy Chest - clear to auscultation, no wheezes, rales or rhonchi, symmetric air entry Heart - normal rate, regular rhythm, normal S1, S2, no murmurs, rubs, clicks or gallops Extremities - peripheral pulses normal, no pedal edema, no clubbing or cyanosis  CMP Latest Ref Rng & Units 11/27/2020 03/12/2020 08/30/2019  Glucose 70 - 99 mg/dL 93 79 86  BUN 8 - 23 mg/dL 14 14 10   Creatinine 0.61 - 1.24 mg/dL 1.31(H) 1.25 1.26  Sodium 135 - 145 mmol/L 139 138 138  Potassium 3.5 - 5.1 mmol/L 4.4 4.2 4.0  Chloride 98 - 111 mmol/L 106 104 99  CO2 22 - 32 mmol/L 26 26 27   Calcium 8.9 - 10.3 mg/dL 8.4(L) 9.1 9.1   Lipid Panel     Component Value Date/Time   CHOL 161 08/30/2019 0943   TRIG 71 08/30/2019 0943   HDL 65 08/30/2019 0943   CHOLHDL 2.5 08/30/2019 0943   LDLCALC 82 08/30/2019 0943    CBC    Component Value  Date/Time   WBC 5.8 11/27/2020 0805   RBC 5.21 11/27/2020 0805   HGB 14.4 11/27/2020 0805   HGB 14.1 03/12/2020 1533   HCT 45.5 11/27/2020 0805   HCT 40.6 03/12/2020 1533   PLT 282 11/27/2020 0805   PLT 199 03/12/2020 1533   MCV 87.3 11/27/2020 0805   MCV 83 03/12/2020 1533   MCH 27.6 11/27/2020 0805   MCHC 31.6 11/27/2020 0805   RDW 14.8 11/27/2020 0805   RDW 14.3 03/12/2020 1533   LYMPHSABS 2.9 09/28/2018 1000   EOSABS 0.2 09/28/2018 1000   BASOSABS 0.1 09/28/2018 1000    ASSESSMENT AND PLAN: 1. Essential hypertension Not at goal.  He will continue Avapro, metoprolol and diltiazem.  We will add low-dose hydralazine with twice daily dosing.  Advised patient to check blood pressure at least 2-3 times a week and record his readings.  We will have him see the clinical pharmacist in about 3 weeks for blood pressure recheck.  He states he will also be agreeable to getting his flu shot at that time.  He did not want to get the flu shot today. - hydrALAZINE (APRESOLINE) 10 MG tablet; Take 1 tablet (10 mg total) by mouth 2 (two) times daily.  Dispense: 60 tablet; Refill: 4  2. OSA on CPAP Encourage patient to start using his CPAP again.  I told him that his OSA does not have anything to do with the mass in the kidney.  3. PAF (paroxysmal atrial fibrillation) (HCC) Continue Cardizem, metoprolol, Eliquis and flecainide.  He is followed by cardiology.  4. Aortic atherosclerosis ([place]) Incidental finding.  I told him that we should start him on low-dose of cholesterol-lowering medication like atorvastatin.  However he does not have her recent lipid profile or liver panel.  We  will order those today and then go from there. - Lipid panel - Hepatic Function Panel  5. Renal mass Followed by and being addressed by his urologist Dr. Alinda Money.  6. Need for influenza vaccination Patient deferred on getting this done today.  He will come back to see the clinical pharmacist in 2 to 3 weeks for blood  pressure check and states he will get it at that time.   Patient was given the opportunity to ask questions.  Patient verbalized understanding of the plan and was able to repeat key elements of the plan.   Orders Placed This Encounter  Procedures   Lipid panel   Hepatic Function Panel      Requested Prescriptions   Signed Prescriptions Disp Refills   hydrALAZINE (APRESOLINE) 10 MG tablet 60 tablet 4    Sig: Take 1 tablet (10 mg total) by mouth 2 (two) times daily.    Return in about 4 months (around 03/27/2021) for Appt with [facility] in 2 wks for BP check and for flu shot.  Karle Plumber, MD, FACP

## 2020-11-27 NOTE — Patient Instructions (Signed)
Try to check your blood pressure at least twice a week with goal being 130/80 or lower.  Follow-up with clinical pharmacist in 2 weeks for repeat blood pressure check.  Bring your log book with you.  We have added a new blood pressure medication called hydralazine 10 mg twice a day.

## 2020-11-28 ENCOUNTER — Other Ambulatory Visit: Payer: Self-pay | Admitting: Internal Medicine

## 2020-11-28 ENCOUNTER — Other Ambulatory Visit: Payer: Self-pay

## 2020-11-28 ENCOUNTER — Ambulatory Visit
Admission: RE | Admit: 2020-11-28 | Discharge: 2020-11-28 | Disposition: A | Discharge status: Home / Self Care | Payer: Medicare HMO | Source: Ambulatory Visit | Attending: Urology | Admitting: Urology

## 2020-11-28 ENCOUNTER — Telehealth: Payer: Self-pay

## 2020-11-28 DIAGNOSIS — D49511 Neoplasm of unspecified behavior of right kidney: Secondary | ICD-10-CM

## 2020-11-28 DIAGNOSIS — K760 Fatty (change of) liver, not elsewhere classified: Secondary | ICD-10-CM | POA: Diagnosis not present

## 2020-11-28 DIAGNOSIS — N2889 Other specified disorders of kidney and ureter: Secondary | ICD-10-CM | POA: Diagnosis not present

## 2020-11-28 DIAGNOSIS — I700 Atherosclerosis of aorta: Secondary | ICD-10-CM | POA: Diagnosis not present

## 2020-11-28 DIAGNOSIS — N281 Cyst of kidney, acquired: Secondary | ICD-10-CM | POA: Diagnosis not present

## 2020-11-28 DIAGNOSIS — E7800 Pure hypercholesterolemia, unspecified: Secondary | ICD-10-CM

## 2020-11-28 LAB — HEPATIC FUNCTION PANEL
ALT: 14 IU/L (ref 0–44)
AST: 20 IU/L (ref 0–40)
Albumin: 4.5 g/dL (ref 3.8–4.8)
Alkaline Phosphatase: 71 IU/L (ref 44–121)
Bilirubin Total: 0.4 mg/dL (ref 0.0–1.2)
Bilirubin, Direct: 0.11 mg/dL (ref 0.00–0.40)
Total Protein: 6.7 g/dL (ref 6.0–8.5)

## 2020-11-28 LAB — LIPID PANEL
Chol/HDL Ratio: 2.5 ratio (ref 0.0–5.0)
Cholesterol, Total: 162 mg/dL (ref 100–199)
HDL: 65 mg/dL (ref >39)
LDL Chol Calc (NIH): 82 mg/dL (ref 0–99)
Triglycerides: 78 mg/dL (ref 0–149)
VLDL Cholesterol Cal: 15 mg/dL (ref 5–40)

## 2020-11-28 MED ORDER — ATORVASTATIN CALCIUM 10 MG PO TABS
10.0000 mg | ORAL_TABLET | Freq: Every day | ORAL | 3 refills | Status: DC
  Filled 2020-11-28: qty 30, 30d supply, fill #0
  Filled 2020-12-27: qty 30, 30d supply, fill #1
  Filled 2021-01-24: qty 30, 30d supply, fill #2
  Filled 2021-01-26: qty 30, 30d supply, fill #0
  Filled 2021-02-24: qty 30, 30d supply, fill #1

## 2020-11-28 MED ORDER — GADOBENATE DIMEGLUMINE 529 MG/ML IV SOLN
20.0000 mL | Freq: Once | INTRAVENOUS | Status: AC | PRN
  Administered 2020-11-28: 20 mL via INTRAVENOUS

## 2020-11-28 NOTE — Progress Notes (Signed)
Let patient know that his liver function test normal.  LDL cholesterol is 82 with goal being less than 70.  I recommend starting a low-dose of cholesterol-lowering medication called atorvastatin.  I will send the prescription to his pharmacy.  After he has been on the medicine for 1 month, he should return to the lab to have liver function tests rechecked on the medication.  The medicine can sometimes cause cramps.  If he develops any cramps he should let me know.

## 2020-11-28 NOTE — Telephone Encounter (Signed)
Contacted pt to go over lab results pt is aware and doesn't have any questions or concerns 

## 2020-11-29 DIAGNOSIS — G4733 Obstructive sleep apnea (adult) (pediatric): Secondary | ICD-10-CM | POA: Diagnosis not present

## 2020-12-01 DIAGNOSIS — R351 Nocturia: Secondary | ICD-10-CM | POA: Diagnosis not present

## 2020-12-03 NOTE — Anesthesia Preprocedure Evaluation (Addendum)
Anesthesia Evaluation  Patient identified by MRN, date of birth, ID band Patient awake    Reviewed: Allergy & Precautions, NPO status , Patient's Chart, lab work & pertinent test results, reviewed documented beta blocker date and time   History of Anesthesia Complications Negative for: history of anesthetic complications  Airway Mallampati: III  TM Distance: >3 FB Neck ROM: Full    Dental no notable dental hx. (+) Dental Advisory Given   Pulmonary sleep apnea and Continuous Positive Airway Pressure Ventilation , former smoker,    Pulmonary exam normal        Cardiovascular hypertension, Pt. on medications and Pt. on home beta blockers Normal cardiovascular exam+ dysrhythmias   Per cardiology preoperative evaluation 11/10/2020, "Chart reviewed as part of pre-operative protocol coverage. Patient was contacted10/31/2022in reference to pre-operative risk assessment for pending surgery as outlined below. Blue Rickman Jr.was last seen on 3/2/2022by Dr. Harrington Challenger. Since that day, Nichlos Kunzler Jr.has done well from a cardiac standpoint.He remains very active at work. He can easily complete 4 METS without anginal complaints.  Therefore, based on ACC/AHA guidelines, the patient would be at acceptable risk for the planned procedure without further cardiovascular testing."  Pt advised to hold Eliquis 3 days prior to procedure.    Neuro/Psych negative neurological ROS     GI/Hepatic negative GI ROS, Neg liver ROS,   Endo/Other  negative endocrine ROS  Renal/GU negative Renal ROS     Musculoskeletal negative musculoskeletal ROS (+)   Abdominal   Peds  Hematology negative hematology ROS (+)   Anesthesia Other Findings   Reproductive/Obstetrics                           Anesthesia Physical Anesthesia Plan  ASA: 3  Anesthesia Plan: General   Post-op Pain Management: Celebrex PO (pre-op) and  Tylenol PO (pre-op)   Induction: Intravenous  PONV Risk Score and Plan: 4 or greater and Ondansetron, Dexamethasone and Midazolam  Airway Management Planned: Oral ETT  Additional Equipment:   Intra-op Plan:   Post-operative Plan: Extubation in OR  Informed Consent: I have reviewed the patients History and Physical, chart, labs and discussed the procedure including the risks, benefits and alternatives for the proposed anesthesia with the patient or authorized representative who has indicated his/her understanding and acceptance.     Dental advisory given  Plan Discussed with: Anesthesiologist  Anesthesia Plan Comments: (See PAT note 11/27/2020, Konrad Felix Ward, PA-C)      Anesthesia Quick Evaluation

## 2020-12-03 NOTE — Progress Notes (Signed)
Anesthesia Chart Review   Case: [id number] Date/Time: 12/22/20 0715   Procedure: XI ROBOTIC ASSITED PARTIAL NEPHRECTOMY POSSIBLE TOTAL NEPHRECTOMY (Right)   Anesthesia type: General   Pre-op diagnosis: RIGHT RENAL NEOPLASM   Location: [facility] ROOM 03 / [facility]   Surgeons: Raynelle Bring, MD       DISCUSSION:65 y.o. former smoker with h/o HTN, OSA, PAF (on Eliquis), right renal neoplasm scheduled for above procedure 12/22/2020 with Dr. Raynelle Bring.   Per cardiology preoperative evaluation 11/10/2020, "Chart reviewed as part of pre-operative protocol coverage. Patient was contacted 11/10/2020 in reference to pre-operative risk assessment for pending surgery as outlined below.  Abdirahman [place]. was last seen on 03/12/2020 by Dr. Harrington Challenger.  Since that day, Mcdonald Reiling. has done well from a cardiac standpoint.  He remains very active at work.  He can easily complete 4 METS without anginal complaints.   Therefore, based on ACC/AHA guidelines, the patient would be at acceptable risk for the planned procedure without further cardiovascular testing."  Pt advised to hold Eliquis 3 days prior to procedure.   Anticipate pt can proceed with planned procedure barring acute status change.   VS: BP (!) 175/95   Pulse 65   Temp 36.9 C (Oral)   Resp 16   Ht 6\' 2"  (1.88 m)   Wt 101.2 kg   SpO2 97%   BMI 28.63 kg/m   PROVIDERS: Ladell Pier, MD is PCP   Dorris Carnes, MD is Cardiologist  LABS: Labs reviewed: Acceptable for surgery. (all labs ordered are listed, but only abnormal results are displayed)  Labs Reviewed  BASIC METABOLIC PANEL - Abnormal; Notable for the following components:      Result Value   Creatinine, Ser 1.31 (*)    Calcium 8.4 (*)    GFR, Estimated 60 (*)    All other components within normal limits  CBC     IMAGES:   EKG: 03/12/20 Rate 49 bpm  Sinus bradycardia   CV: Stress Test 08/08/2018 Nuclear stress EF: 53%. The left ventricular ejection fraction is  mildly decreased (45-54%). There was no ST segment deviation noted during stress. Baseline nonspecific ST abnormality present. The study is normal. This is a low risk study.  Echo 03/25/2016 Left ventricle cavity is normal in size. Normal global wall motion. Calculated EF 58%.  The aortic root is mildly dilated. Mildly dilated ascending aorta.  No significant valvular abnormalities.  Past Medical History:  Diagnosis Date   BPH associated with nocturia 03/12/2018   BPH with obstruction/lower urinary tract symptoms    Chronic gout of multiple sites 03/12/2018   Dysrhythmia    Afib   Essential hypertension    Gout    Long-term use of aspirin therapy 03/12/2018   OSA (obstructive sleep apnea)    uses Cpap   Paroxysmal atrial fibrillation (HCC)     Past Surgical History:  Procedure Laterality Date   LIPOSUCTION      MEDICATIONS:  acetaminophen (TYLENOL) 500 MG tablet   allopurinol (ZYLOPRIM) 300 MG tablet   apixaban (ELIQUIS) 5 MG TABS tablet   atorvastatin (LIPITOR) 10 MG tablet   cholecalciferol (VITAMIN D3) 25 MCG (1000 UNIT) tablet   clomiPHENE (CLOMID) 50 MG tablet   colchicine 0.6 MG tablet   diltiazem (CARDIZEM CD) 300 MG 24 hr capsule   flecainide (TAMBOCOR) 50 MG tablet   hydrALAZINE (APRESOLINE) 10 MG tablet   irbesartan (AVAPRO) 300 MG tablet   metoprolol succinate (TOPROL-XL) 100 MG 24 hr tablet  Multiple Vitamin (MULTIVITAMIN WITH MINERALS) TABS tablet   promethazine-dextromethorphan (PROMETHAZINE-DM) 6.25-15 MG/5ML syrup   sildenafil (REVATIO) 20 MG tablet   tamsulosin (FLOMAX) 0.4 MG CAPS capsule   No current facility-administered medications for this encounter.    Konrad Felix Ward, PA-C [facility] [phone]

## 2020-12-06 DIAGNOSIS — G4733 Obstructive sleep apnea (adult) (pediatric): Secondary | ICD-10-CM | POA: Diagnosis not present

## 2020-12-10 ENCOUNTER — Other Ambulatory Visit: Payer: Self-pay

## 2020-12-10 ENCOUNTER — Other Ambulatory Visit: Payer: Self-pay | Admitting: Pharmacist

## 2020-12-10 ENCOUNTER — Other Ambulatory Visit: Payer: Self-pay | Admitting: Internal Medicine

## 2020-12-10 MED ORDER — APIXABAN 5 MG PO TABS
5.0000 mg | ORAL_TABLET | Freq: Two times a day (BID) | ORAL | 2 refills | Status: DC
  Filled 2020-12-10: qty 60, 30d supply, fill #0

## 2020-12-10 MED FILL — Irbesartan Tab 300 MG: ORAL | 30 days supply | Qty: 30 | Fill #6 | Status: AC

## 2020-12-11 ENCOUNTER — Other Ambulatory Visit: Payer: Self-pay

## 2020-12-18 ENCOUNTER — Other Ambulatory Visit: Payer: Self-pay | Admitting: Urology

## 2020-12-18 LAB — SARS CORONAVIRUS 2 (TAT 6-24 HRS): SARS Coronavirus 2: NEGATIVE

## 2020-12-19 NOTE — H&P (Signed)
Office Visit Report     12/01/2020   --------------------------------------------------------------------------------   Albert Good  MRN: [id number]  DOB: 30-Jun-1955, 65 year old Male  SSN:    PRIMARY CARE:  Albert Plumber, MD  REFERRING:  Albert Good, Albert Good  PROVIDER:  Harold Good, M.D.  TREATING:  Albert Crocker, NP  LOCATION:  [facility] [phone]     --------------------------------------------------------------------------------   CC/HPI: 12/01/2020: Pt with below noted history. He is scheduled to undergo right robotic assisted laparoscopic nephrectomy with Dr. Alinda Good 12/12. MRI recently performed on 11/18 confirmed with prior CT imaging identified as a multi septated cystic right renal mass, Bosniak 4 lesion highly suspicious for renal cell carcinoma. There is also significant risk for possibly necessitating a total nephrectomy considering the location of his tumor in relationship to the renal hilum. Pt continues Clomid for management of underlying testosterone deficiency. Also utilizing sildenafil for management of underlying erectile dysfunction. He takes tamsulosin 0.8 mg for underlying BPH with obstructive voiding symptoms. Since last office visit he endorses an improvement overall with baseline energy levels, less fatigue and increased ability to exercise on a routine basis in the gym without excessive exercise intolerance. No problems with mood, cognition or memory. Voiding symptoms grossly stable with continued use of tamsulosin. He has had no bothersome increase in frequency/urgency, sensation of incomplete bladder emptying, no interval dysuria or gross hematuria. Urinalysis today is clear. Overall he feels well, no recent chest pain, shortness of breath, lightheadedness or dizziness, or paresthesias. He denies any right-sided pain or discomfort suggestive of obstructive uropathy. He denies any recent fevers or chills, nausea/vomiting.   CC: Right  renal neoplasm  Physician requesting consult: Dr. Irineo Good  Primary care physician: Dr. Karle Good   10/21/2020: Albert Good is a 65 year old gentleman who established care with Dr. Milford Good in January 2022 for BPH and LUTS. His urologic history was significant for a history of prior testosterone replacement therapy that had been stopped about 3 years prior. According to the patient, he had an elevated PSA while on therapy in [place] prompting a prostate biopsy that was reportedly benign. This history is per the patient's report without confirmatory records currently available. His PSA in March 2022 was 5.36. He was started on clomiphene per Dr. Milford Good for TRT in June 2022 with a baseline testosterone level of 276.   He also is treated with sildenafil for ED.   His main LUTS included weak stream and nocturia 5-6 x's per night. He was titrated up to tamsulosin 0.8 mg with effective results. Baseline PVR on this dose has been 150-200 cc.   He was seen for routine follow up in September 2022 and complained of abdominal pain. He underwent a CT scan of the abdomen and pelvis with contrast for evaluation and this demonstrated a 6 cm complex right renal cystic lesion. He did have a CT scan of the chest performed on 09/24/2020 for lung cancer screening considering his prior history of smoking. This was negative for any concerning pulmonary nodules or lesions.   Family history of kidney cancer: None.  Family history of ESRD: No.   Imaging: CT abdomen/pelvis (09/24/20)  Side of renal neoplasm: Right  Size of renal neoplasm: 6.0 cm  Location of renal neoplasm: Right lower pole with extension up to and abutting the renal vein.  Exophytic or endophytic: Exophytic but with extension toward the renal sinus  Renal nephrometry score: 10p   Renal artery anatomy: Single renal  artery  Renal vein anatomy: Single renal vein   Contralateral renal lesions: Simple 5.9 cm left renal cyst  Regional  lymphadenopathy: None.  Adrenal masses: None.  Renal vein/IVC involvement: No.  Metastatic disease to the abdomen: No.   Chest imaging: Negative (CT 09/24/20)  LFTs: Normal.   Baseline renal function: Cr 1.2, eGFR > 60 ml/min   PMH: Past medical history is significant for atrial fibrillation (Eliquis), gout, hypertension, and sleep apnea.  PSH: No abdominal surgeries.     ALLERGIES: No Allergies    MEDICATIONS: Allopurinol 300 mg tablet  Metoprolol Succinate 100 mg tablet, extended release 24 hr  Tamsulosin Hcl 0.4 mg capsule  Cholesterol Med  Clomiphene Citrate 50 mg tablet 1/2 tablet PO Daily  Clomiphene Citrate 50 mg tablet 1/2 tablet PO Daily  Clomiphene Citrate 50 mg tablet  Colchicine 0.6 mg capsule  Diltiazem 12Hr Er  Eliquis 5 mg (74 tabs) tablet, dose pack  Flecainide Acetate 50 mg tablet  Irbesartan 300 mg tablet  Sildenafil Citrate 20 mg tablet Take 1-5 tabs po qd prn ED     GU PSH: Locm 300-399Mg/Ml Iodine,1Ml - 09/23/2020     NON-GU PSH: No Non-GU PSH    GU PMH: Right renal neoplasm - 10/21/2020 Abdominal Pain Unspec - 09/23/2020, - 09/18/2020 ED due to arterial insufficiency - 09/18/2020 BPH w/LUTS - 07/07/2020, - 03/17/2020, - 02/07/2020 Elevated PSA - 07/07/2020, - 02/07/2020 Weak Urinary Stream - 07/07/2020, - 03/17/2020, - 02/07/2020 Nocturia - 02/07/2020    NON-GU PMH: Low testosterone - 09/18/2020, - 07/07/2020, - 03/17/2020 Atrial Fibrillation Gout Hypertension Sleep Apnea    FAMILY HISTORY: 4 sons - Runs in Family 6 sons - Runs in Family    Notes: mother & father deceased   SOCIAL HISTORY: Marital Status: Single Preferred Language: English; Ethnicity: Not Hispanic Or Latino; Race: Black or African American Current Smoking Status: Patient does not smoke anymore.   Tobacco Use Assessment Completed: Used Tobacco in last 30 days? Does not use smokeless tobacco. Does not drink anymore.  Drinks 1 caffeinated drink per day. Has not had a blood transfusion.     REVIEW OF SYSTEMS:    GU Review Male:   Patient reports get up at night to urinate. Patient denies frequent urination, hard to postpone urination, burning/ pain with urination, leakage of urine, stream starts and stops, trouble starting your stream, have to strain to urinate , erection problems, and penile pain.  Gastrointestinal (Upper):   Patient denies nausea, vomiting, and indigestion/ heartburn.  Gastrointestinal (Lower):   Patient denies diarrhea and constipation.  Constitutional:   Patient denies fever, night sweats, weight loss, and fatigue.  Skin:   Patient denies skin rash/ lesion and itching.  Eyes:   Patient denies blurred vision and double vision.  Ears/ Nose/ Throat:   Patient denies sore throat and sinus problems.  Hematologic/Lymphatic:   Patient denies swollen glands and easy bruising.  Cardiovascular:   Patient denies leg swelling and chest pains.  Respiratory:   Patient denies cough and shortness of breath.  Endocrine:   Patient denies excessive thirst.  Musculoskeletal:   Patient denies back pain and joint pain.  Neurological:   Patient denies headaches and dizziness.  Psychologic:   Patient denies depression and anxiety.   VITAL SIGNS:      12/01/2020 02:36 PM  BP 128/76 mmHg  Heart Rate 54 /min  Temperature 97.3 F / 36.2 C   MULTI-SYSTEM PHYSICAL EXAMINATION:    Constitutional: Well-nourished. No physical deformities. Normally  developed. Good grooming.  Neck: Neck symmetrical, not swollen. Normal tracheal position.  Respiratory: No labored breathing, no use of accessory muscles. Clear bilaterally.  Cardiovascular: Normal temperature, normal extremity pulses, no swelling, no varicosities. Irregularly irregular. Normal rate.  Skin: No paleness, no jaundice, no cyanosis. No lesion, no ulcer, no rash.  Neurologic / Psychiatric: Oriented to time, oriented to place, oriented to person. No depression, no anxiety, no agitation.  Gastrointestinal: No mass, no tenderness,  no rigidity, non obese abdomen.  Musculoskeletal: Normal gait and station of head and neck.     Complexity of Data:  Source Of History:  Patient, Medical Record Summary  Lab Test Review:   PSA, CBC with Diff, CMP, Total Testosterone  Records Review:   Previous Doctor Records, Previous Hospital Records, Previous Patient Records  Urine Test Review:   Urinalysis, Urine Culture  X-Ray Review: C.T. Abdomen/Pelvis: Reviewed Films. Reviewed Report.  MRI Abdomen: Reviewed Films. Reviewed Report.     03/17/20  PSA  Total PSA 5.36 ng/mL  Free PSA 1.31 ng/mL  % Free PSA 24 % PSA    07/03/20 03/31/20  Hormones  Testosterone, Total 276.2 ng/dL 302.2 ng/dL   Notes:                     CLINICAL DATA: Cystic right renal mass on prior CT performed for  left lower quadrant pain.  EXAM:  MRI ABDOMEN WITHOUT AND WITH CONTRAST  TECHNIQUE:  Multiplanar multisequence MR imaging of the abdomen was performed  both before and after the administration of intravenous contrast.  CONTRAST: 44m MULTIHANCE GADOBENATE DIMEGLUMINE 529 MG/ML IV SOLN  COMPARISON: CT of 09/23/2020  FINDINGS:  Lower chest: Normal heart size without pericardial or pleural  effusion. Left base scarring.  Hepatobiliary: Tiny right hepatic lobe cyst. No suspicious liver  lesion. Moderate hepatic steatosis. Normal gallbladder, without  biliary ductal dilatation.  Pancreas: Normal, without mass or ductal dilatation.  Spleen: Normal in size, without focal abnormality.  Adrenals/Urinary Tract: Normal adrenal glands. Dominant interpolar  left renal simple cyst of 5.0 cm on 68/14.  Tiny upper pole right renal cyst or cysts.  Corresponding to the CT abnormality, within the medial lower pole  right kidney, is a mass which measures 5.9 x 5.9 by 6.4 cm on 28/3  and 17/4. Demonstrates some areas of mild precontrast T1  hyperintensity including on series 9. After contrast, enhancement  within multiple irregular septa, including at up to 6  mm on 41/16.  Septal irregularity identified on 16/4.  Mild mass-effect with resultant mild upper pole and interpolar  caliectasis including on 30/16.  Stomach/Bowel: Normal stomach and abdominal bowel loops.  [facility]  [address] [place] [id number] [email or web address]- Report exported on Mon, Dec 01, 2020 12:22:05 -0500 - Page 2 of 2  Vascular/Lymphatic: Aortic atherosclerosis. Patent renal veins. No  retroperitoneal or retrocrural adenopathy. No retroperitoneal or  retrocrural adenopathy.  Other: No ascites.  Musculoskeletal: No right psoas involvement identified at the site  of tumor proximity. No acute osseous abnormality.  IMPRESSION:  1. Multi septated cystic right renal mass is considered Bosniak IV  (2019). 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 This  causes mild upper and interpolar right-sided caliectasis.  2. No evidence of abdominal metastatic disease.  3. Hepatic steatosis  4. Aortic Atherosclerosis (ICD10-I70.0).  Electronically Signed  By: KAbigail MiyamotoM.D.  On: 12/01/2020 09:39   PROCEDURES:  Urinalysis Dipstick Dipstick Cont'd  Color: Yellow Bilirubin: Neg mg/dL  Appearance: Clear Ketones: Neg mg/dL  Specific Gravity: 1.015 Blood: Neg ery/uL  pH: <=5.0 Protein: Neg mg/dL  Glucose: Neg mg/dL Urobilinogen: 0.2 mg/dL    Nitrites: Neg    Leukocyte Esterase: Neg leu/uL    ASSESSMENT:      ICD-10 Details  1 GU:   Right renal neoplasm - D49.511 Right, Chronic, Threat to Bodily Function  2   BPH w/LUTS - N40.1 Chronic, Stable  3   Nocturia - R35.1 Chronic, Stable  4 NON-GU:   Low testosterone - E34.9 Chronic, Stable   PLAN:           Orders Labs Urine Culture          Schedule Return Visit/Planned Activity: Keep Scheduled Appointment - Follow up MD, Schedule Surgery          Document Letter(s):  Created for Patient: Clinical Summary         Notes:   All questions  answered to the best of my ability regarding the upcoming procedure and expected postoperative course with understanding expressed by the patient. Precautionary urine culture sent today to serve his baseline testing for his upcoming surgery. He will continue with Dr Albert Good for management of his underlying BPH symptoms, testosterone deficiency, history of elevated PSA and erectile dysfunction remaining on Clomid, tamsulosin and as needed sildenafil. He will proceed with previously scheduled robotic partial nephrectomy on 12/12 with Dr. Alinda Good.        Next Appointment:      Next Appointment: 12/12/2020 07:15 AM    Appointment Type: Laboratory Appointment    Location: [facility] [phone]    Provider: Lab LAB    Reason for Visit: 3 mo total t      * Signed by Albert Crocker, NP on 12/01/20 at 2:58 PM (EST*

## 2020-12-20 ENCOUNTER — Other Ambulatory Visit: Payer: Self-pay | Admitting: Internal Medicine

## 2020-12-20 DIAGNOSIS — M1A09X0 Idiopathic chronic gout, multiple sites, without tophus (tophi): Secondary | ICD-10-CM

## 2020-12-20 MED FILL — Metoprolol Succinate Tab ER 24HR 100 MG (Tartrate Equiv): ORAL | 30 days supply | Qty: 30 | Fill #7 | Status: AC

## 2020-12-20 NOTE — Telephone Encounter (Signed)
Requested medication (s) are due for refill today: yes  Requested medication (s) are on the active medication list: yes  Last refill:  11/20/20  Future visit scheduled: no  Notes to clinic:  overdue lab work   Requested Prescriptions  Pending Prescriptions Disp Refills   colchicine 0.6 MG tablet 30 tablet 0    Sig: Take 1 tablet (0.6 mg total) by mouth daily.     Endocrinology:  Gout Agents Failed - 12/20/2020  1:25 PM      Failed - Uric Acid in normal range and within 360 days    Uric Acid  Date Value Ref Range Status  09/28/2018 3.4 (L) 3.7 - 8.6 mg/dL Final    Comment:               Therapeutic target for gout patients: <6.0          Failed - Cr in normal range and within 360 days    Creatinine, Ser  Date Value Ref Range Status  11/27/2020 1.31 (H) 0.61 - 1.24 mg/dL Final          Passed - Valid encounter within last 12 months    Recent Outpatient Visits           3 weeks ago Essential hypertension   [place], Deborah B, MD   4 months ago Primary hypertension   [place], Jarome Matin, RPH-CPP   5 months ago Primary hypertension   [place], Jarome Matin, RPH-CPP   6 months ago Medicare annual wellness visit, initial   [place], Carroll Sage, FNP   7 months ago Essential hypertension   [place], MD

## 2020-12-22 ENCOUNTER — Other Ambulatory Visit: Payer: Self-pay

## 2020-12-22 ENCOUNTER — Encounter (HOSPITAL_COMMUNITY)
Admission: RE | Disposition: A | Discharge status: Home / Self Care | Payer: Self-pay | Source: Home / Self Care | Attending: Urology

## 2020-12-22 ENCOUNTER — Inpatient Hospital Stay (HOSPITAL_COMMUNITY)
Admission: RE | Admit: 2020-12-22 | Discharge: 2020-12-24 | DRG: 658 | Disposition: A | Discharge status: Home / Self Care | Payer: Medicare HMO | Attending: Urology | Admitting: Urology

## 2020-12-22 ENCOUNTER — Encounter (HOSPITAL_COMMUNITY): Payer: Self-pay | Admitting: Urology

## 2020-12-22 ENCOUNTER — Ambulatory Visit (HOSPITAL_COMMUNITY): Payer: Medicare HMO | Admitting: Certified Registered"

## 2020-12-22 ENCOUNTER — Ambulatory Visit (HOSPITAL_COMMUNITY): Payer: Medicare HMO | Admitting: Physician Assistant

## 2020-12-22 DIAGNOSIS — I10 Essential (primary) hypertension: Secondary | ICD-10-CM | POA: Diagnosis not present

## 2020-12-22 DIAGNOSIS — R3912 Poor urinary stream: Secondary | ICD-10-CM | POA: Diagnosis not present

## 2020-12-22 DIAGNOSIS — M109 Gout, unspecified: Secondary | ICD-10-CM | POA: Diagnosis present

## 2020-12-22 DIAGNOSIS — N401 Enlarged prostate with lower urinary tract symptoms: Secondary | ICD-10-CM | POA: Diagnosis present

## 2020-12-22 DIAGNOSIS — Z87891 Personal history of nicotine dependence: Secondary | ICD-10-CM

## 2020-12-22 DIAGNOSIS — C641 Malignant neoplasm of right kidney, except renal pelvis: Secondary | ICD-10-CM | POA: Diagnosis not present

## 2020-12-22 DIAGNOSIS — Z79899 Other long term (current) drug therapy: Secondary | ICD-10-CM | POA: Diagnosis not present

## 2020-12-22 DIAGNOSIS — I480 Paroxysmal atrial fibrillation: Secondary | ICD-10-CM | POA: Diagnosis not present

## 2020-12-22 DIAGNOSIS — G4730 Sleep apnea, unspecified: Secondary | ICD-10-CM | POA: Diagnosis not present

## 2020-12-22 DIAGNOSIS — R351 Nocturia: Secondary | ICD-10-CM | POA: Diagnosis not present

## 2020-12-22 DIAGNOSIS — D49511 Neoplasm of unspecified behavior of right kidney: Secondary | ICD-10-CM | POA: Diagnosis present

## 2020-12-22 DIAGNOSIS — Z7901 Long term (current) use of anticoagulants: Secondary | ICD-10-CM

## 2020-12-22 DIAGNOSIS — I4891 Unspecified atrial fibrillation: Secondary | ICD-10-CM | POA: Diagnosis not present

## 2020-12-22 DIAGNOSIS — N529 Male erectile dysfunction, unspecified: Secondary | ICD-10-CM | POA: Diagnosis not present

## 2020-12-22 DIAGNOSIS — E291 Testicular hypofunction: Secondary | ICD-10-CM | POA: Diagnosis present

## 2020-12-22 DIAGNOSIS — N2889 Other specified disorders of kidney and ureter: Secondary | ICD-10-CM | POA: Diagnosis not present

## 2020-12-22 HISTORY — PX: ROBOTIC ASSITED PARTIAL NEPHRECTOMY: SHX6087

## 2020-12-22 LAB — BASIC METABOLIC PANEL
Anion gap: 9 (ref 5–15)
BUN: 10 mg/dL (ref 8–23)
CO2: 26 mmol/L (ref 22–32)
Calcium: 8.3 mg/dL — ABNORMAL LOW (ref 8.9–10.3)
Chloride: 104 mmol/L (ref 98–111)
Creatinine, Ser: 1.25 mg/dL — ABNORMAL HIGH (ref 0.61–1.24)
GFR, Estimated: >60 mL/min (ref >60)
Glucose, Bld: 151 mg/dL — ABNORMAL HIGH (ref 70–99)
Potassium: 3.5 mmol/L (ref 3.5–5.1)
Sodium: 139 mmol/L (ref 135–145)

## 2020-12-22 LAB — HEMOGLOBIN AND HEMATOCRIT, BLOOD
HCT: 42.1 % (ref 39.0–52.0)
Hemoglobin: 13.5 g/dL (ref 13.0–17.0)

## 2020-12-22 LAB — TYPE AND SCREEN
ABO/RH(D): B POS
Antibody Screen: NEGATIVE

## 2020-12-22 LAB — ABO/RH: ABO/RH(D): B POS

## 2020-12-22 SURGERY — NEPHRECTOMY, PARTIAL, ROBOT-ASSISTED
Anesthesia: General | Site: Abdomen | Laterality: Right

## 2020-12-22 MED ORDER — SODIUM CHLORIDE (PF) 0.9 % IJ SOLN
INTRAMUSCULAR | Status: AC
  Filled 2020-12-22: qty 20

## 2020-12-22 MED ORDER — COLCHICINE 0.6 MG PO TABS
0.6000 mg | ORAL_TABLET | Freq: Every day | ORAL | Status: DC
  Administered 2020-12-23: 0.6 mg via ORAL
  Filled 2020-12-22 (×2): qty 1

## 2020-12-22 MED ORDER — LACTATED RINGERS IV SOLN
INTRAVENOUS | Status: DC
  Administered 2020-12-22: 1000 mL via INTRAVENOUS

## 2020-12-22 MED ORDER — EPHEDRINE SULFATE-NACL 50-0.9 MG/10ML-% IV SOSY
PREFILLED_SYRINGE | INTRAVENOUS | Status: DC | PRN
  Administered 2020-12-22 (×5): 10 mg via INTRAVENOUS

## 2020-12-22 MED ORDER — DILTIAZEM HCL ER COATED BEADS 180 MG PO CP24
300.0000 mg | ORAL_CAPSULE | Freq: Every day | ORAL | Status: DC
  Administered 2020-12-23 – 2020-12-24 (×2): 300 mg via ORAL
  Filled 2020-12-22 (×2): qty 1

## 2020-12-22 MED ORDER — TAMSULOSIN HCL 0.4 MG PO CAPS
0.4000 mg | ORAL_CAPSULE | Freq: Every day | ORAL | Status: DC
  Administered 2020-12-22 – 2020-12-23 (×2): 0.4 mg via ORAL
  Filled 2020-12-22 (×2): qty 1

## 2020-12-22 MED ORDER — KETAMINE HCL 10 MG/ML IJ SOLN
INTRAMUSCULAR | Status: DC | PRN
  Administered 2020-12-22: 20 mg via INTRAVENOUS
  Administered 2020-12-22 (×2): 10 mg via INTRAVENOUS

## 2020-12-22 MED ORDER — CHLORHEXIDINE GLUCONATE CLOTH 2 % EX PADS: 6.0000 | MEDICATED_PAD | Freq: Every day | CUTANEOUS | Status: DC

## 2020-12-22 MED ORDER — CELECOXIB 200 MG PO CAPS
200.0000 mg | ORAL_CAPSULE | Freq: Once | ORAL | Status: AC
  Administered 2020-12-22: 200 mg via ORAL
  Filled 2020-12-22: qty 1

## 2020-12-22 MED ORDER — DEXAMETHASONE SODIUM PHOSPHATE 10 MG/ML IJ SOLN
INTRAMUSCULAR | Status: AC
  Filled 2020-12-22: qty 1

## 2020-12-22 MED ORDER — PHENYLEPHRINE 40 MCG/ML (10ML) SYRINGE FOR IV PUSH (FOR BLOOD PRESSURE SUPPORT)
PREFILLED_SYRINGE | INTRAVENOUS | Status: AC
  Filled 2020-12-22: qty 10

## 2020-12-22 MED ORDER — MIDAZOLAM HCL 2 MG/2ML IJ SOLN
INTRAMUSCULAR | Status: AC
  Filled 2020-12-22: qty 2

## 2020-12-22 MED ORDER — ACETAMINOPHEN 10 MG/ML IV SOLN
1000.0000 mg | Freq: Four times a day (QID) | INTRAVENOUS | Status: AC
  Administered 2020-12-22 – 2020-12-23 (×2): 1000 mg via INTRAVENOUS
  Filled 2020-12-22: qty 100

## 2020-12-22 MED ORDER — ATORVASTATIN CALCIUM 10 MG PO TABS
10.0000 mg | ORAL_TABLET | Freq: Every day | ORAL | Status: DC
  Administered 2020-12-22 – 2020-12-24 (×3): 10 mg via ORAL
  Filled 2020-12-22 (×3): qty 1

## 2020-12-22 MED ORDER — HYDRALAZINE HCL 10 MG PO TABS
10.0000 mg | ORAL_TABLET | Freq: Two times a day (BID) | ORAL | Status: DC
  Administered 2020-12-22 – 2020-12-24 (×4): 10 mg via ORAL
  Filled 2020-12-22 (×4): qty 1

## 2020-12-22 MED ORDER — POLYETHYLENE GLYCOL 3350 17 G PO PACK: 17.0000 g | PACK | Freq: Every day | ORAL | Status: DC

## 2020-12-22 MED ORDER — TRAMADOL HCL 50 MG PO TABS
50.0000 mg | ORAL_TABLET | Freq: Four times a day (QID) | ORAL | 0 refills | Status: DC | PRN
  Filled 2020-12-22: qty 20, 3d supply, fill #0

## 2020-12-22 MED ORDER — DEXTROSE-NACL 5-0.45 % IV SOLN: INTRAVENOUS | Status: DC

## 2020-12-22 MED ORDER — FENTANYL CITRATE (PF) 250 MCG/5ML IJ SOLN
INTRAMUSCULAR | Status: AC
  Filled 2020-12-22: qty 5

## 2020-12-22 MED ORDER — PROPOFOL 10 MG/ML IV BOLUS
INTRAVENOUS | Status: AC
  Filled 2020-12-22: qty 20

## 2020-12-22 MED ORDER — DIPHENHYDRAMINE HCL 50 MG/ML IJ SOLN: 12.5000 mg | Freq: Four times a day (QID) | INTRAMUSCULAR | Status: DC | PRN

## 2020-12-22 MED ORDER — CEFAZOLIN SODIUM-DEXTROSE 2-4 GM/100ML-% IV SOLN
2.0000 g | Freq: Once | INTRAVENOUS | Status: AC
  Administered 2020-12-22: 2 g via INTRAVENOUS
  Filled 2020-12-22: qty 100

## 2020-12-22 MED ORDER — FENTANYL CITRATE (PF) 250 MCG/5ML IJ SOLN
INTRAMUSCULAR | Status: DC | PRN
  Administered 2020-12-22 (×3): 50 ug via INTRAVENOUS
  Administered 2020-12-22: 100 ug via INTRAVENOUS

## 2020-12-22 MED ORDER — SUGAMMADEX SODIUM 200 MG/2ML IV SOLN
INTRAVENOUS | Status: DC | PRN
  Administered 2020-12-22: 200 mg via INTRAVENOUS

## 2020-12-22 MED ORDER — MIDAZOLAM HCL 5 MG/5ML IJ SOLN
INTRAMUSCULAR | Status: DC | PRN
  Administered 2020-12-22: 2 mg via INTRAVENOUS

## 2020-12-22 MED ORDER — ROCURONIUM BROMIDE 10 MG/ML (PF) SYRINGE
PREFILLED_SYRINGE | INTRAVENOUS | Status: AC
  Filled 2020-12-22: qty 10

## 2020-12-22 MED ORDER — STERILE WATER FOR IRRIGATION IR SOLN
Status: DC | PRN
  Administered 2020-12-22: 1000 mL

## 2020-12-22 MED ORDER — BUPIVACAINE LIPOSOME 1.3 % IJ SUSP
INTRAMUSCULAR | Status: DC | PRN
  Administered 2020-12-22: 20 mL

## 2020-12-22 MED ORDER — LIDOCAINE 2% (20 MG/ML) 5 ML SYRINGE
INTRAMUSCULAR | Status: DC | PRN
  Administered 2020-12-22: 100 mg via INTRAVENOUS

## 2020-12-22 MED ORDER — DOCUSATE SODIUM 100 MG PO CAPS
100.0000 mg | ORAL_CAPSULE | Freq: Two times a day (BID) | ORAL | Status: DC
  Administered 2020-12-22 – 2020-12-24 (×4): 100 mg via ORAL
  Filled 2020-12-22 (×4): qty 1

## 2020-12-22 MED ORDER — DEXAMETHASONE SODIUM PHOSPHATE 10 MG/ML IJ SOLN
INTRAMUSCULAR | Status: DC | PRN
  Administered 2020-12-22: 10 mg via INTRAVENOUS

## 2020-12-22 MED ORDER — CHLORHEXIDINE GLUCONATE 0.12 % MT SOLN: 15.0000 mL | Freq: Once | OROMUCOSAL | Status: AC

## 2020-12-22 MED ORDER — BUPIVACAINE LIPOSOME 1.3 % IJ SUSP
INTRAMUSCULAR | Status: AC
  Filled 2020-12-22: qty 20

## 2020-12-22 MED ORDER — FENTANYL CITRATE PF 50 MCG/ML IJ SOSY
25.0000 ug | PREFILLED_SYRINGE | INTRAMUSCULAR | Status: DC | PRN
  Administered 2020-12-22: 50 ug via INTRAVENOUS

## 2020-12-22 MED ORDER — ORAL CARE MOUTH RINSE
15.0000 mL | Freq: Once | OROMUCOSAL | Status: AC
  Administered 2020-12-22: 15 mL via OROMUCOSAL

## 2020-12-22 MED ORDER — ONDANSETRON HCL 4 MG/2ML IJ SOLN
4.0000 mg | INTRAMUSCULAR | Status: DC | PRN
  Administered 2020-12-22: 4 mg via INTRAVENOUS
  Filled 2020-12-22: qty 2

## 2020-12-22 MED ORDER — LACTATED RINGERS IR SOLN
Status: DC | PRN
  Administered 2020-12-22: 1000 mL

## 2020-12-22 MED ORDER — AMISULPRIDE (ANTIEMETIC) 5 MG/2ML IV SOLN: 10.0000 mg | Freq: Once | INTRAVENOUS | Status: DC | PRN

## 2020-12-22 MED ORDER — KETAMINE HCL-SODIUM CHLORIDE 100-0.9 MG/10ML-% IV SOSY
PREFILLED_SYRINGE | INTRAVENOUS | Status: AC
  Filled 2020-12-22: qty 10

## 2020-12-22 MED ORDER — DIPHENHYDRAMINE HCL 12.5 MG/5ML PO ELIX: 12.5000 mg | ORAL_SOLUTION | Freq: Four times a day (QID) | ORAL | Status: DC | PRN

## 2020-12-22 MED ORDER — HYDROMORPHONE HCL 1 MG/ML IJ SOLN
0.5000 mg | INTRAMUSCULAR | Status: DC | PRN
  Administered 2020-12-22 – 2020-12-23 (×4): 1 mg via INTRAVENOUS
  Filled 2020-12-22 (×4): qty 1

## 2020-12-22 MED ORDER — LIDOCAINE HCL (PF) 2 % IJ SOLN
INTRAMUSCULAR | Status: AC
  Filled 2020-12-22: qty 10

## 2020-12-22 MED ORDER — FLECAINIDE ACETATE 50 MG PO TABS
50.0000 mg | ORAL_TABLET | Freq: Every day | ORAL | Status: DC
  Administered 2020-12-22 – 2020-12-24 (×3): 50 mg via ORAL
  Filled 2020-12-22 (×3): qty 1

## 2020-12-22 MED ORDER — ONDANSETRON HCL 4 MG/2ML IJ SOLN
INTRAMUSCULAR | Status: DC | PRN
  Administered 2020-12-22: 4 mg via INTRAVENOUS

## 2020-12-22 MED ORDER — METOPROLOL SUCCINATE ER 100 MG PO TB24
100.0000 mg | ORAL_TABLET | Freq: Every day | ORAL | Status: DC
  Administered 2020-12-23 – 2020-12-24 (×2): 100 mg via ORAL
  Filled 2020-12-22 (×2): qty 1

## 2020-12-22 MED ORDER — FENTANYL CITRATE PF 50 MCG/ML IJ SOSY
PREFILLED_SYRINGE | INTRAMUSCULAR | Status: AC
  Filled 2020-12-22: qty 3

## 2020-12-22 MED ORDER — EPHEDRINE 5 MG/ML INJ
INTRAVENOUS | Status: AC
  Filled 2020-12-22: qty 5

## 2020-12-22 MED ORDER — ALLOPURINOL 300 MG PO TABS
300.0000 mg | ORAL_TABLET | Freq: Every day | ORAL | Status: DC
  Administered 2020-12-23 – 2020-12-24 (×2): 300 mg via ORAL
  Filled 2020-12-22 (×2): qty 1

## 2020-12-22 MED ORDER — ROCURONIUM BROMIDE 10 MG/ML (PF) SYRINGE
PREFILLED_SYRINGE | INTRAVENOUS | Status: DC | PRN
  Administered 2020-12-22: 20 mg via INTRAVENOUS
  Administered 2020-12-22: 100 mg via INTRAVENOUS
  Administered 2020-12-22: 30 mg via INTRAVENOUS

## 2020-12-22 MED ORDER — DOCUSATE SODIUM 100 MG PO CAPS: 100.0000 mg | ORAL_CAPSULE | Freq: Two times a day (BID) | ORAL | Status: DC

## 2020-12-22 MED ORDER — IRBESARTAN 300 MG PO TABS
300.0000 mg | ORAL_TABLET | Freq: Every day | ORAL | Status: DC
  Administered 2020-12-22 – 2020-12-23 (×2): 300 mg via ORAL
  Filled 2020-12-22 (×2): qty 1

## 2020-12-22 MED ORDER — CLOMIPHENE CITRATE 50 MG PO TABS
25.0000 mg | ORAL_TABLET | Freq: Every day | ORAL | Status: DC
  Administered 2020-12-23: 25 mg via ORAL
  Filled 2020-12-22: qty 1

## 2020-12-22 MED ORDER — CEFAZOLIN SODIUM-DEXTROSE 1-4 GM/50ML-% IV SOLN
1.0000 g | Freq: Three times a day (TID) | INTRAVENOUS | Status: AC
  Administered 2020-12-22 – 2020-12-23 (×2): 1 g via INTRAVENOUS
  Filled 2020-12-22 (×2): qty 50

## 2020-12-22 MED ORDER — PROMETHAZINE HCL 25 MG/ML IJ SOLN: 6.2500 mg | INTRAMUSCULAR | Status: DC | PRN

## 2020-12-22 MED ORDER — PROPOFOL 10 MG/ML IV BOLUS
INTRAVENOUS | Status: DC | PRN
  Administered 2020-12-22: 170 mg via INTRAVENOUS

## 2020-12-22 MED ORDER — LIDOCAINE HCL (PF) 2 % IJ SOLN
INTRAMUSCULAR | Status: DC | PRN
  Administered 2020-12-22: 1.5 mg/kg/h via INTRADERMAL

## 2020-12-22 SURGICAL SUPPLY — 61 items
APPLICATOR SURGIFLO ENDO (HEMOSTASIS) IMPLANT
BAG COUNTER SPONGE SURGICOUNT (BAG) IMPLANT
BAG LAPAROSCOPIC 12 15 PORT 16 (BASKET) IMPLANT
BAG RETRIEVAL 12/15 (BASKET) ×2
BAG RETRIEVAL 12/15MM (BASKET) ×1
BAG SURGICOUNT SPONGE COUNTING (BAG)
CHLORAPREP W/TINT 26 (MISCELLANEOUS) ×3 IMPLANT
CLIP LIGATING HEM O LOK PURPLE (MISCELLANEOUS) ×7 IMPLANT
CLIP LIGATING HEMO O LOK GREEN (MISCELLANEOUS) ×6 IMPLANT
COVER SURGICAL LIGHT HANDLE (MISCELLANEOUS) ×3 IMPLANT
COVER TIP SHEARS 8 DVNC (MISCELLANEOUS) ×1 IMPLANT
COVER TIP SHEARS 8MM DA VINCI (MISCELLANEOUS) ×2
CUTTER ECHEON FLEX ENDO 45 340 (ENDOMECHANICALS) ×2 IMPLANT
DECANTER SPIKE VIAL GLASS SM (MISCELLANEOUS) ×3 IMPLANT
DERMABOND ADVANCED (GAUZE/BANDAGES/DRESSINGS) ×2
DERMABOND ADVANCED .7 DNX12 (GAUZE/BANDAGES/DRESSINGS) ×2 IMPLANT
DRAIN CHANNEL 15F RND FF 3/16 (WOUND CARE) ×3 IMPLANT
DRAPE ARM DVNC X/XI (DISPOSABLE) ×4 IMPLANT
DRAPE COLUMN DVNC XI (DISPOSABLE) ×1 IMPLANT
DRAPE DA VINCI XI ARM (DISPOSABLE) ×8
DRAPE DA VINCI XI COLUMN (DISPOSABLE) ×2
DRAPE INCISE IOBAN 66X45 STRL (DRAPES) ×3 IMPLANT
DRAPE SHEET LG 3/4 BI-LAMINATE (DRAPES) ×3 IMPLANT
ELECT PENCIL ROCKER SW 15FT (MISCELLANEOUS) ×3 IMPLANT
ELECT REM PT RETURN 15FT ADLT (MISCELLANEOUS) ×3 IMPLANT
EVACUATOR SILICONE 100CC (DRAIN) ×3 IMPLANT
GLOVE SURG ENC MOIS LTX SZ6.5 (GLOVE) ×3 IMPLANT
GLOVE SURG ENC TEXT LTX SZ7.5 (GLOVE) ×6 IMPLANT
GOWN STRL REUS W/TWL LRG LVL3 (GOWN DISPOSABLE) ×9 IMPLANT
HEMOSTAT SURGICEL 4X8 (HEMOSTASIS) IMPLANT
HOLDER FOLEY CATH W/STRAP (MISCELLANEOUS) ×3 IMPLANT
IRRIG SUCT STRYKERFLOW 2 WTIP (MISCELLANEOUS) ×3
IRRIGATION SUCT STRKRFLW 2 WTP (MISCELLANEOUS) ×1 IMPLANT
KIT BASIN OR (CUSTOM PROCEDURE TRAY) ×3 IMPLANT
KIT TURNOVER KIT A (KITS) ×2 IMPLANT
POUCH SPECIMEN RETRIEVAL 10MM (ENDOMECHANICALS) ×3 IMPLANT
PROTECTOR NERVE ULNAR (MISCELLANEOUS) ×6 IMPLANT
RELOAD STAPLE 45 2.6 WHT THIN (STAPLE) IMPLANT
SEAL CANN UNIV 5-8 DVNC XI (MISCELLANEOUS) ×4 IMPLANT
SEAL XI 5MM-8MM UNIVERSAL (MISCELLANEOUS) ×8
SET TUBE SMOKE EVAC HIGH FLOW (TUBING) ×3 IMPLANT
SOLUTION ELECTROLUBE (MISCELLANEOUS) ×3 IMPLANT
STAPLE RELOAD 45 WHT (STAPLE) ×1 IMPLANT
STAPLE RELOAD 45MM WHITE (STAPLE) ×2
SURGIFLO W/THROMBIN 8M KIT (HEMOSTASIS) ×3 IMPLANT
SUT ETHILON 3 0 PS 1 (SUTURE) ×3 IMPLANT
SUT MNCRL AB 4-0 PS2 18 (SUTURE) ×6 IMPLANT
SUT PDS PLUS 0 (SUTURE) ×4
SUT PDS PLUS AB 0 CT-2 (SUTURE) ×2 IMPLANT
SUT V-LOC BARB 180 2/0GR6 GS22 (SUTURE) ×3
SUT VIC AB 0 CT1 27 (SUTURE) ×2
SUT VIC AB 0 CT1 27XBRD ANTBC (SUTURE) ×1 IMPLANT
SUT VLOC BARB 180 ABS3/0GR12 (SUTURE) ×3
SUTURE V-LC BRB 180 2/0GR6GS22 (SUTURE) ×1 IMPLANT
SUTURE VLOC BRB 180 ABS3/0GR12 (SUTURE) ×1 IMPLANT
TOWEL OR 17X26 10 PK STRL BLUE (TOWEL DISPOSABLE) ×3 IMPLANT
TOWEL OR NON WOVEN STRL DISP B (DISPOSABLE) ×3 IMPLANT
TRAY FOLEY MTR SLVR 16FR STAT (SET/KITS/TRAYS/PACK) ×3 IMPLANT
TRAY LAPAROSCOPIC (CUSTOM PROCEDURE TRAY) ×3 IMPLANT
TROCAR XCEL 12X100 BLDLESS (ENDOMECHANICALS) ×3 IMPLANT
WATER STERILE IRR 1000ML POUR (IV SOLUTION) ×3 IMPLANT

## 2020-12-22 NOTE — Discharge Instructions (Signed)
Activity:  You are encouraged to ambulate frequently (about every hour during waking hours) to help prevent blood clots from forming in your legs or lungs.  However, you should not engage in any heavy lifting (> 10-15 lbs), strenuous activity, or straining. Diet: You should advance your diet as instructed by your physician.  It will be normal to have some bloating, nausea, and abdominal discomfort intermittently. Prescriptions:  You will be provided a prescription for pain medication to take as needed.  If your pain is not severe enough to require the prescription pain medication, you may take extra strength Tylenol instead which will have less side effects.  You should also take a prescribed stool softener to avoid straining with bowel movements as the prescription pain medication may constipate you. Incisions: You may remove your dressing bandages 48 hours after surgery if not removed in the hospital.  You will either have some small staples or special tissue glue at each of the incision sites. Once the bandages are removed (if present), the incisions may stay open to air.  You may start showering (but not soaking or bathing in water) the 2nd day after surgery and the incisions simply need to be patted dry after the shower.  No additional care is needed. What to call us about: You should call the office [phone]) if you develop fever > 101 or develop persistent vomiting.   You may resume aspirin, advil, aleve, vitamins, and supplements 7 days after surgery. You may resume Eliquis 3 days after surgery.

## 2020-12-22 NOTE — Op Note (Signed)
Preoperative diagnosis: Right renal neoplasm  Postoperative diagnosis: Right renal neoplasm  Procedure:  Right robotic-assisted laparoscopic radical nephrectomy  Surgeon: Pryor Curia. M.D.   Assistant(s): Debbrah Alar, PA-C  An assistant was required for this surgical procedure.  The duties of the assistant included but were not limited to suctioning, passing suture, camera manipulation, retraction. This procedure would not be able to be performed without an Environmental consultant.   Resident: Dr. Aldine Contes  Anesthesia: General   Complications: None  EBL: 50  IVF: 2000 mL crystalloid   Specimens:  Right kidney  Disposition of specimens: Pathology   Drains:  18 Fr Foley catheter  Indication:   Albert Good. is a 65 y.o. patient with a 6 cm Bosniak IV right complex renal cyst concerning for malignancy. After a thorough review of the management options, he elected to proceed with surgical treatment and the above procedure. We have discussed the potential benefits and risks of the procedure, side effects of the proposed treatment, the likelihood of the patient achieving the goals of the procedure, and any potential problems that might occur during the procedure or recuperation. Informed consent has been obtained.   Description of procedure:   The patient was taken to the operating room and a general anesthetic was administered. The patient was given preoperative antibiotics, placed in the right modified flank position with care to pad all potential pressure points, and prepped and draped in the usual sterile fashion. Next a preoperative timeout was performed.   A site was selected in the upper midline. This was placed using a standard open Hassan technique which allowed entry into the peritoneal cavity under direct vision and without difficulty. A 12 mm port was placed and a pneumoperitoneum established. The camera was then used to inspect the abdomen and there was no evidence  of any intra-abdominal injuries or other abnormalities. The remaining abdominal ports were then placed. An 8 mm robotic port was placed for the camera just to the right of the umbilicus.  Additional 8 mm robotic ports were placed in the right upper quadrant, right lower quadrant, and far right lateral abdominal wall. All ports were placed under direct vision without difficulty.   Utilizing the cautery scissors, the white line of Toldt was incised allowing the colon to be mobilized medially and the plane between the mesocolon and the anterior layer of Gerota's fascia to be developed and the kidney to be exposed. The ureter and gonadal vein were identified inferiorly and the ureter was lifted anteriorly off the psoas muscle. Dissection proceeded superiorly along the gonadal vein until it entered the inferior vena cava. The renal hilum was then encountered. There was a complex renal hilum with two renal veins noted anteriorly and posteriorly.  In between the two veins, there were two arteries including a posterior artery and a more anterior trifurcating renal artery.  Gerota's fascia was then intentionally entered down to the renal capsule to expose the lower pole of the renal capsule.  The renal mass was identified.  During dissection, part of the cystic mass was inadvertently entered resulting in cyst fluid spillage. Further exposure of the renal mass indicated that it would not be possible to resect the mass away from the renal pelvis and renal vein.  It was therefore decided to proceed with a radical nephrectomy.  The renal arteries were each ligated with multiple renal arteries and divided.  The anterior renal vein was ligated and divided in a similar fashion.  The remaining renal  vein was isolated and stapled and divided with a Flex Echelon stapler 45 mm. The upper and lateral attachments to the kidney were then divided allowing the kidney to be freely mobile. Dissection then proceeded inferiorly along the  ureter and the ureter was divided between clips.  At this point attention returned to the renal fossa. Hemostasis was ensured.  The specimen was placed in a 10 mm specimen bag. The 12 mm upper midline incision was opened to allow removal of the specimen.  This fascial incision extraction site was closed with two running # 1 PDS sutures. All other laparoscopic/robotic ports were removed under direct vision and the pneumoperitoneum let down with inspection of the operative field performed and hemostasis again confirmed. All incision sites were then injected with local anesthetic and reapproximated at the skin level with 4-0 monocryl subcuticular closures. Dermabond was applied to the skin. The patient tolerated the procedure well and without complications. The patient was able to be extubated and transferred to the recovery unit in satisfactory condition.    Pryor Curia MD

## 2020-12-22 NOTE — Plan of Care (Signed)
  Problem: Education: Goal: Knowledge of General Education information will improve Description: Including pain rating scale, medication(s)/side effects and non-pharmacologic comfort measures Outcome: Progressing   Problem: Clinical Measurements: Goal: Ability to maintain clinical measurements within normal limits will improve Outcome: Progressing   Problem: Activity: Goal: Risk for activity intolerance will decrease Outcome: Progressing   Problem: Nutrition: Goal: Adequate nutrition will be maintained Outcome: Progressing   Problem: Elimination: Goal: Will not experience complications related to urinary retention Outcome: Progressing   Problem: Pain Managment: Goal: General experience of comfort will improve Outcome: Progressing   Problem: Education: Goal: Required Educational Video(s) Outcome: Progressing   Problem: Skin Integrity: Goal: Demonstration of wound healing without infection will improve Outcome: Progressing

## 2020-12-22 NOTE — Interval H&P Note (Signed)
History and Physical Interval Note:  12/22/2020 6:56 AM  Albert Good Cindy Hazy.  has presented today for surgery, with the diagnosis of RIGHT RENAL NEOPLASM.  The various methods of treatment have been discussed with the patient and family. After consideration of risks, benefits and other options for treatment, the patient has consented to  Procedure(s): XI ROBOTIC ASSITED PARTIAL NEPHRECTOMY POSSIBLE TOTAL NEPHRECTOMY (Right) as a surgical intervention.  The patient's history has been reviewed, patient examined, no change in status, stable for surgery.  I have reviewed the patient's chart and labs.  Questions were answered to the patient's satisfaction.     [employer]

## 2020-12-22 NOTE — Transfer of Care (Signed)
Immediate Anesthesia Transfer of Care Note  Patient: Albert Good.  Procedure(s) Performed: XI ROBOTIC ASSITED  NEPHRECTOMY (Right: Abdomen)  Patient Location: PACU  Anesthesia Type:General  Level of Consciousness: drowsy  Airway & Oxygen Therapy: Patient Spontanous Breathing and Patient connected to face mask oxygen  Post-op Assessment: Report given to RN and Post -op Vital signs reviewed and stable  Post vital signs: Reviewed and stable  Last Vitals:  Vitals Value Taken Time  BP 143/75 12/22/20 1107  Temp    Pulse 58 12/22/20 1112  Resp    SpO2 97 % 12/22/20 1112  Vitals shown include unvalidated device data.  Last Pain:  Vitals:   12/22/20 0550  TempSrc: Oral         Complications: No notable events documented.

## 2020-12-22 NOTE — Progress Notes (Signed)
Patient ID: Albert Nan., male   DOB: Dec 02, 1955, 65 y.o.   MRN: [id number]   Post-op note  Subjective: The patient is doing well.  No complaints.  Objective: Vital signs in last 24 hours: Temp:  [97.5 F (36.4 C)-98.4 F (36.9 C)] 97.5 F (36.4 C) (12/12 1312) Pulse Rate:  [53-79] 62 (12/12 1312) Resp:  [10-19] 18 (12/12 1312) BP: (143-180)/(75-104) 180/104 (12/12 1312) SpO2:  [92 %-100 %] 97 % (12/12 1312) Weight:  [99.8 kg-101.5 kg] 101.5 kg (12/12 1317)  Intake/Output from previous day: No intake/output data recorded. Intake/Output this shift: Total I/O In: 3000 [I.V.:3000] Out: 2050 [Urine:2000; Blood:50]  Physical Exam:  General: Alert and oriented. Abdomen: Soft, Nondistended. Incisions: Clean and dry. GU: Urine clear.  Lab Results: Recent Labs    12/22/20 1134  HGB 13.5  HCT 42.1    Assessment/Plan: POD#0   1) Continue to monitor, ambulate, IS   Albert Good. MD   LOS: 0 days   Albert Good 12/22/2020, 3:53 PM

## 2020-12-22 NOTE — Anesthesia Procedure Notes (Signed)
Procedure Name: Intubation Date/Time: 12/22/2020 7:43 AM Performed by: Myna Bright, CRNA Pre-anesthesia Checklist: Patient identified, Emergency Drugs available, Suction available and Patient being monitored Patient Re-evaluated:Patient Re-evaluated prior to induction Oxygen Delivery Method: Circle system utilized Preoxygenation: Pre-oxygenation with 100% oxygen Induction Type: IV induction Ventilation: Mask ventilation without difficulty Laryngoscope Size: Mac and 4 Grade View: Grade III Tube type: Oral Tube size: 7.5 mm Number of attempts: 1 Airway Equipment and Method: Stylet Placement Confirmation: ETT inserted through vocal cords under direct vision, breath sounds checked- equal and bilateral and positive ETCO2 Secured at: 22 cm Tube secured with: Tape Dental Injury: Teeth and Oropharynx as per pre-operative assessment  Difficulty Due To: Difficulty was anticipated, Difficult Airway- due to large tongue and Difficult Airway- due to anterior larynx Comments: Easy mask airway. DL x 1 with MAC 4. View of epiglottis only. ETT placed without difficulty. +EtCO2, +BBS. VSS. Glidescope candidate in future.

## 2020-12-22 NOTE — Anesthesia Postprocedure Evaluation (Signed)
Anesthesia Post Note  Patient: Albert Good.  Procedure(s) Performed: XI ROBOTIC ASSITED  NEPHRECTOMY (Right: Abdomen)     Patient location during evaluation: PACU Anesthesia Type: General Level of consciousness: sedated Pain management: pain level controlled Vital Signs Assessment: post-procedure vital signs reviewed and stable Respiratory status: spontaneous breathing and respiratory function stable Cardiovascular status: stable Postop Assessment: no apparent nausea or vomiting Anesthetic complications: no   No notable events documented.  Last Vitals:  Vitals:   12/22/20 1145 12/22/20 1200  BP: (!) 151/90 (!) 162/96  Pulse: (!) 53 (!) 55  Resp: 15 19  Temp:    SpO2: 100% 97%    Last Pain:  Vitals:   12/22/20 1200  TempSrc:   PainSc: [place]

## 2020-12-23 ENCOUNTER — Encounter (HOSPITAL_COMMUNITY): Payer: Self-pay | Admitting: Urology

## 2020-12-23 ENCOUNTER — Other Ambulatory Visit: Payer: Self-pay

## 2020-12-23 LAB — BASIC METABOLIC PANEL
Anion gap: 10 (ref 5–15)
Anion gap: 9 (ref 5–15)
BUN: 16 mg/dL (ref 8–23)
BUN: 18 mg/dL (ref 8–23)
CO2: 26 mmol/L (ref 22–32)
CO2: 26 mmol/L (ref 22–32)
Calcium: 8.5 mg/dL — ABNORMAL LOW (ref 8.9–10.3)
Calcium: 8.1 mg/dL — ABNORMAL LOW (ref 8.9–10.3)
Chloride: 99 mmol/L (ref 98–111)
Chloride: 99 mmol/L (ref 98–111)
Creatinine, Ser: 2.08 mg/dL — ABNORMAL HIGH (ref 0.61–1.24)
Creatinine, Ser: 2.37 mg/dL — ABNORMAL HIGH (ref 0.61–1.24)
GFR, Estimated: 34 mL/min — ABNORMAL LOW (ref >60)
GFR, Estimated: 29 mL/min — ABNORMAL LOW (ref >60)
Glucose, Bld: 168 mg/dL — ABNORMAL HIGH (ref 70–99)
Glucose, Bld: 158 mg/dL — ABNORMAL HIGH (ref 70–99)
Potassium: 4.6 mmol/L (ref 3.5–5.1)
Potassium: 3.7 mmol/L (ref 3.5–5.1)
Sodium: 135 mmol/L (ref 135–145)
Sodium: 134 mmol/L — ABNORMAL LOW (ref 135–145)

## 2020-12-23 LAB — HEMOGLOBIN AND HEMATOCRIT, BLOOD
HCT: 42.7 % (ref 39.0–52.0)
Hemoglobin: 13.6 g/dL (ref 13.0–17.0)

## 2020-12-23 MED ORDER — COLCHICINE 0.6 MG PO TABS
0.6000 mg | ORAL_TABLET | Freq: Every day | ORAL | 1 refills | Status: DC
  Filled 2020-12-23: qty 30, 30d supply, fill #0
  Filled 2021-02-08: qty 30, 30d supply, fill #1
  Filled 2021-02-09: qty 30, 30d supply, fill #0

## 2020-12-23 MED ORDER — TRAMADOL HCL 50 MG PO TABS
50.0000 mg | ORAL_TABLET | Freq: Four times a day (QID) | ORAL | Status: DC | PRN
Start: 2020-12-23 — End: 2020-12-24
  Administered 2020-12-23 (×2): 100 mg via ORAL
  Filled 2020-12-23 (×2): qty 2

## 2020-12-23 MED ORDER — BISACODYL 10 MG RE SUPP
10.0000 mg | Freq: Once | RECTAL | Status: AC
  Administered 2020-12-23: 10 mg via RECTAL
  Filled 2020-12-23: qty 1

## 2020-12-23 NOTE — Discharge Summary (Addendum)
Date of admission: 12/22/2020  Date of discharge: 12/24/2020  Admission diagnosis: Renal mass   Discharge diagnosis: Renal mass  Secondary diagnoses:  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   Aortic atherosclerosis ([place]) 11/27/2020   Renal mass 11/27/2020   Need for tetanus, diphtheria, and acellular pertussis (Tdap) vaccine 05/23/2020   Overweight (BMI 25.0-29.9) 01/24/2020   OSA (obstructive sleep apnea) 01/24/2020   History of colon polyps 01/24/2020   Chronic anticoagulation 03/28/2019   Paroxysmal atrial fibrillation ([place]) 03/12/2018   Essential hypertension 03/12/2018   BPH associated with nocturia 03/12/2018   Chronic gout of multiple sites 03/12/2018   Long-term use of aspirin therapy 03/12/2018   Chest pain 03/27/2017    Procedures performed: Procedure(s): XI ROBOTIC ASSITED  NEPHRECTOMY  History and Physical: For full details, please see admission history and physical. Briefly, Albert Pop. is a 64 y.o. year old patient with history of 6cm Bosniak iV right complex renal cyst concerning for malignancy. He presented for RIGHT robotic assisted partial versus radial nephrectomy.   Hospital Course: Patient tolerated the procedure well. His mass was located right adjacent to the hilum making renorrhaphy impossible so a radical nephrectomy was performed.  He was then transferred to the floor after an uneventful PACU stay.  He had no issues overnight. POD1 labs showed a creatinine of 2.08. This was repeated in the afternoon on POD1 and it was 2.37 so his home avapro was held. On POD2 his creatinine remained stable at 2.25. His foley catheter was removed and his passed a TOV. He had flatus and a bowel movement prior to discharge. On POD#2 he had met discharge criteria: was eating a regular diet, was up and ambulating independently,  pain was well controlled, was voiding without a catheter, and was ready to for discharge. He  was instructed to hold his avapro at home, take colchicine as needed, and to restart his Eliquis day of discharge.    Laboratory values:  Recent Labs    12/22/20 1134 12/23/20 0411  HGB 13.5 13.6  HCT 42.1 42.7   Recent Labs    12/23/20 0411 12/23/20 1346 12/24/20 0405  NA 135 134* 139  K 4.6 3.7 4.4  CL 99 99 104  CO2 26 26 27   GLUCOSE 168* 158* 105*  BUN 16 18 21   CREATININE 2.08* 2.37* 2.25*  CALCIUM 8.5* 8.1* 8.4*   No results for input(s): LABPT, INR in the last 72 hours. No results for input(s): LABURIN in the last 72 hours. Results for orders placed or performed in visit on 12/18/20  SARS Coronavirus 2 (TAT 6-24 hrs)     Status: None   Collection Time: 12/18/20 12:00 AM  Result Value Ref Range Status   SARS Coronavirus 2 RESULT: NEGATIVE  Final    Comment: RESULT: NEGATIVESARS-CoV-2 INTERPRETATION:A NEGATIVE  test result means that SARS-CoV-2 RNA was not present in the specimen above the limit of detection of this test. This does not preclude a possible SARS-CoV-2 infection and should not be used as the  sole basis for patient management decisions. Negative results must be combined with clinical observations, patient history, and epidemiological information. Optimum specimen types and timing for peak viral levels during infections caused by SARS-CoV-2  have not been determined. Collection of multiple specimens or types of specimens may be necessary to detect virus. Improper specimen collection and handling, sequence variability under primers/probes, or organism present below the limit of detection may  lead to false negative results. Positive and negative predictive values of testing are highly dependent on prevalence. False negative test results are more likely when prevalence of disease is high.The expected result is NEGATIVE.Fact S heet for  Healthcare Providers: LocalChronicle.no Sheet for Patients:  SalonLookup.es Reference Range - Negative     Disposition: Home  Discharge instruction: The patient was instructed to be ambulatory but told to refrain from heavy lifting, strenuous activity, or driving.   Discharge medications:  Allergies as of 12/24/2020       Reactions   Other Hives   HAIR DYE        Medication List     STOP taking these medications    cholecalciferol 25 MCG (1000 UNIT) tablet Commonly known as: VITAMIN D3   irbesartan 300 MG tablet Commonly known as: AVAPRO   multivitamin with minerals Tabs tablet       TAKE these medications    acetaminophen 500 MG tablet Commonly known as: TYLENOL Take 1 tablet (500 mg total) by mouth every 6 (six) hours as needed. What changed:  how much to take reasons to take this   allopurinol 300 MG tablet Commonly known as: ZYLOPRIM TAKE 1 TABLET (300 MG TOTAL) BY MOUTH DAILY. TO TREAT GOUT   apixaban 5 MG Tabs tablet Commonly known as: Eliquis Take 1 tablet (5 mg total) by mouth 2 (two) times daily.   atorvastatin 10 MG tablet Commonly known as: LIPITOR Take 1 tablet (10 mg total) by mouth daily.   Cartia XT 300 MG 24 hr capsule Generic drug: diltiazem TAKE 1 CAPSULE (300 MG TOTAL) BY MOUTH DAILY.   clomiPHENE 50 MG tablet Commonly known as: CLOMID Take 25 mg by mouth daily.   colchicine 0.6 MG tablet Take 1 tablet (0.6 mg total) by mouth daily.   docusate sodium 100 MG capsule Commonly known as: COLACE Take 1 capsule (100 mg total) by mouth 2 (two) times daily.   flecainide 50 MG tablet Commonly known as: TAMBOCOR TAKE 1 TABLET (50 MG TOTAL) BY MOUTH DAILY.   hydrALAZINE 10 MG tablet Commonly known as: APRESOLINE Take 1 tablet (10 mg total) by mouth 2 (two) times daily.   metoprolol succinate 100 MG 24 hr tablet Commonly known as: TOPROL-XL TAKE 1 TABLET (100 MG TOTAL) BY MOUTH DAILY. TAKE WITH OR IMMEDIATELY FOLLOWING A MEAL.    promethazine-dextromethorphan 6.25-15 MG/5ML syrup Commonly known as: PROMETHAZINE-DM Take 5 mLs by mouth 4 (four) times daily as needed for cough. What changed: when to take this   sildenafil 20 MG tablet Commonly known as: REVATIO Take 20-100 mg by mouth daily as needed for erectile dysfunction.   tamsulosin 0.4 MG Caps capsule Commonly known as: FLOMAX TAKE 2 CAPSULES (0.8 MG TOTAL) BY MOUTH AT BEDTIME.   traMADol 50 MG tablet Commonly known as: Ultram Take 1-2 tablets (50-100 mg total) by mouth every 6 (six) hours as needed for moderate pain or severe pain.        Followup:   Follow-up Information     Raynelle Bring, MD Follow up on 01/13/2021.   Specialty: Urology Why: at 10:45 Contact information: [place]  [id number] [phone]

## 2020-12-23 NOTE — Progress Notes (Signed)
Pt continues to refuse CPAP qhs. 

## 2020-12-23 NOTE — Progress Notes (Signed)
Pt foley d/c this am, due to void, pt ambulated in hall. Suppository given as ordered. Diet changed to soft. Pain 3/10. Will continue to monitor. SRP, RN

## 2020-12-23 NOTE — Progress Notes (Signed)
Pt refused CPAP qhs.  Pt encouraged to contact RT should he change his mind.  Machine remains in room. 

## 2020-12-23 NOTE — Care Management (Signed)
°  Transition of Care [facility]) Screening Note   Patient Details  Name: Albert Good. Date of Birth: 28-May-1955   Transition of Care [facility] CM/SW Contact:    Purcell Mouton, RN Phone Number: 12/23/2020, 1:40 PM    Transition of Care Department [facility]) has reviewed patient and no TOC needs have been identified at this time. We will continue to monitor patient advancement through interdisciplinary progression rounds. If new patient transition needs arise, please place a TOC consult.

## 2020-12-23 NOTE — Progress Notes (Signed)
Patient ID: Albert Good., male   DOB: [birth year], 65 y.o.   MRN: [id number]  1 Day Post-Op Subjective: Doing well.  Has not ambulated yet.  Tolerating liquids.  No flatus.  Objective: Vital signs in last 24 hours: Temp:  [97.5 F (36.4 C)-98.9 F (37.2 C)] 98.9 F (37.2 C) (12/13 0625) Pulse Rate:  [53-94] 79 (12/13 0625) Resp:  [10-19] 16 (12/13 0625) BP: (143-180)/(75-106) 152/94 (12/13 0625) SpO2:  [92 %-100 %] 97 % (12/13 0625) Weight:  [101.5 kg] 101.5 kg (12/12 1317)  Intake/Output from previous day: 12/12 0701 - 12/13 0700 In: 3621.3 [P.O.:200; I.V.:3271.3; IV Piggyback:150] Out: 5000 [Urine:4950; Blood:50] Intake/Output this shift: No intake/output data recorded.  Physical Exam:  General: Alert and oriented CV: RRR Lungs: Clear Abdomen: Soft, ND, positive bowel sounds Incisions: C/D/I Ext: NT, No erythema   Lab Results: Recent Labs    12/22/20 1134 12/23/20 0411  HGB 13.5 13.6  HCT 42.1 42.7   BMET Recent Labs    12/22/20 1134 12/23/20 0411  NA 139 135  K 3.5 4.6  CL 104 99  CO2 26 26  GLUCOSE 151* 168*  BUN 10 16  CREATININE 1.25* 2.08*  CALCIUM 8.3* 8.5*     Studies/Results: No results found.  Assessment/Plan: POD # 1 s/p right RAL radical nephrectomy - Ambulate, IS, DVT prophylaxis - Advance diet as tolerated - Monitor renal function, will adjust antihypertensive meds if needed later today - D/C Foley - Oral pain medication - Will assess for discharge later today vs tomorrow   LOS: 1 day   Dutch Gray 12/23/2020, 7:41 AM

## 2020-12-24 ENCOUNTER — Other Ambulatory Visit: Payer: Self-pay

## 2020-12-24 LAB — BASIC METABOLIC PANEL
Anion gap: 8 (ref 5–15)
BUN: 21 mg/dL (ref 8–23)
CO2: 27 mmol/L (ref 22–32)
Calcium: 8.4 mg/dL — ABNORMAL LOW (ref 8.9–10.3)
Chloride: 104 mmol/L (ref 98–111)
Creatinine, Ser: 2.25 mg/dL — ABNORMAL HIGH (ref 0.61–1.24)
GFR, Estimated: 31 mL/min — ABNORMAL LOW (ref >60)
Glucose, Bld: 105 mg/dL — ABNORMAL HIGH (ref 70–99)
Potassium: 4.4 mmol/L (ref 3.5–5.1)
Sodium: 139 mmol/L (ref 135–145)

## 2020-12-24 MED ORDER — APIXABAN 5 MG PO TABS: 5.0000 mg | ORAL_TABLET | Freq: Two times a day (BID) | ORAL | 2 refills | Status: DC

## 2020-12-24 NOTE — Plan of Care (Signed)

## 2020-12-24 NOTE — Progress Notes (Signed)
Message sent via secure chat concerning pt supplied home medication that was charted as given by day RN on 12/13  From Jigna M Gadhia, [facility]    hi there, we need to use patient's own clomiphene since we don't carry. looks like RN yesterday charted that she gave it but it was never brought down to pharmacy for verification/packaging/dispensing per [place] policy. please bring down to Rx once necessary paperwork filled out. thank you!  Response from [place] check with the patient to see when happened during days. Pt said the pills are supplied in a blister pack, he did not bring the blister pack with him, just at 7 day pill box with the medication in it.  This RN will pass on in report to get medication verified prior to patient taking

## 2020-12-24 NOTE — Progress Notes (Signed)
Patient's PIV removed.  Discharge instructions provided to and reviewed with patient.  Patient instructed that he can take Eliquis this evening, per verbal order from MD.  Patient walked to main entrance with belongings for transport home with son.  Angie Fava, RN

## 2020-12-25 ENCOUNTER — Telehealth: Payer: Self-pay

## 2020-12-25 LAB — SURGICAL PATHOLOGY

## 2020-12-25 NOTE — Telephone Encounter (Signed)
Transition Care Management Follow-up Telephone Call Date of discharge and from where: 12/24/2020, [facility] How have you been since you were released from the hospital? He said he isn't feeling bad and is getting up and walking around. He said his surgical site is clean, no dressing.  Any questions or concerns? No  Items Reviewed: Did the pt receive and understand the discharge instructions provided? Yes  Medications obtained and verified? Yes - he said he has all medications and did not have any questions about med regime  Other? No  Any new allergies since your discharge? No  Dietary orders reviewed? Yes Do you have support at home? Yes   Home Care and Equipment/Supplies: Were home health services ordered? no If so, what is the name of the agency? N/a  Has the agency set up a time to come to the patient's home? not applicable Were any new equipment or medical supplies ordered?  No What is the name of the medical supply agency? N/a Were you able to get the supplies/equipment? not applicable Do you have any questions related to the use of the equipment or supplies? No  Functional Questionnaire: (I = Independent and D = Dependent) ADLs: independent. Hopes to return to work soon.    Follow up appointments reviewed:  PCP Hospital f/u appt confirmed?  He said he will call to schedule an appointment    Specialist Hospital f/u appt confirmed? Yes  Scheduled to see urology on 01/13/2021. Are transportation arrangements needed? No  If their condition worsens, is the pt aware to call PCP or go to the Emergency Dept.? Yes Was the patient provided with contact information for the PCP's office or ED? Yes Was to pt encouraged to call back with questions or concerns? Yes

## 2020-12-29 ENCOUNTER — Other Ambulatory Visit: Payer: Self-pay

## 2020-12-29 DIAGNOSIS — G4733 Obstructive sleep apnea (adult) (pediatric): Secondary | ICD-10-CM | POA: Diagnosis not present

## 2021-01-05 DIAGNOSIS — G4733 Obstructive sleep apnea (adult) (pediatric): Secondary | ICD-10-CM | POA: Diagnosis not present

## 2021-01-06 ENCOUNTER — Other Ambulatory Visit: Payer: Self-pay

## 2021-01-06 MED FILL — Flecainide Acetate Tab 50 MG: ORAL | 90 days supply | Qty: 90 | Fill #2 | Status: AC

## 2021-01-06 MED FILL — Diltiazem HCl Coated Beads Cap ER 24HR 300 MG: ORAL | 90 days supply | Qty: 90 | Fill #2 | Status: AC

## 2021-01-08 ENCOUNTER — Other Ambulatory Visit: Payer: Self-pay

## 2021-01-13 ENCOUNTER — Other Ambulatory Visit: Payer: Self-pay | Admitting: Internal Medicine

## 2021-01-13 ENCOUNTER — Other Ambulatory Visit: Payer: Self-pay

## 2021-01-13 DIAGNOSIS — C641 Malignant neoplasm of right kidney, except renal pelvis: Secondary | ICD-10-CM | POA: Diagnosis not present

## 2021-01-14 ENCOUNTER — Other Ambulatory Visit: Payer: Self-pay

## 2021-01-14 ENCOUNTER — Other Ambulatory Visit: Payer: Self-pay | Admitting: Pharmacist

## 2021-01-14 MED ORDER — APIXABAN 5 MG PO TABS
5.0000 mg | ORAL_TABLET | Freq: Two times a day (BID) | ORAL | 2 refills | Status: DC
  Filled 2021-01-14 (×2): qty 60, 30d supply, fill #0
  Filled 2021-02-08: qty 60, 30d supply, fill #1
  Filled 2021-02-09: qty 60, 30d supply, fill #0
  Filled 2021-03-13: qty 60, 30d supply, fill #1

## 2021-01-21 ENCOUNTER — Encounter: Payer: Self-pay | Admitting: Internal Medicine

## 2021-01-21 NOTE — Progress Notes (Signed)
Patient seen by urologist Dr. Alinda Money on 01/13/2021.  I have received his note.  Patient had robot assisted laparoscopic radical nephrectomy.  An attempt at partial nephrectomy was made but his tumor was not amendable to this intraoperatively.  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  His prognosis would be quite good.  He mentioned that they will be doing surveillance of his chest and abdomen. 2.  Patient has a left scrotal mass.  Patient was offered an ultrasound for further evaluation and even discussion of excision.  However patient stated that he had this previously evaluated when he was in [place] and he does not wish to proceed with additional evaluation at this time.  Dr. Alinda Money has recommended that he at least let Dr. Milford Cage reexamine this at his next appointment.

## 2021-01-24 MED FILL — Metoprolol Succinate Tab ER 24HR 100 MG (Tartrate Equiv): ORAL | 30 days supply | Qty: 30 | Fill #8 | Status: CN

## 2021-01-26 ENCOUNTER — Other Ambulatory Visit: Payer: Self-pay

## 2021-01-26 MED FILL — Metoprolol Succinate Tab ER 24HR 100 MG (Tartrate Equiv): ORAL | 30 days supply | Qty: 30 | Fill #0 | Status: AC

## 2021-01-30 DIAGNOSIS — G4733 Obstructive sleep apnea (adult) (pediatric): Secondary | ICD-10-CM | POA: Diagnosis not present

## 2021-02-05 DIAGNOSIS — G4733 Obstructive sleep apnea (adult) (pediatric): Secondary | ICD-10-CM | POA: Diagnosis not present

## 2021-02-08 ENCOUNTER — Other Ambulatory Visit: Payer: Self-pay | Admitting: Internal Medicine

## 2021-02-08 DIAGNOSIS — R351 Nocturia: Secondary | ICD-10-CM

## 2021-02-08 DIAGNOSIS — N401 Enlarged prostate with lower urinary tract symptoms: Secondary | ICD-10-CM

## 2021-02-09 ENCOUNTER — Other Ambulatory Visit (HOSPITAL_COMMUNITY): Payer: Self-pay

## 2021-02-09 ENCOUNTER — Other Ambulatory Visit: Payer: Self-pay

## 2021-02-09 MED ORDER — TAMSULOSIN HCL 0.4 MG PO CAPS
ORAL_CAPSULE | ORAL | 1 refills | Status: DC
  Filled 2021-02-09: qty 180, fill #0
  Filled 2021-02-09: qty 60, 30d supply, fill #0
  Filled 2021-03-13: qty 60, 30d supply, fill #1
  Filled 2021-04-11: qty 180, 90d supply, fill #2
  Filled 2021-04-13: qty 60, 30d supply, fill #2
  Filled 2021-05-15: qty 60, 30d supply, fill #3
  Filled 2021-06-11: qty 60, 30d supply, fill #4
  Filled 2021-07-14: qty 60, 30d supply, fill #5

## 2021-02-09 NOTE — Telephone Encounter (Signed)
Requested Prescriptions  Pending Prescriptions Disp Refills   tamsulosin (FLOMAX) 0.4 MG CAPS capsule 180 capsule 1    Sig: TAKE 2 CAPSULES (0.8 MG TOTAL) BY MOUTH AT BEDTIME.     Urology: Alpha-Adrenergic Blocker Passed - 02/08/2021  3:36 PM      Passed - Last BP in normal range    BP Readings from Last 1 Encounters:  12/24/20 139/82         Passed - Valid encounter within last 12 months    Recent Outpatient Visits          2 months ago Essential hypertension   [place], MD   6 months ago Primary hypertension   [place], Jarome Matin, RPH-CPP   7 months ago Primary hypertension   [place], Jarome Matin, RPH-CPP   7 months ago Medicare annual wellness visit, initial   [place], Carroll Sage, FNP   8 months ago Essential hypertension   [place] Ladell Pier, MD

## 2021-02-12 ENCOUNTER — Other Ambulatory Visit: Payer: Self-pay

## 2021-02-24 ENCOUNTER — Other Ambulatory Visit: Payer: Self-pay

## 2021-02-24 MED FILL — Metoprolol Succinate Tab ER 24HR 100 MG (Tartrate Equiv): ORAL | 30 days supply | Qty: 30 | Fill #1 | Status: AC

## 2021-03-02 DIAGNOSIS — G4733 Obstructive sleep apnea (adult) (pediatric): Secondary | ICD-10-CM | POA: Diagnosis not present

## 2021-03-11 ENCOUNTER — Other Ambulatory Visit: Payer: Self-pay | Admitting: Internal Medicine

## 2021-03-11 ENCOUNTER — Other Ambulatory Visit: Payer: Self-pay

## 2021-03-11 DIAGNOSIS — M1A09X0 Idiopathic chronic gout, multiple sites, without tophus (tophi): Secondary | ICD-10-CM

## 2021-03-11 MED ORDER — ALLOPURINOL 300 MG PO TABS
ORAL_TABLET | ORAL | 0 refills | Status: DC
  Filled 2021-03-11 (×3): qty 30, 30d supply, fill #0

## 2021-03-13 ENCOUNTER — Other Ambulatory Visit: Payer: Self-pay

## 2021-03-17 ENCOUNTER — Other Ambulatory Visit: Payer: Self-pay | Admitting: Internal Medicine

## 2021-03-17 DIAGNOSIS — I480 Paroxysmal atrial fibrillation: Secondary | ICD-10-CM

## 2021-03-17 DIAGNOSIS — I10 Essential (primary) hypertension: Secondary | ICD-10-CM

## 2021-03-17 DIAGNOSIS — M1A09X0 Idiopathic chronic gout, multiple sites, without tophus (tophi): Secondary | ICD-10-CM

## 2021-03-18 ENCOUNTER — Other Ambulatory Visit: Payer: Self-pay

## 2021-03-18 MED ORDER — FLECAINIDE ACETATE 50 MG PO TABS
50.0000 mg | ORAL_TABLET | Freq: Every day | ORAL | 0 refills | Status: DC
  Filled 2021-03-18 (×2): qty 30, 30d supply, fill #0

## 2021-03-18 MED ORDER — METOPROLOL SUCCINATE ER 100 MG PO TB24
100.0000 mg | ORAL_TABLET | Freq: Every day | ORAL | 0 refills | Status: DC
  Filled 2021-03-18: qty 30, 30d supply, fill #0

## 2021-03-18 NOTE — Telephone Encounter (Signed)
Requested medications are due for refill today.  yes ? ?Requested medications are on the active medications list.  yes ? ?Last refill. 12/23/2020 #30 1 refill ? ?Future visit scheduled.   no ? ?Notes to clinic.  Failed protocol d/t abnormal labs. ? ? ? ?Requested Prescriptions  ?Pending Prescriptions Disp Refills  ? colchicine 0.6 MG tablet 30 tablet 1  ?  Sig: Take 1 tablet (0.6 mg total) by mouth daily.  ?  ? Endocrinology:  Gout Agents - colchicine Failed - 03/17/2021  8:09 PM  ?  ?  Failed - Cr in normal range and within 360 days  ?  Creatinine, Ser  ?Date Value Ref Range Status  ?12/24/2020 2.25 (H) 0.61 - 1.24 mg/dL Final  ?  ?  ?  ?  Failed - CBC within normal limits and completed in the last 12 months  ?  WBC  ?Date Value Ref Range Status  ?11/27/2020 5.8 4.0 - 10.5 K/uL Final  ? ?RBC  ?Date Value Ref Range Status  ?11/27/2020 5.21 4.22 - 5.81 MIL/uL Final  ? ?Hemoglobin  ?Date Value Ref Range Status  ?12/23/2020 13.6 13.0 - 17.0 g/dL Final  ?03/12/2020 14.1 13.0 - 17.7 g/dL Final  ? ?HCT  ?Date Value Ref Range Status  ?12/23/2020 42.7 39.0 - 52.0 % Final  ?  Comment:  ?  Performed at [facility], [place] [address], [place], [place] [address]  ? ?Hematocrit  ?Date Value Ref Range Status  ?03/12/2020 40.6 37.5 - 51.0 % Final  ? ?MCHC  ?Date Value Ref Range Status  ?11/27/2020 31.6 30.0 - 36.0 g/dL Final  ? ?MCH  ?Date Value Ref Range Status  ?11/27/2020 27.6 26.0 - 34.0 pg Final  ? ?MCV  ?Date Value Ref Range Status  ?11/27/2020 87.3 80.0 - 100.0 fL Final  ?03/12/2020 83 79 - 97 fL Final  ? ?No results found for: PLTCOUNTKUC, LABPLAT, [place] ?RDW  ?Date Value Ref Range Status  ?11/27/2020 14.8 11.5 - 15.5 % Final  ?03/12/2020 14.3 11.6 - 15.4 % Final  ? ?  ?  ?  Passed - ALT in normal range and within 360 days  ?  ALT  ?Date Value Ref Range Status  ?11/27/2020 14 0 - 44 IU/L Final  ?  ?  ?  ?  Passed - AST in normal range and within 360 days  ?  AST  ?Date Value Ref Range Status  ?11/27/2020 20  0 - 40 IU/L Final  ?  ?  ?  ?  Passed - Valid encounter within last 12 months  ?  Recent Outpatient Visits   ? ?      ? 3 months ago Essential hypertension  ? [place] Ladell Pier, MD  ? 7 months ago Primary hypertension  ? [place], RPH-CPP  ? 8 months ago Primary hypertension  ? [place], RPH-CPP  ? 9 months ago Medicare annual wellness visit, initial  ? [place], FNP  ? 9 months ago Essential hypertension  ? [place] Ladell Pier, MD  ? ?  ?  ? ?  ?  ?  ?  ?

## 2021-03-19 ENCOUNTER — Other Ambulatory Visit: Payer: Self-pay

## 2021-03-20 ENCOUNTER — Other Ambulatory Visit: Payer: Self-pay

## 2021-03-23 ENCOUNTER — Other Ambulatory Visit: Payer: Self-pay

## 2021-03-23 MED ORDER — COLCHICINE 0.6 MG PO TABS
0.6000 mg | ORAL_TABLET | Freq: Every day | ORAL | 0 refills | Status: DC
  Filled 2021-03-23: qty 30, 30d supply, fill #0

## 2021-03-24 ENCOUNTER — Other Ambulatory Visit: Payer: Self-pay

## 2021-03-30 DIAGNOSIS — G4733 Obstructive sleep apnea (adult) (pediatric): Secondary | ICD-10-CM | POA: Diagnosis not present

## 2021-03-31 ENCOUNTER — Other Ambulatory Visit: Payer: Self-pay | Admitting: Internal Medicine

## 2021-03-31 ENCOUNTER — Other Ambulatory Visit: Payer: Self-pay

## 2021-04-01 MED ORDER — ATORVASTATIN CALCIUM 10 MG PO TABS
10.0000 mg | ORAL_TABLET | Freq: Every day | ORAL | 0 refills | Status: DC
  Filled 2021-04-01: qty 30, 30d supply, fill #0

## 2021-04-01 NOTE — Telephone Encounter (Signed)
Requested medications are due for refill today.  yes ? ?Requested medications are on the active medications list.  yes ? ?Last refill. 11/28/2020 #30 3 refills ? ?Future visit scheduled.   no ? ?Notes to clinic.  Pt was to return for lab recheck 1 month after starting statin.  ? ? ? ?Requested Prescriptions  ?Pending Prescriptions Disp Refills  ? atorvastatin (LIPITOR) 10 MG tablet 30 tablet 3  ?  Sig: Take 1 tablet (10 mg total) by mouth daily.  ?  ? Cardiovascular:  Antilipid - Statins Failed - 03/31/2021  7:45 AM  ?  ?  Failed - Lipid Panel in normal range within the last 12 months  ?  Cholesterol, Total  ?Date Value Ref Range Status  ?11/27/2020 162 100 - 199 mg/dL Final  ? ?LDL Chol Calc (NIH)  ?Date Value Ref Range Status  ?11/27/2020 82 0 - 99 mg/dL Final  ? ?HDL  ?Date Value Ref Range Status  ?11/27/2020 65 >39 mg/dL Final  ? ?Triglycerides  ?Date Value Ref Range Status  ?11/27/2020 78 0 - 149 mg/dL Final  ? ?  ?  ?  Passed - Patient is not pregnant  ?  ?  Passed - Valid encounter within last 12 months  ?  Recent Outpatient Visits   ? ?      ? 4 months ago Essential hypertension  ? [place] Ladell Pier, MD  ? 8 months ago Primary hypertension  ? [place], RPH-CPP  ? 9 months ago Primary hypertension  ? [place], RPH-CPP  ? 9 months ago Medicare annual wellness visit, initial  ? [place], FNP  ? 10 months ago Essential hypertension  ? [place] Ladell Pier, MD  ? ?  ?  ? ?  ?  ?  ?  ?

## 2021-04-02 ENCOUNTER — Other Ambulatory Visit: Payer: Self-pay

## 2021-04-09 ENCOUNTER — Other Ambulatory Visit: Payer: Self-pay | Admitting: Internal Medicine

## 2021-04-09 DIAGNOSIS — M1A09X0 Idiopathic chronic gout, multiple sites, without tophus (tophi): Secondary | ICD-10-CM

## 2021-04-10 ENCOUNTER — Other Ambulatory Visit: Payer: Self-pay | Admitting: Internal Medicine

## 2021-04-10 ENCOUNTER — Other Ambulatory Visit: Payer: Self-pay

## 2021-04-10 DIAGNOSIS — I10 Essential (primary) hypertension: Secondary | ICD-10-CM

## 2021-04-10 DIAGNOSIS — I480 Paroxysmal atrial fibrillation: Secondary | ICD-10-CM

## 2021-04-10 MED ORDER — DILTIAZEM HCL ER COATED BEADS 300 MG PO CP24
ORAL_CAPSULE | Freq: Every day | ORAL | 0 refills | Status: DC
  Filled 2021-04-10: qty 90, 90d supply, fill #0

## 2021-04-11 ENCOUNTER — Other Ambulatory Visit: Payer: Self-pay | Admitting: Internal Medicine

## 2021-04-11 DIAGNOSIS — M1A09X0 Idiopathic chronic gout, multiple sites, without tophus (tophi): Secondary | ICD-10-CM

## 2021-04-13 ENCOUNTER — Other Ambulatory Visit: Payer: Self-pay

## 2021-04-13 MED ORDER — ALLOPURINOL 300 MG PO TABS
ORAL_TABLET | ORAL | 0 refills | Status: DC
  Filled 2021-04-13: qty 90, 90d supply, fill #0

## 2021-04-13 NOTE — Telephone Encounter (Signed)
Eloquis duplicate. ?Requested Prescriptions  ?Pending Prescriptions Disp Refills  ?? apixaban (ELIQUIS) 5 MG TABS tablet 60 tablet 2  ?  Sig: Take 1 tablet (5 mg total) by mouth 2 (two) times daily.  ?  ? Hematology:  Anticoagulants - apixaban Failed - 04/13/2021  2:52 PM  ?  ?  Failed - Cr in normal range and within 360 days  ?  Creatinine, Ser  ?Date Value Ref Range Status  ?12/24/2020 2.25 (H) 0.61 - 1.24 mg/dL Final  ?   ?  ?  Passed - PLT in normal range and within 360 days  ?  Platelets  ?Date Value Ref Range Status  ?11/27/2020 282 150 - 400 K/uL Final  ?03/12/2020 199 150 - 450 x10E3/uL Final  ?   ?  ?  Passed - HGB in normal range and within 360 days  ?  Hemoglobin  ?Date Value Ref Range Status  ?12/23/2020 13.6 13.0 - 17.0 g/dL Final  ?03/12/2020 14.1 13.0 - 17.7 g/dL Final  ?   ?  ?  Passed - HCT in normal range and within 360 days  ?  HCT  ?Date Value Ref Range Status  ?12/23/2020 42.7 39.0 - 52.0 % Final  ?  Comment:  ?  Performed at [facility], [place] [address] [place], [place] [address]  ? ?Hematocrit  ?Date Value Ref Range Status  ?03/12/2020 40.6 37.5 - 51.0 % Final  ?   ?  ?  Passed - AST in normal range and within 360 days  ?  AST  ?Date Value Ref Range Status  ?11/27/2020 20 0 - 40 IU/L Final  ?   ?  ?  Passed - ALT in normal range and within 360 days  ?  ALT  ?Date Value Ref Range Status  ?11/27/2020 14 0 - 44 IU/L Final  ?   ?  ?  Passed - Valid encounter within last 12 months  ?  Recent Outpatient Visits   ?      ? 4 months ago Essential hypertension  ? [place] Ladell Pier, MD  ? 8 months ago Primary hypertension  ? [place], RPH-CPP  ? 9 months ago Primary hypertension  ? [place], RPH-CPP  ? 9 months ago Medicare annual wellness visit, initial  ? [place], FNP  ? 10 months  ago Essential hypertension  ? [place] Ladell Pier, MD  ?  ?  ?Future Appointments   ?        ? In 3 months Ladell Pier, MD [place]  ?  ? ?  ?  ?  ?? allopurinol (ZYLOPRIM) 300 MG tablet 90 tablet 0  ?  Sig: TAKE 1 TABLET (300 MG TOTAL) BY MOUTH DAILY. TO TREAT GOUT  ?  ? Endocrinology:  Gout Agents - allopurinol Failed - 04/13/2021  2:52 PM  ?  ?  Failed - Uric Acid in normal range and within 360 days  ?  Uric Acid  ?Date Value Ref Range Status  ?09/28/2018 3.4 (L) 3.7 - 8.6 mg/dL Final  ?  Comment:  ?             Therapeutic target for gout patients: <6.0  ?   ?  ?  Failed -  Cr in normal range and within 360 days  ?  Creatinine, Ser  ?Date Value Ref Range Status  ?12/24/2020 2.25 (H) 0.61 - 1.24 mg/dL Final  ?   ?  ?  Failed - CBC within normal limits and completed in the last 12 months  ?  WBC  ?Date Value Ref Range Status  ?11/27/2020 5.8 4.0 - 10.5 K/uL Final  ? ?RBC  ?Date Value Ref Range Status  ?11/27/2020 5.21 4.22 - 5.81 MIL/uL Final  ? ?Hemoglobin  ?Date Value Ref Range Status  ?12/23/2020 13.6 13.0 - 17.0 g/dL Final  ?03/12/2020 14.1 13.0 - 17.7 g/dL Final  ? ?HCT  ?Date Value Ref Range Status  ?12/23/2020 42.7 39.0 - 52.0 % Final  ?  Comment:  ?  Performed at [facility], [place] [address], [place], [place] [address]  ? ?Hematocrit  ?Date Value Ref Range Status  ?03/12/2020 40.6 37.5 - 51.0 % Final  ? ?MCHC  ?Date Value Ref Range Status  ?11/27/2020 31.6 30.0 - 36.0 g/dL Final  ? ?MCH  ?Date Value Ref Range Status  ?11/27/2020 27.6 26.0 - 34.0 pg Final  ? ?MCV  ?Date Value Ref Range Status  ?11/27/2020 87.3 80.0 - 100.0 fL Final  ?03/12/2020 83 79 - 97 fL Final  ? ?No results found for: PLTCOUNTKUC, LABPLAT, [place] ?RDW  ?Date Value Ref Range Status  ?11/27/2020 14.8 11.5 - 15.5 % Final  ?03/12/2020 14.3 11.6 - 15.4 % Final  ? ?  ?  ?  Passed - Valid encounter within last 12 months  ?  Recent Outpatient  Visits   ?      ? 4 months ago Essential hypertension  ? [place] Ladell Pier, MD  ? 8 months ago Primary hypertension  ? [place], RPH-CPP  ? 9 months ago Primary hypertension  ? [place], RPH-CPP  ? 9 months ago Medicare annual wellness visit, initial  ? [place], FNP  ? 10 months ago Essential hypertension  ? [place] Ladell Pier, MD  ?  ?  ?Future Appointments   ?        ? In 3 months Ladell Pier, MD [place]  ?  ? ?  ?  ?  ? ? ?

## 2021-04-13 NOTE — Telephone Encounter (Signed)
Medication Refill - Medication: allopurinol (ZYLOPRIM) 300 MG tablet ? ?Has the patient contacted their pharmacy? Yes.   ? ?Preferred Pharmacy (with phone number or street name): [place] at [facility] ? ?Has the patient been seen for an appointment in the last year OR does the patient have an upcoming appointment? Yes.   ? ?Agent: Please be advised that RX refills may take up to 3 business days. We ask that you follow-up with your pharmacy.  ? ?Pt scheduled the first available w/ Dr Wynetta Emery which is 07/27/21 ?

## 2021-04-13 NOTE — Telephone Encounter (Signed)
Duplicate request. ?Requested Prescriptions  ?Pending Prescriptions Disp Refills  ?? apixaban (ELIQUIS) 5 MG TABS tablet 60 tablet 2  ?  Sig: Take 1 tablet (5 mg total) by mouth 2 (two) times daily.  ?  ? Hematology:  Anticoagulants - apixaban Failed - 04/13/2021  2:52 PM  ?  ?  Failed - Cr in normal range and within 360 days  ?  Creatinine, Ser  ?Date Value Ref Range Status  ?12/24/2020 2.25 (H) 0.61 - 1.24 mg/dL Final  ?   ?  ?  Passed - PLT in normal range and within 360 days  ?  Platelets  ?Date Value Ref Range Status  ?11/27/2020 282 150 - 400 K/uL Final  ?03/12/2020 199 150 - 450 x10E3/uL Final  ?   ?  ?  Passed - HGB in normal range and within 360 days  ?  Hemoglobin  ?Date Value Ref Range Status  ?12/23/2020 13.6 13.0 - 17.0 g/dL Final  ?03/12/2020 14.1 13.0 - 17.7 g/dL Final  ?   ?  ?  Passed - HCT in normal range and within 360 days  ?  HCT  ?Date Value Ref Range Status  ?12/23/2020 42.7 39.0 - 52.0 % Final  ?  Comment:  ?  Performed at [facility], [place] [address], [place], [place] [address]  ? ?Hematocrit  ?Date Value Ref Range Status  ?03/12/2020 40.6 37.5 - 51.0 % Final  ?   ?  ?  Passed - AST in normal range and within 360 days  ?  AST  ?Date Value Ref Range Status  ?11/27/2020 20 0 - 40 IU/L Final  ?   ?  ?  Passed - ALT in normal range and within 360 days  ?  ALT  ?Date Value Ref Range Status  ?11/27/2020 14 0 - 44 IU/L Final  ?   ?  ?  Passed - Valid encounter within last 12 months  ?  Recent Outpatient Visits   ?      ? 4 months ago Essential hypertension  ? [place] Ladell Pier, MD  ? 8 months ago Primary hypertension  ? [place], RPH-CPP  ? 9 months ago Primary hypertension  ? [place], RPH-CPP  ? 9 months ago Medicare annual wellness visit, initial  ? [place], FNP  ? 10 months  ago Essential hypertension  ? [place] Ladell Pier, MD  ?  ?  ?Future Appointments   ?        ? In 3 months Ladell Pier, MD [place]  ?  ? ?  ?  ?  ?Signed Prescriptions Disp Refills  ? allopurinol (ZYLOPRIM) 300 MG tablet 90 tablet 0  ?  Sig: TAKE 1 TABLET (300 MG TOTAL) BY MOUTH DAILY. TO TREAT GOUT  ?  ? Endocrinology:  Gout Agents - allopurinol Failed - 04/13/2021  2:52 PM  ?  ?  Failed - Uric Acid in normal range and within 360 days  ?  Uric Acid  ?Date Value Ref Range Status  ?09/28/2018 3.4 (L) 3.7 - 8.6 mg/dL Final  ?  Comment:  ?             Therapeutic target for gout patients: <6.0  ?   ?  ?  Failed - Cr in normal range and within 360 days  ?  Creatinine, Ser  ?Date Value Ref Range Status  ?12/24/2020 2.25 (H) 0.61 - 1.24 mg/dL Final  ?   ?  ?  Failed - CBC within normal limits and completed in the last 12 months  ?  WBC  ?Date Value Ref Range Status  ?11/27/2020 5.8 4.0 - 10.5 K/uL Final  ? ?RBC  ?Date Value Ref Range Status  ?11/27/2020 5.21 4.22 - 5.81 MIL/uL Final  ? ?Hemoglobin  ?Date Value Ref Range Status  ?12/23/2020 13.6 13.0 - 17.0 g/dL Final  ?03/12/2020 14.1 13.0 - 17.7 g/dL Final  ? ?HCT  ?Date Value Ref Range Status  ?12/23/2020 42.7 39.0 - 52.0 % Final  ?  Comment:  ?  Performed at [facility], [place] [address], [place], [place] [address]  ? ?Hematocrit  ?Date Value Ref Range Status  ?03/12/2020 40.6 37.5 - 51.0 % Final  ? ?MCHC  ?Date Value Ref Range Status  ?11/27/2020 31.6 30.0 - 36.0 g/dL Final  ? ?MCH  ?Date Value Ref Range Status  ?11/27/2020 27.6 26.0 - 34.0 pg Final  ? ?MCV  ?Date Value Ref Range Status  ?11/27/2020 87.3 80.0 - 100.0 fL Final  ?03/12/2020 83 79 - 97 fL Final  ? ?No results found for: PLTCOUNTKUC, LABPLAT, [place] ?RDW  ?Date Value Ref Range Status  ?11/27/2020 14.8 11.5 - 15.5 % Final  ?03/12/2020 14.3 11.6 - 15.4 % Final  ? ?  ?  ?  Passed - Valid encounter within last 12  months  ?  Recent Outpatient Visits   ?      ? 4 months ago Essential hypertension  ? [place] Ladell Pier, MD  ? 8 months ago Primary hypertension  ? [place], RPH-CPP  ? 9 months ago Primary hypertension  ? [place], RPH-CPP  ? 9 months ago Medicare annual wellness visit, initial  ? [place], FNP  ? 10 months ago Essential hypertension  ? [place] Ladell Pier, MD  ?  ?  ?Future Appointments   ?        ? In 3 months Ladell Pier, MD [place]  ?  ? ?  ?  ?  ? ? ?

## 2021-04-13 NOTE — Telephone Encounter (Signed)
Requested medication (s) are due for refill today: yes ? ?Requested medication (s) are on the active medication list: yes ? ?Last refill:  01/14/21 #60 with 2 RF ? ?Future visit scheduled: 07/27/21 ? ?Notes to clinic:  Creatinine is getting more elevated each time it is checked. I know he has a history that may cause that but please assess. ? ? ?  ? ?Requested Prescriptions  ?Pending Prescriptions Disp Refills  ? apixaban (ELIQUIS) 5 MG TABS tablet 60 tablet 2  ?  Sig: Take 1 tablet (5 mg total) by mouth 2 (two) times daily.  ?  ? Hematology:  Anticoagulants - apixaban Failed - 04/13/2021  9:05 AM  ?  ?  Failed - Cr in normal range and within 360 days  ?  Creatinine, Ser  ?Date Value Ref Range Status  ?12/24/2020 2.25 (H) 0.61 - 1.24 mg/dL Final  ?  ?  ?  ?  Passed - PLT in normal range and within 360 days  ?  Platelets  ?Date Value Ref Range Status  ?11/27/2020 282 150 - 400 K/uL Final  ?03/12/2020 199 150 - 450 x10E3/uL Final  ?  ?  ?  ?  Passed - HGB in normal range and within 360 days  ?  Hemoglobin  ?Date Value Ref Range Status  ?12/23/2020 13.6 13.0 - 17.0 g/dL Final  ?03/12/2020 14.1 13.0 - 17.7 g/dL Final  ?  ?  ?  ?  Passed - HCT in normal range and within 360 days  ?  HCT  ?Date Value Ref Range Status  ?12/23/2020 42.7 39.0 - 52.0 % Final  ?  Comment:  ?  Performed at [facility], [place] [address], [place], [place] [address]  ? ?Hematocrit  ?Date Value Ref Range Status  ?03/12/2020 40.6 37.5 - 51.0 % Final  ?  ?  ?  ?  Passed - AST in normal range and within 360 days  ?  AST  ?Date Value Ref Range Status  ?11/27/2020 20 0 - 40 IU/L Final  ?  ?  ?  ?  Passed - ALT in normal range and within 360 days  ?  ALT  ?Date Value Ref Range Status  ?11/27/2020 14 0 - 44 IU/L Final  ?  ?  ?  ?  Passed - Valid encounter within last 12 months  ?  Recent Outpatient Visits   ? ?      ? 4 months ago Essential hypertension  ? [place] Ladell Pier, MD  ? 8 months ago  Primary hypertension  ? [place], RPH-CPP  ? 9 months ago Primary hypertension  ? [place], RPH-CPP  ? 9 months ago Medicare annual wellness visit, initial  ? [place], FNP  ? 10 months ago Essential hypertension  ? [place] Ladell Pier, MD  ? ?  ?  ?Future Appointments   ? ?        ? In 3 months Ladell Pier, MD [place]  ? ?  ? ?  ?  ?  ? ? ?

## 2021-04-14 ENCOUNTER — Other Ambulatory Visit: Payer: Self-pay

## 2021-04-14 ENCOUNTER — Other Ambulatory Visit: Payer: Self-pay | Admitting: Internal Medicine

## 2021-04-15 ENCOUNTER — Other Ambulatory Visit: Payer: Self-pay

## 2021-04-15 MED ORDER — APIXABAN 5 MG PO TABS
5.0000 mg | ORAL_TABLET | Freq: Two times a day (BID) | ORAL | 2 refills | Status: DC
  Filled 2021-04-15: qty 60, 30d supply, fill #0
  Filled 2021-05-15: qty 60, 30d supply, fill #1
  Filled 2021-06-11: qty 60, 30d supply, fill #2

## 2021-04-16 ENCOUNTER — Other Ambulatory Visit: Payer: Self-pay | Admitting: Internal Medicine

## 2021-04-16 DIAGNOSIS — M1A09X0 Idiopathic chronic gout, multiple sites, without tophus (tophi): Secondary | ICD-10-CM

## 2021-04-17 ENCOUNTER — Other Ambulatory Visit: Payer: Self-pay

## 2021-04-17 NOTE — Telephone Encounter (Signed)
Requested medication (s) are due for refill today: yes ? ?Requested medication (s) are on the active medication list: yes ? ?Last refill:  03/23/21 #30 with 0 RF ? ?Future visit scheduled: yes, 07/27/21 ? ?Notes to clinic:  had a curtesy refill and did make an appt but appt not until July, please assess. ? ? ?  ? ?Requested Prescriptions  ?Pending Prescriptions Disp Refills  ? colchicine 0.6 MG tablet 30 tablet 0  ?  Sig: Take 1 tablet (0.6 mg total) by mouth daily.  ?  ? Endocrinology:  Gout Agents - colchicine Failed - 04/16/2021  9:17 PM  ?  ?  Failed - Cr in normal range and within 360 days  ?  Creatinine, Ser  ?Date Value Ref Range Status  ?12/24/2020 2.25 (H) 0.61 - 1.24 mg/dL Final  ?  ?  ?  ?  Failed - CBC within normal limits and completed in the last 12 months  ?  WBC  ?Date Value Ref Range Status  ?11/27/2020 5.8 4.0 - 10.5 K/uL Final  ? ?RBC  ?Date Value Ref Range Status  ?11/27/2020 5.21 4.22 - 5.81 MIL/uL Final  ? ?Hemoglobin  ?Date Value Ref Range Status  ?12/23/2020 13.6 13.0 - 17.0 g/dL Final  ?03/12/2020 14.1 13.0 - 17.7 g/dL Final  ? ?HCT  ?Date Value Ref Range Status  ?12/23/2020 42.7 39.0 - 52.0 % Final  ?  Comment:  ?  Performed at [facility], [place] [address], [place], [place] [address]  ? ?Hematocrit  ?Date Value Ref Range Status  ?03/12/2020 40.6 37.5 - 51.0 % Final  ? ?MCHC  ?Date Value Ref Range Status  ?11/27/2020 31.6 30.0 - 36.0 g/dL Final  ? ?MCH  ?Date Value Ref Range Status  ?11/27/2020 27.6 26.0 - 34.0 pg Final  ? ?MCV  ?Date Value Ref Range Status  ?11/27/2020 87.3 80.0 - 100.0 fL Final  ?03/12/2020 83 79 - 97 fL Final  ? ?No results found for: PLTCOUNTKUC, LABPLAT, [place] ?RDW  ?Date Value Ref Range Status  ?11/27/2020 14.8 11.5 - 15.5 % Final  ?03/12/2020 14.3 11.6 - 15.4 % Final  ? ?  ?  ?  Passed - ALT in normal range and within 360 days  ?  ALT  ?Date Value Ref Range Status  ?11/27/2020 14 0 - 44 IU/L Final  ?  ?  ?  ?  Passed - AST in normal range and within  360 days  ?  AST  ?Date Value Ref Range Status  ?11/27/2020 20 0 - 40 IU/L Final  ?  ?  ?  ?  Passed - Valid encounter within last 12 months  ?  Recent Outpatient Visits   ? ?      ? 4 months ago Essential hypertension  ? [place] Ladell Pier, MD  ? 8 months ago Primary hypertension  ? [place], RPH-CPP  ? 9 months ago Primary hypertension  ? [place], RPH-CPP  ? 10 months ago Medicare annual wellness visit, initial  ? [place], FNP  ? 10 months ago Essential hypertension  ? [place] Ladell Pier, MD  ? ?  ?  ?Future Appointments   ? ?        ? In 3 months Ladell Pier, MD  [place]  ? ?  ? ?  ?  ?  ? ? ?

## 2021-04-19 MED ORDER — COLCHICINE 0.6 MG PO TABS
0.6000 mg | ORAL_TABLET | Freq: Every day | ORAL | 2 refills | Status: DC
  Filled 2021-04-19: qty 30, 30d supply, fill #0
  Filled 2021-05-21: qty 30, 30d supply, fill #1
  Filled 2021-06-18: qty 30, 30d supply, fill #2

## 2021-04-20 ENCOUNTER — Other Ambulatory Visit: Payer: Self-pay

## 2021-04-24 ENCOUNTER — Other Ambulatory Visit: Payer: Self-pay

## 2021-04-24 ENCOUNTER — Other Ambulatory Visit: Payer: Self-pay | Admitting: Internal Medicine

## 2021-04-24 DIAGNOSIS — I480 Paroxysmal atrial fibrillation: Secondary | ICD-10-CM

## 2021-04-24 DIAGNOSIS — I10 Essential (primary) hypertension: Secondary | ICD-10-CM

## 2021-04-24 MED ORDER — METOPROLOL SUCCINATE ER 100 MG PO TB24
100.0000 mg | ORAL_TABLET | Freq: Every day | ORAL | 0 refills | Status: DC
  Filled 2021-04-24: qty 15, 15d supply, fill #0

## 2021-04-30 ENCOUNTER — Other Ambulatory Visit: Payer: Self-pay

## 2021-04-30 ENCOUNTER — Other Ambulatory Visit: Payer: Self-pay | Admitting: Internal Medicine

## 2021-04-30 DIAGNOSIS — G4733 Obstructive sleep apnea (adult) (pediatric): Secondary | ICD-10-CM | POA: Diagnosis not present

## 2021-04-30 DIAGNOSIS — I10 Essential (primary) hypertension: Secondary | ICD-10-CM

## 2021-04-30 MED ORDER — ATORVASTATIN CALCIUM 10 MG PO TABS
10.0000 mg | ORAL_TABLET | Freq: Every day | ORAL | 0 refills | Status: DC
  Filled 2021-04-30: qty 30, 30d supply, fill #0

## 2021-04-30 MED ORDER — HYDRALAZINE HCL 10 MG PO TABS
10.0000 mg | ORAL_TABLET | Freq: Two times a day (BID) | ORAL | 2 refills | Status: DC
  Filled 2021-04-30: qty 60, 30d supply, fill #0
  Filled 2021-05-30: qty 60, 30d supply, fill #1
  Filled 2021-06-29: qty 60, 30d supply, fill #2

## 2021-04-30 NOTE — Telephone Encounter (Signed)
Future visit in 2 months . ?Requested Prescriptions  ?Pending Prescriptions Disp Refills  ?? atorvastatin (LIPITOR) 10 MG tablet 30 tablet 0  ?  Sig: Take 1 tablet (10 mg total) by mouth daily.  ?  ? Cardiovascular:  Antilipid - Statins Failed - 04/30/2021  6:48 AM  ?  ?  Failed - Lipid Panel in normal range within the last 12 months  ?  Cholesterol, Total  ?Date Value Ref Range Status  ?11/27/2020 162 100 - 199 mg/dL Final  ? ?LDL Chol Calc (NIH)  ?Date Value Ref Range Status  ?11/27/2020 82 0 - 99 mg/dL Final  ? ?HDL  ?Date Value Ref Range Status  ?11/27/2020 65 >39 mg/dL Final  ? ?Triglycerides  ?Date Value Ref Range Status  ?11/27/2020 78 0 - 149 mg/dL Final  ? ?  ?  ?  Passed - Patient is not pregnant  ?  ?  Passed - Valid encounter within last 12 months  ?  Recent Outpatient Visits   ?      ? 5 months ago Essential hypertension  ? [place] Albert Pier, MD  ? 8 months ago Primary hypertension  ? [place], RPH-CPP  ? 10 months ago Primary hypertension  ? [place], RPH-CPP  ? 10 months ago Medicare annual wellness visit, initial  ? [place], FNP  ? 11 months ago Essential hypertension  ? [name], MD  ?  ?  ?Future Appointments   ?        ? In 2 months Albert Pier, MD [place]  ?  ? ?  ?  ?  ?? hydrALAZINE (APRESOLINE) 10 MG tablet 60 tablet 2  ?  Sig: Take 1 tablet (10 mg total) by mouth 2 (two) times daily.  ?  ? Cardiovascular:  Vasodilators Failed - 04/30/2021  6:48 AM  ?  ?  Failed - ANA Screen, Ifa, Serum in normal range and within 360 days  ?  No results found for: ANA, ANATITER, LABANTI   ?  ?  Passed - HCT in normal range and within 360 days  ?  HCT  ?Date Value Ref Range Status  ?12/23/2020 42.7 39.0 - 52.0 % Final   ?  Comment:  ?  Performed at [facility], [place] [address], [place], [place] [address]  ? ?Hematocrit  ?Date Value Ref Range Status  ?03/12/2020 40.6 37.5 - 51.0 % Final  ?   ?  ?  Passed - HGB in normal range and within 360 days  ?  Hemoglobin  ?Date Value Ref Range Status  ?12/23/2020 13.6 13.0 - 17.0 g/dL Final  ?03/12/2020 14.1 13.0 - 17.7 g/dL Final  ?   ?  ?  Passed - RBC in normal range and within 360 days  ?  RBC  ?Date Value Ref Range Status  ?11/27/2020 5.21 4.22 - 5.81 MIL/uL Final  ?   ?  ?  Passed - WBC in normal range and within 360 days  ?  WBC  ?Date Value Ref Range Status  ?11/27/2020 5.8 4.0 - 10.5 K/uL Final  ?   ?  ?  Passed - PLT in normal range and within 360 days  ?  Platelets  ?Date Value Ref  Range Status  ?11/27/2020 282 150 - 400 K/uL Final  ?03/12/2020 199 150 - 450 x10E3/uL Final  ?   ?  ?  Passed - Last BP in normal range  ?  BP Readings from Last 1 Encounters:  ?12/24/20 139/82  ?   ?  ?  Passed - Valid encounter within last 12 months  ?  Recent Outpatient Visits   ?      ? 5 months ago Essential hypertension  ? [place] Albert Pier, MD  ? 8 months ago Primary hypertension  ? [place], RPH-CPP  ? 10 months ago Primary hypertension  ? [place], RPH-CPP  ? 10 months ago Medicare annual wellness visit, initial  ? [place], FNP  ? 11 months ago Essential hypertension  ? [place] Albert Pier, MD  ?  ?  ?Future Appointments   ?        ? In 2 months Albert Pier, MD [place]  ?  ? ?  ?  ?  ? ?

## 2021-05-01 ENCOUNTER — Other Ambulatory Visit: Payer: Self-pay

## 2021-05-08 ENCOUNTER — Other Ambulatory Visit: Payer: Self-pay

## 2021-05-08 ENCOUNTER — Other Ambulatory Visit: Payer: Self-pay | Admitting: Internal Medicine

## 2021-05-08 DIAGNOSIS — I480 Paroxysmal atrial fibrillation: Secondary | ICD-10-CM

## 2021-05-08 DIAGNOSIS — I10 Essential (primary) hypertension: Secondary | ICD-10-CM

## 2021-05-08 MED ORDER — FLECAINIDE ACETATE 50 MG PO TABS
50.0000 mg | ORAL_TABLET | Freq: Every day | ORAL | 0 refills | Status: DC
  Filled 2021-05-08: qty 30, 30d supply, fill #0

## 2021-05-08 MED ORDER — METOPROLOL SUCCINATE ER 100 MG PO TB24
100.0000 mg | ORAL_TABLET | Freq: Every day | ORAL | 0 refills | Status: DC
  Filled 2021-05-08 (×2): qty 15, 15d supply, fill #0

## 2021-05-15 ENCOUNTER — Other Ambulatory Visit: Payer: Self-pay

## 2021-05-21 ENCOUNTER — Other Ambulatory Visit: Payer: Self-pay

## 2021-05-21 ENCOUNTER — Other Ambulatory Visit: Payer: Self-pay | Admitting: Internal Medicine

## 2021-05-21 DIAGNOSIS — I480 Paroxysmal atrial fibrillation: Secondary | ICD-10-CM

## 2021-05-21 DIAGNOSIS — I10 Essential (primary) hypertension: Secondary | ICD-10-CM

## 2021-05-22 ENCOUNTER — Other Ambulatory Visit: Payer: Self-pay | Admitting: Internal Medicine

## 2021-05-22 ENCOUNTER — Other Ambulatory Visit: Payer: Self-pay

## 2021-05-22 DIAGNOSIS — I10 Essential (primary) hypertension: Secondary | ICD-10-CM

## 2021-05-22 DIAGNOSIS — I480 Paroxysmal atrial fibrillation: Secondary | ICD-10-CM

## 2021-05-25 ENCOUNTER — Other Ambulatory Visit: Payer: Self-pay

## 2021-05-26 ENCOUNTER — Other Ambulatory Visit: Payer: Self-pay

## 2021-05-26 ENCOUNTER — Telehealth: Payer: Self-pay | Admitting: Internal Medicine

## 2021-05-26 DIAGNOSIS — I10 Essential (primary) hypertension: Secondary | ICD-10-CM

## 2021-05-26 DIAGNOSIS — I480 Paroxysmal atrial fibrillation: Secondary | ICD-10-CM

## 2021-05-26 MED ORDER — METOPROLOL SUCCINATE ER 100 MG PO TB24
100.0000 mg | ORAL_TABLET | Freq: Every day | ORAL | 0 refills | Status: DC
  Filled 2021-05-26: qty 90, 90d supply, fill #0

## 2021-05-26 NOTE — Telephone Encounter (Signed)
?*  STAT* If patient is at the pharmacy, call can be transferred to refill team. ? ? ?1. Which medications need to be refilled? (please list name of each medication and dose if known) metoprolol succinate (TOPROL-XL) 100 MG 24 hr tablet ? ?2. Which pharmacy/location (including street and city if local pharmacy) is medication to be sent to? [place] at [facility]  ? ?3. Do they need a 30 day or 90 day supply? 90 ? ?Patient has an appt schd for 6/19 ? ?

## 2021-05-26 NOTE — Telephone Encounter (Signed)
Pt's medication was sent to pt's pharmacy as requested. Confirmation received.  °

## 2021-05-30 ENCOUNTER — Other Ambulatory Visit: Payer: Self-pay | Admitting: Internal Medicine

## 2021-05-30 DIAGNOSIS — G4733 Obstructive sleep apnea (adult) (pediatric): Secondary | ICD-10-CM | POA: Diagnosis not present

## 2021-06-01 ENCOUNTER — Other Ambulatory Visit: Payer: Self-pay

## 2021-06-01 MED ORDER — ATORVASTATIN CALCIUM 10 MG PO TABS
10.0000 mg | ORAL_TABLET | Freq: Every day | ORAL | 1 refills | Status: DC
  Filled 2021-06-01: qty 30, 30d supply, fill #0
  Filled 2021-06-29: qty 30, 30d supply, fill #1

## 2021-06-01 NOTE — Telephone Encounter (Signed)
Keep up coming appointment. Requested Prescriptions  Pending Prescriptions Disp Refills  . atorvastatin (LIPITOR) 10 MG tablet 30 tablet 1    Sig: Take 1 tablet (10 mg total) by mouth daily.     Cardiovascular:  Antilipid - Statins Failed - 05/30/2021  9:35 AM      Failed - Lipid Panel in normal range within the last 12 months    Cholesterol, Total  Date Value Ref Range Status  11/27/2020 162 100 - 199 mg/dL Final   LDL Chol Calc (NIH)  Date Value Ref Range Status  11/27/2020 82 0 - 99 mg/dL Final   HDL  Date Value Ref Range Status  11/27/2020 65 >39 mg/dL Final   Triglycerides  Date Value Ref Range Status  11/27/2020 78 0 - 149 mg/dL Final         Passed - Patient is not pregnant      Passed - Valid encounter within last 12 months    Recent Outpatient Visits          6 months ago Essential hypertension   [place], MD   10 months ago Primary hypertension   [place], Jarome Matin, RPH-CPP   11 months ago Primary hypertension   [place], Stephen L, RPH-CPP   11 months ago Medicare annual wellness visit, initial   [place], FNP   1 year ago Essential hypertension   [place], Deborah B, MD      Future Appointments            In 4 weeks Kathlen Mody, Blair Dolphin, PA-C [place]   In 1 month Wynetta Emery, Dalbert Batman, MD [place]

## 2021-06-11 ENCOUNTER — Other Ambulatory Visit: Payer: Self-pay | Admitting: Internal Medicine

## 2021-06-11 ENCOUNTER — Other Ambulatory Visit: Payer: Self-pay

## 2021-06-11 DIAGNOSIS — I480 Paroxysmal atrial fibrillation: Secondary | ICD-10-CM

## 2021-06-11 MED ORDER — FLECAINIDE ACETATE 50 MG PO TABS
50.0000 mg | ORAL_TABLET | Freq: Every day | ORAL | 0 refills | Status: DC
  Filled 2021-06-11: qty 30, 30d supply, fill #0

## 2021-06-12 ENCOUNTER — Other Ambulatory Visit: Payer: Self-pay

## 2021-06-15 ENCOUNTER — Other Ambulatory Visit (HOSPITAL_COMMUNITY): Payer: Self-pay | Admitting: Urology

## 2021-06-15 ENCOUNTER — Ambulatory Visit (HOSPITAL_COMMUNITY)
Admission: RE | Admit: 2021-06-15 | Discharge: 2021-06-15 | Disposition: A | Discharge status: Home / Self Care | Payer: Medicare HMO | Source: Ambulatory Visit | Attending: Urology | Admitting: Urology

## 2021-06-15 DIAGNOSIS — I10 Essential (primary) hypertension: Secondary | ICD-10-CM | POA: Diagnosis not present

## 2021-06-15 DIAGNOSIS — Z85528 Personal history of other malignant neoplasm of kidney: Secondary | ICD-10-CM | POA: Diagnosis not present

## 2021-06-15 DIAGNOSIS — M47814 Spondylosis without myelopathy or radiculopathy, thoracic region: Secondary | ICD-10-CM | POA: Diagnosis not present

## 2021-06-15 DIAGNOSIS — C641 Malignant neoplasm of right kidney, except renal pelvis: Secondary | ICD-10-CM | POA: Diagnosis not present

## 2021-06-15 DIAGNOSIS — M19011 Primary osteoarthritis, right shoulder: Secondary | ICD-10-CM | POA: Diagnosis not present

## 2021-06-16 ENCOUNTER — Other Ambulatory Visit: Payer: Self-pay | Admitting: Urology

## 2021-06-16 DIAGNOSIS — R972 Elevated prostate specific antigen [PSA]: Secondary | ICD-10-CM

## 2021-06-18 ENCOUNTER — Other Ambulatory Visit: Payer: Self-pay

## 2021-06-19 ENCOUNTER — Other Ambulatory Visit: Payer: Self-pay

## 2021-06-28 NOTE — Progress Notes (Signed)
Office Visit    Patient Name: Albert Good. Date of Encounter: 06/29/2021  PCP:  Ladell Pier, MD   [place]  Cardiologist:  Dorris Carnes, MD  Advanced Practice Provider:  No care team member to display Electrophysiologist:  None   Chief Complaint    Albert Good. is a 66 y.o. male with a hx of PAF, HTN and OSA on CPAP Presents today for follow-up appointment. 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   He was seen by Dr. Fransico Him a year ago for evaluation of OSA.  He was doing well with his CPAP device at that time.  He tolerated a full mask and felt adequate pressure.  He has had no significant daytime sleepiness as of that appointment.  Today, he states he has been doing pretty well.  He had his right kidney removed back in December for cystic clear-cell carcinoma.  He was started on statin therapy at that time for hyperlipidemia.  He also has a history of atrial fibrillation and is anticoagulated with Eliquis.  He is sinus bradycardia with rate in the 50s.  However, he is asymptomatic.  His blood pressure is well controlled today.  He has not had any lower extremity edema.  He has been compliant with his CPAP for his sleep apnea.  He retired but then went back to work and works over 50 hours a week.  We discussed increasing cardiovascular exercise to daily and he is interested in returning to the gym.  Reports no shortness of breath nor dyspnea on exertion. Reports no chest pain, pressure, or tightness. No edema, orthopnea, PND. Reports no palpitations.    Past Medical History    Past Medical History:  Diagnosis Date   BPH associated with nocturia 03/12/2018   BPH with obstruction/lower urinary tract symptoms    Chronic gout of multiple sites 03/12/2018   Dysrhythmia    Afib   Essential hypertension    Gout    Long-term use of aspirin therapy 03/12/2018   OSA (obstructive sleep apnea)    uses  Cpap   Paroxysmal atrial fibrillation [facility])    Past Surgical History:  Procedure Laterality Date   LIPOSUCTION     ROBOTIC ASSITED PARTIAL NEPHRECTOMY Right 12/22/2020   Procedure: XI ROBOTIC ASSITED  NEPHRECTOMY;  Surgeon: Raynelle Bring, MD;  Location: [facility];  Service: Urology;  Laterality: Right;    Allergies  Allergies  Allergen Reactions   Other Hives    HAIR DYE    EKGs/Labs/Other Studies Reviewed:   The following studies were reviewed today:  Long-term monitor 4/22  Patch Wear Time:  14 days and 0 hours   SInus rhythm  Rates 45 to 154 bpm  Average HR 68 bpm  Short burst SVT (? Atrial tach) Longest 13 beats  Not sensed.  Rare PVC, couple No pauses NO triggered events  EKG:  EKG is  ordered today.  The ekg ordered today demonstrates sinus bradycardia.  Recent Labs: 11/27/2020: ALT 14; Platelets 282 12/23/2020: Hemoglobin 13.6 12/24/2020: BUN 21; Creatinine, Ser 2.25; Potassium 4.4; Sodium 139  Recent Lipid Panel    Component Value Date/Time   CHOL 162 11/27/2020 1059   TRIG 78 11/27/2020 1059   HDL 65 11/27/2020 1059   CHOLHDL 2.5 11/27/2020 1059   LDLCALC 82 11/27/2020 1059    Risk Assessment/Calculations:   CHA2DS2-VASc Score = 2   This indicates a 2.2% annual risk  of stroke. The patient's score is based upon: CHF History: 0 HTN History: 1 Diabetes History: 0 Stroke History: 0 Vascular Disease History: 0 Age Score: 1 Gender Score: 0     Home Medications   Current Meds  Medication Sig   allopurinol (ZYLOPRIM) 300 MG tablet TAKE 1 TABLET (300 MG TOTAL) BY MOUTH DAILY. TO TREAT GOUT   apixaban (ELIQUIS) 5 MG TABS tablet Take 1 tablet (5 mg total) by mouth 2 (two) times daily.   atorvastatin (LIPITOR) 10 MG tablet Take 1 tablet (10 mg total) by mouth once daily.   colchicine 0.6 MG tablet Take 1 tablet (0.6 mg total) by mouth daily.   diltiazem (CARDIZEM CD) 300 MG 24 hr capsule TAKE 1 CAPSULE (300 MG TOTAL) BY MOUTH DAILY.   flecainide  (TAMBOCOR) 50 MG tablet Take 1 tablet (50 mg total) by mouth daily. Please make overdue appt with Dr.Ross before anymore refills. Thank you 1st attempt   hydrALAZINE (APRESOLINE) 10 MG tablet Take 1 tablet (10 mg total) by mouth 2 (two) times daily.   metoprolol succinate (TOPROL-XL) 100 MG 24 hr tablet Take 1 tablet (100 mg total) by mouth daily. Take with or immediately following a meal. Please keep upcoming appt in June 2023 with Cardiologist before anymore refills. Thank you Final Attempt   tamsulosin (FLOMAX) 0.4 MG CAPS capsule TAKE 2 CAPSULES (0.8 MG TOTAL) BY MOUTH AT BEDTIME.     Review of Systems      All other systems reviewed and are otherwise negative except as noted above.  Physical Exam    VS:  BP 122/70   Pulse (!) 51   Ht '6\' 2"'$  (1.88 m)   Wt 230 lb 6.4 oz (104.5 kg)   SpO2 95%   BMI 29.58 kg/m  , BMI Body mass index is 29.58 kg/m.  Wt Readings from Last 3 Encounters:  06/29/21 230 lb 6.4 oz (104.5 kg)  12/22/20 223 lb 12.3 oz (101.5 kg)  11/27/20 223 lb (101.2 kg)     GEN: Well nourished, well developed, in no acute distress. HEENT: normal. Neck: Supple, no JVD, carotid bruits, or masses. Cardiac: sinus bradycardia, no murmurs, rubs, or gallops. No clubbing, cyanosis, edema.  Radials/PT 2+ and equal bilaterally.  Respiratory:  Respirations regular and unlabored, clear to auscultation bilaterally. GI: Soft, nontender, nondistended. MS: No deformity or atrophy. Skin: Warm and dry, no rash. Neuro:  Strength and sensation are intact. Psych: Normal affect.  Assessment & Plan    HTN -Continue cardizem, toprol XL, and hydralazine -refills provided today  PAF -Continue cardizem, Eliquis, Flecainide -no issues with bleeding on his blood thinner -sinus bradycardia today -HR usually in the 50s-60s at home, asymptomatic   OSA -continue CPAP therapy -Follows with Dr. Radford Pax  Hyperlipidemia -Continue lipitor -no recent LDL-can get drawn when he has his  appointment with PCP next month     Disposition: Follow up 1 year with Dorris Carnes, MD or APP.  Signed, Elgie Collard, PA-C 06/29/2021, 2:10 PM [facility]

## 2021-06-29 ENCOUNTER — Encounter: Payer: Self-pay | Admitting: Physician Assistant

## 2021-06-29 ENCOUNTER — Encounter: Payer: Self-pay | Admitting: *Deleted

## 2021-06-29 ENCOUNTER — Other Ambulatory Visit: Payer: Self-pay

## 2021-06-29 ENCOUNTER — Ambulatory Visit: Payer: Medicare HMO | Admitting: Physician Assistant

## 2021-06-29 VITALS — BP 122/70 | HR 51 | Ht 74.0 in | Wt 230.4 lb

## 2021-06-29 DIAGNOSIS — I10 Essential (primary) hypertension: Secondary | ICD-10-CM

## 2021-06-29 DIAGNOSIS — I480 Paroxysmal atrial fibrillation: Secondary | ICD-10-CM

## 2021-06-29 DIAGNOSIS — Z905 Acquired absence of kidney: Secondary | ICD-10-CM

## 2021-06-29 DIAGNOSIS — G4733 Obstructive sleep apnea (adult) (pediatric): Secondary | ICD-10-CM

## 2021-06-29 MED ORDER — FLECAINIDE ACETATE 50 MG PO TABS
50.0000 mg | ORAL_TABLET | Freq: Every day | ORAL | 3 refills | Status: DC
  Filled 2021-06-29 – 2021-07-10 (×2): qty 90, 90d supply, fill #0
  Filled 2021-10-08: qty 90, 90d supply, fill #1
  Filled 2021-12-30: qty 90, 90d supply, fill #2
  Filled 2022-03-31: qty 90, 90d supply, fill #3

## 2021-06-29 MED ORDER — METOPROLOL SUCCINATE ER 100 MG PO TB24
100.0000 mg | ORAL_TABLET | Freq: Every day | ORAL | 3 refills | Status: DC
  Filled 2021-06-29 – 2021-08-15 (×2): qty 90, 90d supply, fill #0
  Filled 2021-11-15: qty 90, 90d supply, fill #1

## 2021-06-29 MED ORDER — APIXABAN 5 MG PO TABS
5.0000 mg | ORAL_TABLET | Freq: Two times a day (BID) | ORAL | 3 refills | Status: DC
  Filled 2021-06-29 – 2021-07-10 (×2): qty 180, 90d supply, fill #0
  Filled 2021-10-08: qty 180, 90d supply, fill #1
  Filled 2022-01-05: qty 180, 90d supply, fill #2

## 2021-06-29 MED ORDER — HYDRALAZINE HCL 10 MG PO TABS: 10.0000 mg | ORAL_TABLET | Freq: Two times a day (BID) | ORAL | 3 refills | Status: DC

## 2021-06-29 MED ORDER — DILTIAZEM HCL ER COATED BEADS 300 MG PO CP24
ORAL_CAPSULE | Freq: Every day | ORAL | 3 refills | Status: DC
  Filled 2021-06-29: qty 90, 90d supply, fill #0
  Filled 2021-10-08: qty 90, 90d supply, fill #1
  Filled 2021-12-30 – 2022-01-01 (×2): qty 90, 90d supply, fill #2

## 2021-06-29 MED ORDER — ATORVASTATIN CALCIUM 10 MG PO TABS
10.0000 mg | ORAL_TABLET | Freq: Every day | ORAL | 3 refills | Status: DC
  Filled 2021-07-27: qty 90, 90d supply, fill #0
  Filled 2021-10-19: qty 90, 90d supply, fill #1
  Filled 2022-01-19: qty 90, 90d supply, fill #2

## 2021-06-29 NOTE — Patient Instructions (Signed)
Medication Instructions:  Your physician recommends that you continue on your current medications as directed. Please refer to the Current Medication list given to you today.  *If you need a refill on your cardiac medications before your next appointment, please call your pharmacy*   Lab Work: Lipids with your primary care provider when you see them next month If you have labs (blood work) drawn today and your tests are completely normal, you will receive your results only by: [place] (if you have MyChart) OR A paper copy in the mail If you have any lab test that is abnormal or we need to change your treatment, we will call you to review the results.  Follow-Up: At [facility], you and your health needs are our priority.  As part of our continuing mission to provide you with exceptional heart care, we have created designated Provider Care Teams.  These Care Teams include your primary Cardiologist (physician) and Advanced Practice Providers (APPs -  Physician Assistants and Nurse Practitioners) who all work together to provide you with the care you need, when you need it.  We recommend signing up for the patient portal called "MyChart".  Sign up information is provided on this After Visit Summary.  MyChart is used to connect with patients for Virtual Visits (Telemedicine).  Patients are able to view lab/test results, encounter notes, upcoming appointments, etc.  Non-urgent messages can be sent to your provider as well.   To learn more about what you can do with MyChart, go to [email or web address].    Your next appointment:   1 year(s)  The format for your next appointment:   In Person  Provider:   Dorris Carnes, MD {  Important Information About Sugar

## 2021-06-30 ENCOUNTER — Other Ambulatory Visit: Payer: Self-pay

## 2021-06-30 DIAGNOSIS — G4733 Obstructive sleep apnea (adult) (pediatric): Secondary | ICD-10-CM | POA: Diagnosis not present

## 2021-07-02 ENCOUNTER — Ambulatory Visit
Admission: RE | Admit: 2021-07-02 | Discharge: 2021-07-02 | Disposition: A | Discharge status: Home / Self Care | Payer: Medicare HMO | Source: Ambulatory Visit | Attending: Urology | Admitting: Urology

## 2021-07-02 DIAGNOSIS — R972 Elevated prostate specific antigen [PSA]: Secondary | ICD-10-CM

## 2021-07-02 MED ORDER — GADOBENATE DIMEGLUMINE 529 MG/ML IV SOLN
20.0000 mL | Freq: Once | INTRAVENOUS | Status: AC | PRN
  Administered 2021-07-02: 20 mL via INTRAVENOUS

## 2021-07-10 ENCOUNTER — Other Ambulatory Visit: Payer: Self-pay | Admitting: Internal Medicine

## 2021-07-10 ENCOUNTER — Other Ambulatory Visit: Payer: Self-pay

## 2021-07-10 DIAGNOSIS — M1A09X0 Idiopathic chronic gout, multiple sites, without tophus (tophi): Secondary | ICD-10-CM

## 2021-07-10 DIAGNOSIS — C641 Malignant neoplasm of right kidney, except renal pelvis: Secondary | ICD-10-CM | POA: Diagnosis not present

## 2021-07-10 MED ORDER — ALLOPURINOL 300 MG PO TABS
ORAL_TABLET | ORAL | 0 refills | Status: DC
  Filled 2021-07-10: qty 30, 30d supply, fill #0

## 2021-07-10 NOTE — Telephone Encounter (Signed)
Has appt in July. Requested Prescriptions  Pending Prescriptions Disp Refills  . allopurinol (ZYLOPRIM) 300 MG tablet 30 tablet 0    Sig: TAKE 1 TABLET (300 MG TOTAL) BY MOUTH DAILY. TO TREAT GOUT     Endocrinology:  Gout Agents - allopurinol Failed - 07/10/2021 10:12 AM      Failed - Uric Acid in normal range and within 360 days    Uric Acid  Date Value Ref Range Status  09/28/2018 3.4 (L) 3.7 - 8.6 mg/dL Final    Comment:               Therapeutic target for gout patients: <6.0         Failed - Cr in normal range and within 360 days    Creatinine, Ser  Date Value Ref Range Status  12/24/2020 2.25 (H) 0.61 - 1.24 mg/dL Final         Failed - CBC within normal limits and completed in the last 12 months    WBC  Date Value Ref Range Status  11/27/2020 5.8 4.0 - 10.5 K/uL Final   RBC  Date Value Ref Range Status  11/27/2020 5.21 4.22 - 5.81 MIL/uL Final   Hemoglobin  Date Value Ref Range Status  12/23/2020 13.6 13.0 - 17.0 g/dL Final  03/12/2020 14.1 13.0 - 17.7 g/dL Final   HCT  Date Value Ref Range Status  12/23/2020 42.7 39.0 - 52.0 % Final    Comment:    Performed at [facility], [place] [address], [place], [place] [address]   Hematocrit  Date Value Ref Range Status  03/12/2020 40.6 37.5 - 51.0 % Final   MCHC  Date Value Ref Range Status  11/27/2020 31.6 30.0 - 36.0 g/dL Final   [facility]  Date Value Ref Range Status  11/27/2020 27.6 26.0 - 34.0 pg Final   MCV  Date Value Ref Range Status  11/27/2020 87.3 80.0 - 100.0 fL Final  03/12/2020 83 79 - 97 fL Final   No results found for: "PLTCOUNTKUC", "LABPLAT", "POCPLA" RDW  Date Value Ref Range Status  11/27/2020 14.8 11.5 - 15.5 % Final  03/12/2020 14.3 11.6 - 15.4 % Final         Passed - Valid encounter within last 12 months    Recent Outpatient Visits          7 months ago Essential hypertension   [place], MD   11 months ago  Primary hypertension   [place], Stephen L, RPH-CPP   1 year ago Primary hypertension   [place], Jarome Matin, RPH-CPP   1 year ago Medicare annual wellness visit, initial   [place], FNP   1 year ago Essential hypertension   [place], MD      Future Appointments            In 2 weeks Ladell Pier, MD [place]

## 2021-07-14 ENCOUNTER — Other Ambulatory Visit: Payer: Self-pay | Admitting: Internal Medicine

## 2021-07-14 DIAGNOSIS — M1A09X0 Idiopathic chronic gout, multiple sites, without tophus (tophi): Secondary | ICD-10-CM

## 2021-07-14 MED ORDER — COLCHICINE 0.6 MG PO TABS
0.6000 mg | ORAL_TABLET | Freq: Every day | ORAL | 0 refills | Status: DC
  Filled 2021-07-14: qty 30, 30d supply, fill #0

## 2021-07-15 ENCOUNTER — Other Ambulatory Visit: Payer: Self-pay

## 2021-07-27 ENCOUNTER — Encounter: Payer: Self-pay | Admitting: Internal Medicine

## 2021-07-27 ENCOUNTER — Other Ambulatory Visit: Payer: Self-pay | Admitting: Internal Medicine

## 2021-07-27 ENCOUNTER — Ambulatory Visit: Payer: Medicare HMO | Attending: Internal Medicine | Admitting: Internal Medicine

## 2021-07-27 ENCOUNTER — Other Ambulatory Visit: Payer: Self-pay

## 2021-07-27 VITALS — BP 115/79 | HR 67 | Wt 231.2 lb

## 2021-07-27 DIAGNOSIS — I10 Essential (primary) hypertension: Secondary | ICD-10-CM

## 2021-07-27 DIAGNOSIS — I700 Atherosclerosis of aorta: Secondary | ICD-10-CM | POA: Diagnosis not present

## 2021-07-27 DIAGNOSIS — Z1211 Encounter for screening for malignant neoplasm of colon: Secondary | ICD-10-CM

## 2021-07-27 DIAGNOSIS — L309 Dermatitis, unspecified: Secondary | ICD-10-CM | POA: Diagnosis not present

## 2021-07-27 DIAGNOSIS — I480 Paroxysmal atrial fibrillation: Secondary | ICD-10-CM | POA: Diagnosis not present

## 2021-07-27 DIAGNOSIS — M1A09X0 Idiopathic chronic gout, multiple sites, without tophus (tophi): Secondary | ICD-10-CM

## 2021-07-27 MED ORDER — TRIAMCINOLONE ACETONIDE 0.1 % EX CREA
1.0000 | TOPICAL_CREAM | Freq: Two times a day (BID) | CUTANEOUS | 0 refills | Status: DC
  Filled 2021-07-27: qty 30, 15d supply, fill #0

## 2021-07-27 MED ORDER — COLCHICINE 0.6 MG PO TABS
0.6000 mg | ORAL_TABLET | Freq: Every day | ORAL | 6 refills | Status: DC
  Filled 2021-07-27 – 2021-08-15 (×2): qty 30, 30d supply, fill #0
  Filled 2021-09-17: qty 30, 30d supply, fill #1
  Filled 2021-10-19: qty 4, 4d supply, fill #2
  Filled 2021-10-19: qty 26, 26d supply, fill #2
  Filled 2021-11-15: qty 30, 30d supply, fill #3
  Filled 2021-12-16: qty 30, 30d supply, fill #4
  Filled 2022-01-19: qty 30, 30d supply, fill #5
  Filled 2022-02-14: qty 30, 30d supply, fill #6

## 2021-07-27 MED ORDER — ALLOPURINOL 300 MG PO TABS
ORAL_TABLET | ORAL | 6 refills | Status: DC
  Filled 2021-07-27 – 2021-08-08 (×2): qty 30, 30d supply, fill #0
  Filled 2021-09-08: qty 30, 30d supply, fill #1
  Filled 2021-10-08: qty 30, 30d supply, fill #2
  Filled 2021-11-06: qty 30, 30d supply, fill #3
  Filled 2021-12-06: qty 30, 30d supply, fill #4
  Filled 2022-01-05: qty 30, 30d supply, fill #5
  Filled 2022-02-02: qty 30, 30d supply, fill #6

## 2021-07-27 NOTE — Progress Notes (Signed)
Patient ID: Albert Cueva., male    DOB: Jul 07, 1955  MRN: [id number]  CC: chronic ds management  Subjective: Albert Good is a 66 y.o. male who presents for chronic ds management His concerns today include:  Patient with history of PAF on Eliquis, HTN, obesity, BPH, CKD stage II, OSA on CPAP, chronic gout of multiple joints, diverticulosis, tubular adenomatous polyps, + clubbing, ED, testosterone def, elev PSA (bx in [place] around 2018.  Neg), aortic atherosclerosis on CT chest 09/2020  Pt is s/p radical RT. Healed well. Has f/u in 1 yr with urology Dr. Alinda Money.  Based on urology's note, I asked him about the left scrotal mass.  Patient states this has remained stable and is not bothersome to him and was previously evaluated in [place].. Creatinine had increased from 1.25 to 2.25 and GFR declined to 31 post nephrectomy.  HYPERTENSION/A.fib Currently taking: see medication list Metoprolol XL 100 mg, Hydralazine 10 mg BID, Cardizem 300 mg daily, Eliquis and Flecainide 50 mg daily.  Last saw cardiology PA 1 month ago. Med Adherence: '[x]'$  Yes    '[]'$  No Medication side effects: '[x]'$  Yes    '[]'$  No Adherence with salt restriction: '[x]'$  Yes    '[]'$  No Home Monitoring?: '[]'$  Yes    '[x]'$  No Monitoring Frequency:  Home BP results range:  SOB? '[]'$  Yes    '[x]'$  No Chest Pain?: '[]'$  Yes    '[x]'$  No Leg swelling?: '[]'$  Yes    '[x]'$  No Headaches?: '[]'$  Yes    '[x]'$  No Dizziness? '[]'$  Yes    '[x]'$  No Comments: He has not had any bruising or bleeding.  Occasional palpitations.  HL: Reports compliance with taking Lipitor.  Thinks he needs refills on his medications but refills were actually sent by the cardiology PA when he was seen 1 month ago.  He is noted some lumps on the DIP joints of fingers on the right hand over the past few months. No recent flare of gout.  Requesting refill on allopurinol and colchicine.  Complains of an itchy rash on the left forearm x1 month.  It goes and comes.  No initiating  factors.  Former smoker.  Incidental finding on low-dose CT scan of the chest for lung cancer screening done 09/2020 revealed aortic atherosclerosis.  HM: Due for Shingrix and pneumonia vaccine.  He defers on both of them today stating that he will get them at another time.  Tells me that he is due for repeat colonoscopy.  Gives history of colon polyps.  Last colonoscopy was 5 years ago in [place] and he was told that he will need it repeated in 5 years.    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   Aortic atherosclerosis ([place]) 11/27/2020   Renal mass 11/27/2020   Need for tetanus, diphtheria, and acellular pertussis (Tdap) vaccine 05/23/2020   Overweight (BMI 25.0-29.9) 01/24/2020   OSA (obstructive sleep apnea) 01/24/2020   History of colon polyps 01/24/2020   Chronic anticoagulation 03/28/2019   Paroxysmal atrial fibrillation ([place]) 03/12/2018   Essential hypertension 03/12/2018   BPH associated with nocturia 03/12/2018   Chronic gout of multiple sites 03/12/2018   Long-term use of aspirin therapy 03/12/2018   Chest pain 03/27/2017     Current Outpatient Medications on File Prior to Visit  Medication Sig Dispense Refill   apixaban (ELIQUIS) 5 MG TABS tablet Take 1 tablet (5 mg total) by mouth 2 (two) times daily. 180 tablet  3   atorvastatin (LIPITOR) 10 MG tablet Take 1 tablet (10 mg total) by mouth once daily. 90 tablet 3   diltiazem (CARDIZEM CD) 300 MG 24 hr capsule TAKE 1 CAPSULE (300 MG TOTAL) BY MOUTH DAILY. 90 capsule 3   flecainide (TAMBOCOR) 50 MG tablet Take 1 tablet (50 mg total) by mouth daily. 90 tablet 3   hydrALAZINE (APRESOLINE) 10 MG tablet Take 1 tablet (10 mg total) by mouth 2 (two) times daily. 180 tablet 3   metoprolol succinate (TOPROL-XL) 100 MG 24 hr tablet Take 1 tablet (100 mg total) by mouth daily. Take with or immediately following a meal. 90 tablet 3   tamsulosin (FLOMAX) 0.4 MG CAPS capsule TAKE 2  CAPSULES (0.8 MG TOTAL) BY MOUTH AT BEDTIME. 180 capsule 1   No current facility-administered medications on file prior to visit.    Allergies  Allergen Reactions   Other Hives    HAIR DYE    Social History   Socioeconomic History   Marital status: Single    Spouse name: Not on file   Number of children: Not on file   Years of education: Not on file   Highest education level: Not on file  Occupational History   Not on file  Tobacco Use   Smoking status: Former    Years: 30.00    Types: Cigarettes   Smokeless tobacco: Former    Types: Snuff    Quit date: 11/13/2020  Vaping Use   Vaping Use: Never used  Substance and Sexual Activity   Alcohol use: Yes    Comment: 2 every other day   Drug use: Not Currently   Sexual activity: Yes    Birth control/protection: None  Other Topics Concern   Not on file  Social History Narrative   Not on file   Social Determinants of Health   Financial Resource Strain: Medium Risk (08/04/2020)   Overall Financial Resource Strain (CARDIA)    Difficulty of Paying Living Expenses: Somewhat hard  Food Insecurity: No Food Insecurity (08/04/2020)   Hunger Vital Sign    Worried About Running Out of Food in the Last Year: Never true    Ran Out of Food in the Last Year: Never true  Transportation Needs: No Transportation Needs (08/04/2020)   PRAPARE - Hydrologist (Medical): No    Lack of Transportation (Non-Medical): No  Physical Activity: Insufficiently Active (08/04/2020)   Exercise Vital Sign    Days of Exercise per Week: 3 days    Minutes of Exercise per Session: 40 min  Stress: No Stress Concern Present (08/04/2020)   [place]    Feeling of Stress : Not at all  Social Connections: Socially Isolated (08/04/2020)   Social Connection and Isolation Panel [NHANES]    Frequency of Communication with Friends and Family: Twice a week    Frequency of  Social Gatherings with Friends and Family: Once a week    Attends Religious Services: Never    Marine scientist or Organizations: No    Attends Archivist Meetings: Never    Marital Status: Never married  Intimate Partner Violence: Not At Risk (08/04/2020)   Humiliation, Afraid, Rape, and Kick questionnaire    Fear of Current or Ex-Partner: No    Emotionally Abused: No    Physically Abused: No    Sexually Abused: No    Family History  Problem Relation Age of Onset  Diabetes Mother    Hypertension Father    Diabetes Sister    Diabetes Brother    Heart failure Brother    Kidney failure Brother     Past Surgical History:  Procedure Laterality Date   LIPOSUCTION     ROBOTIC ASSITED PARTIAL NEPHRECTOMY Right 12/22/2020   Procedure: XI ROBOTIC ASSITED  NEPHRECTOMY;  Surgeon: Raynelle Bring, MD;  Location: [facility];  Service: Urology;  Laterality: Right;    ROS: Review of Systems Negative except as stated above  PHYSICAL EXAM: BP 115/79   Pulse 67   Wt 231 lb 3.2 oz (104.9 kg)   SpO2 97%   BMI 29.68 kg/m   Physical Exam  General appearance - alert, well appearing, pleasant older African-American male and in no distress Mental status - normal mood, behavior, speech, dress, motor activity, and thought processes Neck - supple, no significant adenopathy Chest - clear to auscultation, no wheezes, rales or rhonchi, symmetric air entry Heart - normal rate, regular rhythm, normal S1, S2, no murmurs, rubs, clicks or gallops Extremities - peripheral pulses normal, no pedal edema, no clubbing or cyanosis Skin: Small patch of dry scaly rash noted on the left forearm close to the antecubital fossa. MSK: He has [place]ber[place] node noted on the right fourth and middle finger DIP joints.     Latest Ref Rng & Units 12/24/2020    4:05 AM 12/23/2020    1:46 PM 12/23/2020    4:11 AM  CMP  Glucose 70 - 99 mg/dL 105  158  168   BUN 8 - 23 mg/dL '21  18  16   '$ Creatinine 0.61 -  1.24 mg/dL 2.25  2.37  2.08   Sodium 135 - 145 mmol/L 139  134  135   Potassium 3.5 - 5.1 mmol/L 4.4  3.7  4.6   Chloride 98 - 111 mmol/L 104  99  99   CO2 22 - 32 mmol/L '27  26  26   '$ Calcium 8.9 - 10.3 mg/dL 8.4  8.1  8.5    Lipid Panel     Component Value Date/Time   CHOL 162 11/27/2020 1059   TRIG 78 11/27/2020 1059   HDL 65 11/27/2020 1059   CHOLHDL 2.5 11/27/2020 1059   LDLCALC 82 11/27/2020 1059    CBC    Component Value Date/Time   WBC 5.8 11/27/2020 0805   RBC 5.21 11/27/2020 0805   HGB 13.6 12/23/2020 0411   HGB 14.1 03/12/2020 1533   HCT 42.7 12/23/2020 0411   HCT 40.6 03/12/2020 1533   PLT 282 11/27/2020 0805   PLT 199 03/12/2020 1533   MCV 87.3 11/27/2020 0805   MCV 83 03/12/2020 1533   MCH 27.6 11/27/2020 0805   MCHC 31.6 11/27/2020 0805   RDW 14.8 11/27/2020 0805   RDW 14.3 03/12/2020 1533   LYMPHSABS 2.9 09/28/2018 1000   EOSABS 0.2 09/28/2018 1000   BASOSABS 0.1 09/28/2018 1000    ASSESSMENT AND PLAN:  1. Essential hypertension At goal.  Continue hydralazine 10 mg twice a day and Toprol-XL daily. - CBC - Comprehensive metabolic panel  2. Paroxysmal atrial fibrillation (HCC) Stable and sounds to be in SR today. Continue Cardizem, Toprol, Flecainide And Eliquis.  Followed by cardiology  3. Aortic atherosclerosis (HCC) Continue atorvastatin - Lipid panel  4. Dermatitis - triamcinolone cream (KENALOG) 0.1 %; Apply 1 Application topically 2 (two) times daily.  Dispense: 30 g; Refill: 0  5. Chronic gout of multiple sites, unspecified cause Stable  without recent flare.  We will check uric acid level on next visit. - allopurinol (ZYLOPRIM) 300 MG tablet; TAKE 1 TABLET (300 MG TOTAL) BY MOUTH DAILY. TO TREAT GOUT  Dispense: 30 tablet; Refill: 6 - colchicine 0.6 MG tablet; Take 1 tablet (0.6 mg total) by mouth daily.  Dispense: 30 tablet; Refill: 6  6. Screening for colon cancer - Ambulatory referral to Gastroenterology    Patient was given  the opportunity to ask questions.  Patient verbalized understanding of the plan and was able to repeat key elements of the plan.   This documentation was completed using Radio producer.  Any transcriptional errors are unintentional.  Orders Placed This Encounter  Procedures   CBC   Comprehensive metabolic panel   Lipid panel   Ambulatory referral to Gastroenterology      Requested Prescriptions   Signed Prescriptions Disp Refills   allopurinol (ZYLOPRIM) 300 MG tablet 30 tablet 6    Sig: TAKE 1 TABLET (300 MG TOTAL) BY MOUTH DAILY. TO TREAT GOUT   colchicine 0.6 MG tablet 30 tablet 6    Sig: Take 1 tablet (0.6 mg total) by mouth daily.   triamcinolone cream (KENALOG) 0.1 % 30 g 0    Sig: Apply 1 Application topically 2 (two) times daily.    Return in about 3 months (around 10/27/2021).  Karle Plumber, MD, FACP

## 2021-07-27 NOTE — Progress Notes (Deleted)
Patient ID: Albert Good., male    DOB: 1955/09/10  MRN: [id number]  CC: chronic ds management  Subjective: Albert Good is a 66 y.o. male who presents for chronic ds management His concerns today include:  Patient with history of PAF on Eliquis, HTN, obesity, BPH, CKD stage II, OSA on CPAP, chronic gout of multiple joints, diverticulosis, tubular adenomatous polyps, + clubbing, ED, testosterone def, elev PSA (bx in [place] around 2018.  Neg), aortic atherosclerosis on CT chest 09/2020  Pt is s/p radical RT. Healed well. Has f/u in 1 yr with urology Dr. Alinda Good.  Based on urology's note, I asked him about the left scrotal mass.  Patient states this has remained stable and is not bothersome to him and was previously evaluated in [place].. Creatinine had increased from 1.25 to 2.25 and GFR declined to 31 post nephrectomy.  HYPERTENSION/A.fib Currently taking: see medication list Metoprolol XL 100 mg, Hydralazine 10 mg BID, Cardizem 300 mg daily, Eliquis and Flecainide 50 mg daily.  Last saw cardiology PA 1 month ago. Med Adherence: '[x]'$  Yes    '[]'$  No Medication side effects: '[x]'$  Yes    '[]'$  No Adherence with salt restriction: '[x]'$  Yes    '[]'$  No Home Monitoring?: '[]'$  Yes    '[x]'$  No Monitoring Frequency:  Home BP results range:  SOB? '[]'$  Yes    '[x]'$  No Chest Pain?: '[]'$  Yes    '[x]'$  No Leg swelling?: '[]'$  Yes    '[x]'$  No Headaches?: '[]'$  Yes    '[x]'$  No Dizziness? '[]'$  Yes    '[x]'$  No Comments: He has not had any bruising or bleeding.  Occasional palpitations.  HL: Reports compliance with taking Lipitor.  Thinks he needs refills on his medications but refills were actually sent by the cardiology PA when he was seen 1 month ago.  He is noted some lumps on the DIP joints of fingers on the right hand over the past few months. No recent flare of gout.  Requesting refill on allopurinol and colchicine.  Complains of an itchy rash on the left forearm x1 month.  It goes and comes.  No initiating  factors.  Former smoker.  Incidental finding on low-dose CT scan of the chest for lung cancer screening done 09/2020 revealed aortic atherosclerosis.  HM: Due for Shingrix and pneumonia vaccine.  He defers on both of them today stating that he will get them at another time.  Tells me that he is due for repeat colonoscopy.  Gives history of colon polyps.  Last colonoscopy was 5 years ago in [place] and he was told that he will need it repeated in 5 years.    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   Aortic atherosclerosis ([place]) 11/27/2020   Renal mass 11/27/2020   Need for tetanus, diphtheria, and acellular pertussis (Tdap) vaccine 05/23/2020   Overweight (BMI 25.0-29.9) 01/24/2020   OSA (obstructive sleep apnea) 01/24/2020   History of colon polyps 01/24/2020   Chronic anticoagulation 03/28/2019   Paroxysmal atrial fibrillation ([place]) 03/12/2018   Essential hypertension 03/12/2018   BPH associated with nocturia 03/12/2018   Chronic gout of multiple sites 03/12/2018   Long-term use of aspirin therapy 03/12/2018   Chest pain 03/27/2017     Current Outpatient Medications on File Prior to Visit  Medication Sig Dispense Refill   apixaban (ELIQUIS) 5 MG TABS tablet Take 1 tablet (5 mg total) by mouth 2 (two) times daily. 180 tablet  3   atorvastatin (LIPITOR) 10 MG tablet Take 1 tablet (10 mg total) by mouth once daily. 90 tablet 3   diltiazem (CARDIZEM CD) 300 MG 24 hr capsule TAKE 1 CAPSULE (300 MG TOTAL) BY MOUTH DAILY. 90 capsule 3   flecainide (TAMBOCOR) 50 MG tablet Take 1 tablet (50 mg total) by mouth daily. 90 tablet 3   hydrALAZINE (APRESOLINE) 10 MG tablet Take 1 tablet (10 mg total) by mouth 2 (two) times daily. 180 tablet 3   metoprolol succinate (TOPROL-XL) 100 MG 24 hr tablet Take 1 tablet (100 mg total) by mouth daily. Take with or immediately following a meal. 90 tablet 3   tamsulosin (FLOMAX) 0.4 MG CAPS capsule TAKE 2  CAPSULES (0.8 MG TOTAL) BY MOUTH AT BEDTIME. 180 capsule 1   No current facility-administered medications on file prior to visit.    Allergies  Allergen Reactions   Other Hives    HAIR DYE    Social History   Socioeconomic History   Marital status: Single    Spouse name: Not on file   Number of children: Not on file   Years of education: Not on file   Highest education level: Not on file  Occupational History   Not on file  Tobacco Use   Smoking status: Former    Years: 30.00    Types: Cigarettes   Smokeless tobacco: Former    Types: Snuff    Quit date: 11/13/2020  Vaping Use   Vaping Use: Never used  Substance and Sexual Activity   Alcohol use: Yes    Comment: 2 every other day   Drug use: Not Currently   Sexual activity: Yes    Birth control/protection: None  Other Topics Concern   Not on file  Social History Narrative   Not on file   Social Determinants of Health   Financial Resource Strain: Medium Risk (08/04/2020)   Overall Financial Resource Strain (CARDIA)    Difficulty of Paying Living Expenses: Somewhat hard  Food Insecurity: No Food Insecurity (08/04/2020)   Hunger Vital Sign    Worried About Running Out of Food in the Last Year: Never true    Ran Out of Food in the Last Year: Never true  Transportation Needs: No Transportation Needs (08/04/2020)   PRAPARE - Hydrologist (Medical): No    Lack of Transportation (Non-Medical): No  Physical Activity: Insufficiently Active (08/04/2020)   Exercise Vital Sign    Days of Exercise per Week: 3 days    Minutes of Exercise per Session: 40 min  Stress: No Stress Concern Present (08/04/2020)   Albert Good    Feeling of Stress : Not at all  Social Connections: Socially Isolated (08/04/2020)   Social Connection and Isolation Panel [NHANES]    Frequency of Communication with Friends and Family: Twice a week    Frequency of  Social Gatherings with Friends and Family: Once a week    Attends Religious Services: Never    Marine scientist or Organizations: No    Attends Archivist Meetings: Never    Marital Status: Never married  Intimate Partner Violence: Not At Risk (08/04/2020)   Humiliation, Afraid, Rape, and Kick questionnaire    Fear of Current or Ex-Partner: No    Emotionally Abused: No    Physically Abused: No    Sexually Abused: No    Family History  Problem Relation Age of Onset  Diabetes Mother    Hypertension Father    Diabetes Sister    Diabetes Brother    Heart failure Brother    Kidney failure Brother     Past Surgical History:  Procedure Laterality Date   LIPOSUCTION     ROBOTIC ASSITED PARTIAL NEPHRECTOMY Right 12/22/2020   Procedure: XI ROBOTIC ASSITED  NEPHRECTOMY;  Surgeon: Raynelle Bring, MD;  Location: [facility];  Service: Urology;  Laterality: Right;    ROS: Review of Systems Negative except as stated above  PHYSICAL EXAM: BP 115/79   Pulse 67   Wt 231 lb 3.2 oz (104.9 kg)   SpO2 97%   BMI 29.68 kg/m   Physical Exam  General appearance - alert, well appearing, pleasant older African-American male and in no distress Mental status - normal mood, behavior, speech, dress, motor activity, and thought processes Neck - supple, no significant adenopathy Chest - clear to auscultation, no wheezes, rales or rhonchi, symmetric air entry Heart - normal rate, regular rhythm, normal S1, S2, no murmurs, rubs, clicks or gallops Extremities - peripheral pulses normal, no pedal edema, no clubbing or cyanosis Skin: Small patch of dry scaly rash noted on the left forearm close to the antecubital fossa. MSK: He has [place]ber[place] node noted on the right fourth and middle finger DIP joints.     Latest Ref Rng & Units 12/24/2020    4:05 AM 12/23/2020    1:46 PM 12/23/2020    4:11 AM  CMP  Glucose 70 - 99 mg/dL 105  158  168   BUN 8 - 23 mg/dL '21  18  16   '$ Creatinine 0.61 -  1.24 mg/dL 2.25  2.37  2.08   Sodium 135 - 145 mmol/L 139  134  135   Potassium 3.5 - 5.1 mmol/L 4.4  3.7  4.6   Chloride 98 - 111 mmol/L 104  99  99   CO2 22 - 32 mmol/L '27  26  26   '$ Calcium 8.9 - 10.3 mg/dL 8.4  8.1  8.5    Lipid Panel     Component Value Date/Time   CHOL 162 11/27/2020 1059   TRIG 78 11/27/2020 1059   HDL 65 11/27/2020 1059   CHOLHDL 2.5 11/27/2020 1059   LDLCALC 82 11/27/2020 1059    CBC    Component Value Date/Time   WBC 5.8 11/27/2020 0805   RBC 5.21 11/27/2020 0805   HGB 13.6 12/23/2020 0411   HGB 14.1 03/12/2020 1533   HCT 42.7 12/23/2020 0411   HCT 40.6 03/12/2020 1533   PLT 282 11/27/2020 0805   PLT 199 03/12/2020 1533   MCV 87.3 11/27/2020 0805   MCV 83 03/12/2020 1533   MCH 27.6 11/27/2020 0805   MCHC 31.6 11/27/2020 0805   RDW 14.8 11/27/2020 0805   RDW 14.3 03/12/2020 1533   LYMPHSABS 2.9 09/28/2018 1000   EOSABS 0.2 09/28/2018 1000   BASOSABS 0.1 09/28/2018 1000    ASSESSMENT AND PLAN:  1. Essential hypertension At goal.  Continue hydralazine 10 mg twice a day and Toprol-XL daily. - CBC - Comprehensive metabolic panel  2. Paroxysmal atrial fibrillation (HCC) Stable and sounds to be in SR today. Continue Cardizem, Toprol, Flecainide And Eliquis.  Followed by cardiology  3. Aortic atherosclerosis (HCC) Continue atorvastatin - Lipid panel  4. Dermatitis - triamcinolone cream (KENALOG) 0.1 %; Apply 1 Application topically 2 (two) times daily.  Dispense: 30 g; Refill: 0  5. Chronic gout of multiple sites, unspecified cause Stable  without recent flare.  We will check uric acid level on next visit. - allopurinol (ZYLOPRIM) 300 MG tablet; TAKE 1 TABLET (300 MG TOTAL) BY MOUTH DAILY. TO TREAT GOUT  Dispense: 30 tablet; Refill: 6 - colchicine 0.6 MG tablet; Take 1 tablet (0.6 mg total) by mouth daily.  Dispense: 30 tablet; Refill: 6  6. Screening for colon cancer - Ambulatory referral to Gastroenterology    Patient was given  the opportunity to ask questions.  Patient verbalized understanding of the plan and was able to repeat key elements of the plan.   This documentation was completed using Radio producer.  Any transcriptional errors are unintentional.  Orders Placed This Encounter  Procedures   CBC   Comprehensive metabolic panel   Lipid panel      Requested Prescriptions   Signed Prescriptions Disp Refills   allopurinol (ZYLOPRIM) 300 MG tablet 30 tablet 6    Sig: TAKE 1 TABLET (300 MG TOTAL) BY MOUTH DAILY. TO TREAT GOUT   colchicine 0.6 MG tablet 30 tablet 6    Sig: Take 1 tablet (0.6 mg total) by mouth daily.   triamcinolone cream (KENALOG) 0.1 % 30 g 0    Sig: Apply 1 Application topically 2 (two) times daily.    Return in about 3 months (around 10/27/2021).  Karle Plumber, MD, FACP

## 2021-07-28 ENCOUNTER — Other Ambulatory Visit: Payer: Self-pay

## 2021-07-28 LAB — LIPID PANEL
Chol/HDL Ratio: 1.9 ratio (ref 0.0–5.0)
Cholesterol, Total: 133 mg/dL (ref 100–199)
HDL: 69 mg/dL (ref >39)
LDL Chol Calc (NIH): 50 mg/dL (ref 0–99)
Triglycerides: 67 mg/dL (ref 0–149)
VLDL Cholesterol Cal: 14 mg/dL (ref 5–40)

## 2021-07-28 LAB — CBC
Hematocrit: 41.4 % (ref 37.5–51.0)
Hemoglobin: 13.6 g/dL (ref 13.0–17.7)
MCH: 28.2 pg (ref 26.6–33.0)
MCHC: 32.9 g/dL (ref 31.5–35.7)
MCV: 86 fL (ref 79–97)
Platelets: 203 10*3/uL (ref 150–450)
RBC: 4.82 x10E6/uL (ref 4.14–5.80)
RDW: 14.4 % (ref 11.6–15.4)
WBC: 6.1 10*3/uL (ref 3.4–10.8)

## 2021-07-28 LAB — COMPREHENSIVE METABOLIC PANEL
ALT: 19 IU/L (ref 0–44)
AST: 23 IU/L (ref 0–40)
Albumin/Globulin Ratio: 2.1 (ref 1.2–2.2)
Albumin: 4.5 g/dL (ref 3.9–4.9)
Alkaline Phosphatase: 82 IU/L (ref 44–121)
BUN/Creatinine Ratio: 13 (ref 10–24)
BUN: 21 mg/dL (ref 8–27)
Bilirubin Total: 0.3 mg/dL (ref 0.0–1.2)
CO2: 22 mmol/L (ref 20–29)
Calcium: 9.4 mg/dL (ref 8.6–10.2)
Chloride: 106 mmol/L (ref 96–106)
Creatinine, Ser: 1.66 mg/dL — ABNORMAL HIGH (ref 0.76–1.27)
Globulin, Total: 2.1 g/dL (ref 1.5–4.5)
Glucose: 88 mg/dL (ref 70–99)
Potassium: 4.6 mmol/L (ref 3.5–5.2)
Sodium: 140 mmol/L (ref 134–144)
Total Protein: 6.6 g/dL (ref 6.0–8.5)
eGFR: 45 mL/min/{1.73_m2} — ABNORMAL LOW (ref >59)

## 2021-07-28 MED ORDER — ATORVASTATIN CALCIUM 10 MG PO TABS
10.0000 mg | ORAL_TABLET | Freq: Every day | ORAL | 3 refills | Status: DC
  Filled 2021-07-28: qty 90, 90d supply, fill #0

## 2021-07-28 MED ORDER — HYDRALAZINE HCL 10 MG PO TABS
10.0000 mg | ORAL_TABLET | Freq: Two times a day (BID) | ORAL | 3 refills | Status: DC
  Filled 2021-07-28: qty 180, 90d supply, fill #0
  Filled 2021-10-19: qty 180, 90d supply, fill #1

## 2021-07-28 NOTE — Telephone Encounter (Signed)
Requested Prescriptions  Pending Prescriptions Disp Refills  . hydrALAZINE (APRESOLINE) 10 MG tablet 180 tablet 3    Sig: Take 1 tablet (10 mg total) by mouth 2 (two) times daily.     Cardiovascular:  Vasodilators Failed - 07/27/2021 12:30 PM      Failed - ANA Screen, Ifa, Serum in normal range and within 360 days    No results found for: "ANA", "ANATITER", "LABANTI"       Passed - HCT in normal range and within 360 days    Hematocrit  Date Value Ref Range Status  07/27/2021 41.4 37.5 - 51.0 % Final         Passed - HGB in normal range and within 360 days    Hemoglobin  Date Value Ref Range Status  07/27/2021 13.6 13.0 - 17.7 g/dL Final         Passed - RBC in normal range and within 360 days    RBC  Date Value Ref Range Status  07/27/2021 4.82 4.14 - 5.80 x10E6/uL Final  11/27/2020 5.21 4.22 - 5.81 MIL/uL Final         Passed - WBC in normal range and within 360 days    WBC  Date Value Ref Range Status  07/27/2021 6.1 3.4 - 10.8 x10E3/uL Final  11/27/2020 5.8 4.0 - 10.5 K/uL Final         Passed - PLT in normal range and within 360 days    Platelets  Date Value Ref Range Status  07/27/2021 203 150 - 450 x10E3/uL Final         Passed - Last BP in normal range    BP Readings from Last 1 Encounters:  07/27/21 115/79         Passed - Valid encounter within last 12 months    Recent Outpatient Visits          Yesterday Essential hypertension   [place], Deborah B, MD   8 months ago Essential hypertension   [place], MD   11 months ago Primary hypertension   [place], Stephen L, RPH-CPP   1 year ago Primary hypertension   [place], Jarome Matin, RPH-CPP   1 year ago Medicare annual wellness visit, initial   [place], Carroll Sage, [place]              . atorvastatin (LIPITOR) 10 MG tablet 90 tablet 3    Sig: Take 1 tablet (10 mg total) by mouth once daily.     Cardiovascular:  Antilipid - Statins Failed - 07/27/2021 12:30 PM      Failed - Lipid Panel in normal range within the last 12 months    Cholesterol, Total  Date Value Ref Range Status  07/27/2021 133 100 - 199 mg/dL Final   LDL Chol Calc (NIH)  Date Value Ref Range Status  07/27/2021 50 0 - 99 mg/dL Final   HDL  Date Value Ref Range Status  07/27/2021 69 >39 mg/dL Final   Triglycerides  Date Value Ref Range Status  07/27/2021 67 0 - 149 mg/dL Final         Passed - Patient is not pregnant      Passed - Valid encounter within last 12 months    Recent Outpatient Visits  Yesterday Essential hypertension   [place], MD   8 months ago Essential hypertension   [place], MD   11 months ago Primary hypertension   [place], Jarome Matin, RPH-CPP   1 year ago Primary hypertension   [place], Jarome Matin, RPH-CPP   1 year ago Medicare annual wellness visit, initial   [place], [place]

## 2021-07-29 ENCOUNTER — Other Ambulatory Visit: Payer: Self-pay

## 2021-07-30 DIAGNOSIS — G4733 Obstructive sleep apnea (adult) (pediatric): Secondary | ICD-10-CM | POA: Diagnosis not present

## 2021-08-10 ENCOUNTER — Other Ambulatory Visit: Payer: Self-pay

## 2021-08-15 ENCOUNTER — Other Ambulatory Visit: Payer: Self-pay | Admitting: Internal Medicine

## 2021-08-15 DIAGNOSIS — N401 Enlarged prostate with lower urinary tract symptoms: Secondary | ICD-10-CM

## 2021-08-17 ENCOUNTER — Other Ambulatory Visit: Payer: Self-pay

## 2021-08-17 MED ORDER — TAMSULOSIN HCL 0.4 MG PO CAPS
ORAL_CAPSULE | ORAL | 0 refills | Status: DC
  Filled 2021-08-17: qty 180, 90d supply, fill #0

## 2021-08-17 NOTE — Telephone Encounter (Signed)
Requested Prescriptions  Pending Prescriptions Disp Refills  . tamsulosin (FLOMAX) 0.4 MG CAPS capsule 180 capsule 0    Sig: TAKE 2 CAPSULES (0.8 MG TOTAL) BY MOUTH AT BEDTIME.     Urology: Alpha-Adrenergic Blocker Failed - 08/15/2021  9:26 AM      Failed - PSA in normal range and within 360 days    No results found for: "LABPSA", "PSA", "PSA1", "ULTRAPSA"       Passed - Last BP in normal range    BP Readings from Last 1 Encounters:  07/27/21 115/79         Passed - Valid encounter within last 12 months    Recent Outpatient Visits          3 weeks ago Essential hypertension   [place], MD   8 months ago Essential hypertension   [place], Deborah B, MD   1 year ago Primary hypertension   [place], Pinch, RPH-CPP   1 year ago Primary hypertension   [place], Jarome Matin, RPH-CPP   1 year ago Medicare annual wellness visit, initial   [place], [place]

## 2021-08-30 DIAGNOSIS — G4733 Obstructive sleep apnea (adult) (pediatric): Secondary | ICD-10-CM | POA: Diagnosis not present

## 2021-09-09 ENCOUNTER — Other Ambulatory Visit: Payer: Self-pay

## 2021-09-11 ENCOUNTER — Other Ambulatory Visit: Payer: Self-pay

## 2021-09-17 ENCOUNTER — Other Ambulatory Visit: Payer: Self-pay

## 2021-09-24 ENCOUNTER — Ambulatory Visit
Admission: RE | Admit: 2021-09-24 | Discharge: 2021-09-24 | Disposition: A | Discharge status: Home / Self Care | Payer: Medicare HMO | Source: Ambulatory Visit | Attending: Internal Medicine | Admitting: Internal Medicine

## 2021-09-24 DIAGNOSIS — J432 Centrilobular emphysema: Secondary | ICD-10-CM | POA: Diagnosis not present

## 2021-09-24 DIAGNOSIS — Z87891 Personal history of nicotine dependence: Secondary | ICD-10-CM

## 2021-09-24 DIAGNOSIS — I700 Atherosclerosis of aorta: Secondary | ICD-10-CM | POA: Diagnosis not present

## 2021-09-24 DIAGNOSIS — I2510 Atherosclerotic heart disease of native coronary artery without angina pectoris: Secondary | ICD-10-CM | POA: Diagnosis not present

## 2021-09-30 DIAGNOSIS — G4733 Obstructive sleep apnea (adult) (pediatric): Secondary | ICD-10-CM | POA: Diagnosis not present

## 2021-10-09 ENCOUNTER — Other Ambulatory Visit: Payer: Self-pay

## 2021-10-19 ENCOUNTER — Other Ambulatory Visit: Payer: Self-pay

## 2021-11-02 DIAGNOSIS — G4733 Obstructive sleep apnea (adult) (pediatric): Secondary | ICD-10-CM | POA: Diagnosis not present

## 2021-11-06 ENCOUNTER — Other Ambulatory Visit: Payer: Self-pay

## 2021-11-06 ENCOUNTER — Other Ambulatory Visit: Payer: Self-pay | Admitting: Internal Medicine

## 2021-11-06 DIAGNOSIS — N401 Enlarged prostate with lower urinary tract symptoms: Secondary | ICD-10-CM

## 2021-11-06 MED ORDER — TAMSULOSIN HCL 0.4 MG PO CAPS
ORAL_CAPSULE | ORAL | 0 refills | Status: DC
  Filled 2021-11-06: qty 180, 90d supply, fill #0

## 2021-11-10 ENCOUNTER — Other Ambulatory Visit: Payer: Self-pay

## 2021-11-16 ENCOUNTER — Other Ambulatory Visit: Payer: Self-pay

## 2021-11-16 ENCOUNTER — Encounter: Payer: Self-pay | Admitting: Internal Medicine

## 2021-11-17 ENCOUNTER — Other Ambulatory Visit: Payer: Self-pay

## 2021-11-30 ENCOUNTER — Ambulatory Visit: Payer: Medicare HMO | Attending: Internal Medicine | Admitting: Internal Medicine

## 2021-11-30 ENCOUNTER — Encounter: Payer: Self-pay | Admitting: Internal Medicine

## 2021-11-30 VITALS — BP 146/89 | HR 97 | Temp 97.7°F | Ht 74.0 in | Wt 237.0 lb

## 2021-11-30 DIAGNOSIS — N1831 Chronic kidney disease, stage 3a: Secondary | ICD-10-CM

## 2021-11-30 DIAGNOSIS — E7800 Pure hypercholesterolemia, unspecified: Secondary | ICD-10-CM

## 2021-11-30 DIAGNOSIS — G4733 Obstructive sleep apnea (adult) (pediatric): Secondary | ICD-10-CM | POA: Diagnosis not present

## 2021-11-30 DIAGNOSIS — Z23 Encounter for immunization: Secondary | ICD-10-CM | POA: Diagnosis not present

## 2021-11-30 DIAGNOSIS — I10 Essential (primary) hypertension: Secondary | ICD-10-CM

## 2021-11-30 DIAGNOSIS — Z1211 Encounter for screening for malignant neoplasm of colon: Secondary | ICD-10-CM

## 2021-11-30 DIAGNOSIS — I480 Paroxysmal atrial fibrillation: Secondary | ICD-10-CM

## 2021-11-30 DIAGNOSIS — M19041 Primary osteoarthritis, right hand: Secondary | ICD-10-CM | POA: Diagnosis not present

## 2021-11-30 MED ORDER — ZOSTER VAC RECOMB ADJUVANTED 50 MCG/0.5ML IM SUSR: 0.5000 mL | Freq: Once | INTRAMUSCULAR | 0 refills | Status: AC

## 2021-11-30 NOTE — Progress Notes (Signed)
Patient ID: [name], male    DOB: Oct 01, 1955  MRN: [id number]  CC: Hypertension (Htn f/u.Marland Kitchen [place] [facility] colonoscopy. Karen Kays to bend fingers on R hand, bumps on the joints. /No to flu vax.)   Subjective: Albert Good is a 66 y.o. 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  Neg), aortic atherosclerosis on CT chest 09/2020   HYPERTENSION/A.fib Currently taking: see medication list Metoprolol XL 100 mg, Hydralazine 10 mg BID, Cardizem 300 mg daily, Eliquis and Flecainide 50 mg daily.   Reports compliance with medication and took already for today. No bruising or bleeding on Eliquis. Checks BP 1-2x/wk.  Gives range 120s/80.  Last checked 1 wk ago We have been keeping an eye on his kidney function post right nephrectomy in December of last year.  We checked GFR on his last visit 4 months ago and it was 45.  Creatinine had improved to 1.66.  HL: Reports compliance with atorvastatin.  Last LDL was 50.  Gout: No recent flareup.  Taking and tolerating allopurinol 300 mg daily and colchicine. On last visit, he complained of bumps on his fingers.  On exam he was noted to have Heberden's nodes noted on the right fourth and middle finger at the DIP joints.  Has not increased in size but end of middle finger feels tight when he bends it  OSA:  using CPAP machine consistently unless he falls asleep   HM:  due for colonoscopy.  Albert Good has been trying to reach him.  Due for flu shot, PCV 23 and shingles vaccine.   Patient Active Problem List   Diagnosis Date Noted    Neoplasm of right kidney 12/22/2020   Aortic atherosclerosis ([place]) 11/27/2020   Renal mass 11/27/2020   Need for tetanus, diphtheria, and acellular pertussis (Tdap) vaccine 05/23/2020   Overweight (BMI 25.0-29.9) 01/24/2020   OSA (obstructive sleep apnea) 01/24/2020   History of colon polyps 01/24/2020   Chronic anticoagulation 03/28/2019   Paroxysmal atrial fibrillation ([place]) 03/12/2018   Essential hypertension 03/12/2018   BPH associated with nocturia 03/12/2018   Chronic gout of multiple sites 03/12/2018   Long-term use of aspirin therapy 03/12/2018   Chest pain 03/27/2017     Current Outpatient Medications on File Prior to Visit  Medication Sig Dispense Refill   allopurinol (ZYLOPRIM) 300 MG tablet TAKE 1 TABLET (300 MG TOTAL) BY MOUTH DAILY. TO TREAT GOUT 30 tablet 6   apixaban (ELIQUIS) 5 MG TABS tablet Take 1 tablet (5 mg total) by mouth 2 (two) times daily. 180 tablet 3   atorvastatin (LIPITOR) 10 MG tablet Take 1 tablet (10 mg total) by mouth once daily. 90 tablet 3   atorvastatin (LIPITOR) 10 MG tablet Take 1 tablet (10 mg total) by mouth once daily. 90 tablet 3   colchicine 0.6 MG tablet Take 1 tablet (0.6 mg total) by mouth daily. 30 tablet 6   diltiazem (CARDIZEM CD) 300 MG 24 hr capsule TAKE 1 CAPSULE (300 MG TOTAL) BY MOUTH DAILY. 90 capsule 3   flecainide (TAMBOCOR) 50 MG tablet Take 1 tablet (50 mg total) by mouth daily. 90 tablet 3  hydrALAZINE (APRESOLINE) 10 MG tablet Take 1 tablet (10 mg total) by mouth 2 (two) times daily. 180 tablet 3   hydrALAZINE (APRESOLINE) 10 MG tablet Take 1 tablet (10 mg total) by mouth 2 (two) times daily. 180 tablet 3   metoprolol succinate (TOPROL-XL) 100 MG 24 hr tablet Take 1 tablet (100 mg total) by mouth daily. Take with or immediately following a meal. 90 tablet 3   tamsulosin (FLOMAX) 0.4 MG CAPS capsule TAKE 2 CAPSULES (0.8 MG TOTAL) BY MOUTH AT BEDTIME. 180 capsule 0   triamcinolone cream (KENALOG) 0.1 % Apply 1 Application  topically 2 (two) times daily. (Patient not taking: Reported on 11/30/2021) 30 g 0   No current facility-administered medications on file prior to visit.    Allergies  Allergen Reactions   Other Hives    HAIR DYE    Social History   Socioeconomic History   Marital status: Single    Spouse name: Not on file   Number of children: Not on file   Years of education: Not on file   Highest education level: Not on file  Occupational History   Not on file  Tobacco Use   Smoking status: Former    Years: 30.00    Types: Cigarettes   Smokeless tobacco: Former    Types: Snuff    Quit date: 11/13/2020  Vaping Use   Vaping Use: Never used  Substance and Sexual Activity   Alcohol use: Yes    Comment: 2 every other day   Drug use: Not Currently   Sexual activity: Yes    Birth control/protection: None  Other Topics Concern   Not on file  Social History Narrative   Not on file   Social Determinants of Health   Financial Resource Strain: Medium Risk (08/04/2020)   Overall Financial Resource Strain (CARDIA)    Difficulty of Paying Living Expenses: Somewhat hard  Food Insecurity: No Food Insecurity (08/04/2020)   Hunger Vital Sign    Worried About Running Out of Food in the Last Year: Never true    Ran Out of Food in the Last Year: Never true  Transportation Needs: No Transportation Needs (08/04/2020)   PRAPARE - Hydrologist (Medical): No    Lack of Transportation (Non-Medical): No  Physical Activity: Insufficiently Active (08/04/2020)   Exercise Vital Sign    Days of Exercise per Week: 3 days    Minutes of Exercise per Session: 40 min  Stress: No Stress Concern Present (08/04/2020)   Momence    Feeling of Stress : Not at all  Social Connections: Socially Isolated (08/04/2020)   Social Connection and Isolation Panel [NHANES]    Frequency of Communication with Friends and Family: Twice a  week    Frequency of Social Gatherings with Friends and Family: Once a week    Attends Religious Services: Never    Marine scientist or Organizations: No    Attends Archivist Meetings: Never    Marital Status: Never married  Intimate Partner Violence: Not At Risk (08/04/2020)   Humiliation, Afraid, Rape, and Kick questionnaire    Fear of Current or Ex-Partner: No    Emotionally Abused: No    Physically Abused: No    Sexually Abused: No    Family History  Problem Relation Age of Onset   Diabetes Mother    Hypertension Father    Diabetes Sister    Diabetes Brother  Heart failure Brother    Kidney failure Brother     Past Surgical History:  Procedure Laterality Date   LIPOSUCTION     ROBOTIC ASSITED PARTIAL NEPHRECTOMY Right 12/22/2020   Procedure: XI ROBOTIC ASSITED  NEPHRECTOMY;  Surgeon: Raynelle Bring, MD;  Location: [facility];  Service: Urology;  Laterality: Right;    ROS: Review of Systems Negative except as stated above  PHYSICAL EXAM: BP (!) 146/89 (BP Location: Left Arm, Patient Position: Sitting, Cuff Size: Large)   Pulse 97   Temp 97.7 F (36.5 C) (Oral)   Ht '6\' 2"'$  (1.88 m)   Wt 237 lb (107.5 kg)   SpO2 95%   BMI 30.43 kg/m   Physical Exam Repeat 146/90  General appearance - alert, well appearing, and in no distress Mental status - normal mood, behavior, speech, dress, motor activity, and thought processes Neck - supple, no significant adenopathy Chest - clear to auscultation, no wheezes, rales or rhonchi, symmetric air entry Heart - normal rate, regular rhythm, normal S1, S2, no murmurs, rubs, clicks or gallops Musculoskeletal - He has Heberden node noted on the right fourth and middle finger DIP joints.  Extremities - peripheral pulses normal, no pedal edema, no clubbing or cyanosis     Latest Ref Rng & Units 07/27/2021    2:35 PM 12/24/2020    4:05 AM 12/23/2020    1:46 PM  CMP  Glucose 70 - 99 mg/dL 88  105  158   BUN 8 - 27  mg/dL '21  21  18   '$ Creatinine 0.76 - 1.27 mg/dL 1.66  2.25  2.37   Sodium 134 - 144 mmol/L 140  139  134   Potassium 3.5 - 5.2 mmol/L 4.6  4.4  3.7   Chloride 96 - 106 mmol/L 106  104  99   CO2 20 - 29 mmol/L '22  27  26   '$ Calcium 8.6 - 10.2 mg/dL 9.4  8.4  8.1   Total Protein 6.0 - 8.5 g/dL 6.6     Total Bilirubin 0.0 - 1.2 mg/dL 0.3     Alkaline Phos 44 - 121 IU/L 82     AST 0 - 40 IU/L 23     ALT 0 - 44 IU/L 19      Lipid Panel     Component Value Date/Time   CHOL 133 07/27/2021 1435   TRIG 67 07/27/2021 1435   HDL 69 07/27/2021 1435   CHOLHDL 1.9 07/27/2021 1435   LDLCALC 50 07/27/2021 1435    CBC    Component Value Date/Time   WBC 6.1 07/27/2021 1435   WBC 5.8 11/27/2020 0805   RBC 4.82 07/27/2021 1435   RBC 5.21 11/27/2020 0805   HGB 13.6 07/27/2021 1435   HCT 41.4 07/27/2021 1435   PLT 203 07/27/2021 1435   MCV 86 07/27/2021 1435   MCH 28.2 07/27/2021 1435   MCH 27.6 11/27/2020 0805   MCHC 32.9 07/27/2021 1435   MCHC 31.6 11/27/2020 0805   RDW 14.4 07/27/2021 1435   LYMPHSABS 2.9 09/28/2018 1000   EOSABS 0.2 09/28/2018 1000   BASOSABS 0.1 09/28/2018 1000    ASSESSMENT AND PLAN: 1. Essential hypertension Not at goal but patient reports good readings at home.  Continue hydralazine, diltiazem, metoprolol.  We discussed increasing the hydralazine to 20 mg twice a day versus having him follow-up with the clinical pharmacist in a few weeks for recheck.  He prefers the latter.  He will bring his blood pressure  readings with him.  2. Paroxysmal atrial fibrillation ([facility]) Continue Eliquis.  He is on flecainide as prescribed by cardiology.  3. Pure hypercholesterolemia Continue atorvastatin.  4. OSA on CPAP Encouraged him to continue using his CPAP regularly.  5. Stage 3a chronic kidney disease ([place]) Recheck kidney function today. - Basic Metabolic Panel  6. Arthritis of finger of right hand Advised that these are changes consistent with arthritis or small  cyst.  We agreed to observe for now.  7. Screening for colon cancer Patient given information to call [facility] to schedule his colonoscopy.  They have been trying to reach him.  8. Need for influenza vaccination - Flu Vaccine QUAD High Dose(Fluad)  9. Need for shingles vaccine - Zoster Vaccine Adjuvanted [facility]) injection; Inject 0.5 mLs into the muscle once for 1 dose.  Dispense: 0.5 mL; Refill: 0     Patient was given the opportunity to ask questions.  Patient verbalized understanding of the plan and was able to repeat key elements of the plan.   This documentation was completed using Radio producer.  Any transcriptional errors are unintentional.  Orders Placed This Encounter  Procedures   Flu Vaccine QUAD High Dose(Fluad)   Basic Metabolic Panel     Requested Prescriptions   Signed Prescriptions Disp Refills   Zoster Vaccine Adjuvanted [facility]) injection 0.5 mL 0    Sig: Inject 0.5 mLs into the muscle once for 1 dose.    Return for Appt with Lurena Joiner in 2 wks for BP check and for Pneumonia vaccine.  Schedule Medicare visit with CMA.  Karle Plumber, MD, FACP

## 2021-11-30 NOTE — Patient Instructions (Addendum)
Please call [facility] to schedule your colonoscopy.  They have been trying to reach you over the past 2 months.  [address] [place], [place] [address] PH# [phone].  Check your blood pressure at least twice a week and record the readings.  Bring the readings with you when you come back in 2 weeks to see our clinical pharmacist.

## 2021-12-01 LAB — BASIC METABOLIC PANEL
BUN/Creatinine Ratio: 11 (ref 10–24)
BUN: 17 mg/dL (ref 8–27)
CO2: 23 mmol/L (ref 20–29)
Calcium: 9.3 mg/dL (ref 8.6–10.2)
Chloride: 105 mmol/L (ref 96–106)
Creatinine, Ser: 1.55 mg/dL — ABNORMAL HIGH (ref 0.76–1.27)
Glucose: 89 mg/dL (ref 70–99)
Potassium: 4.1 mmol/L (ref 3.5–5.2)
Sodium: 141 mmol/L (ref 134–144)
eGFR: 48 mL/min/{1.73_m2} — ABNORMAL LOW (ref >59)

## 2021-12-03 ENCOUNTER — Emergency Department (HOSPITAL_COMMUNITY): Payer: Medicare HMO

## 2021-12-03 ENCOUNTER — Other Ambulatory Visit: Payer: Self-pay

## 2021-12-03 ENCOUNTER — Emergency Department (HOSPITAL_COMMUNITY)
Admission: EM | Admit: 2021-12-03 | Discharge: 2021-12-03 | Disposition: A | Discharge status: Home / Self Care | Payer: Medicare HMO | Attending: Emergency Medicine | Admitting: Emergency Medicine

## 2021-12-03 ENCOUNTER — Encounter (HOSPITAL_COMMUNITY): Payer: Self-pay

## 2021-12-03 DIAGNOSIS — R7989 Other specified abnormal findings of blood chemistry: Secondary | ICD-10-CM | POA: Diagnosis not present

## 2021-12-03 DIAGNOSIS — Z79899 Other long term (current) drug therapy: Secondary | ICD-10-CM | POA: Diagnosis not present

## 2021-12-03 DIAGNOSIS — R0602 Shortness of breath: Secondary | ICD-10-CM | POA: Insufficient documentation

## 2021-12-03 DIAGNOSIS — Z85528 Personal history of other malignant neoplasm of kidney: Secondary | ICD-10-CM | POA: Insufficient documentation

## 2021-12-03 DIAGNOSIS — Z7901 Long term (current) use of anticoagulants: Secondary | ICD-10-CM | POA: Diagnosis not present

## 2021-12-03 DIAGNOSIS — I10 Essential (primary) hypertension: Secondary | ICD-10-CM | POA: Diagnosis not present

## 2021-12-03 DIAGNOSIS — R001 Bradycardia, unspecified: Secondary | ICD-10-CM | POA: Insufficient documentation

## 2021-12-03 DIAGNOSIS — G4733 Obstructive sleep apnea (adult) (pediatric): Secondary | ICD-10-CM | POA: Diagnosis not present

## 2021-12-03 DIAGNOSIS — J439 Emphysema, unspecified: Secondary | ICD-10-CM | POA: Diagnosis not present

## 2021-12-03 DIAGNOSIS — R42 Dizziness and giddiness: Secondary | ICD-10-CM | POA: Insufficient documentation

## 2021-12-03 LAB — URINALYSIS, ROUTINE W REFLEX MICROSCOPIC
Bilirubin Urine: NEGATIVE
Glucose, UA: NEGATIVE mg/dL
Hgb urine dipstick: NEGATIVE
Ketones, ur: NEGATIVE mg/dL
Leukocytes,Ua: NEGATIVE
Nitrite: NEGATIVE
Protein, ur: NEGATIVE mg/dL
Specific Gravity, Urine: 1.011 (ref 1.005–1.030)
pH: 5 (ref 5.0–8.0)

## 2021-12-03 LAB — BASIC METABOLIC PANEL
Anion gap: 8 (ref 5–15)
BUN: 18 mg/dL (ref 8–23)
CO2: 24 mmol/L (ref 22–32)
Calcium: 8.6 mg/dL — ABNORMAL LOW (ref 8.9–10.3)
Chloride: 107 mmol/L (ref 98–111)
Creatinine, Ser: 2.08 mg/dL — ABNORMAL HIGH (ref 0.61–1.24)
GFR, Estimated: 34 mL/min — ABNORMAL LOW (ref >60)
Glucose, Bld: 115 mg/dL — ABNORMAL HIGH (ref 70–99)
Potassium: 3.8 mmol/L (ref 3.5–5.1)
Sodium: 139 mmol/L (ref 135–145)

## 2021-12-03 LAB — CBC
HCT: 43.1 % (ref 39.0–52.0)
Hemoglobin: 14 g/dL (ref 13.0–17.0)
MCH: 28.5 pg (ref 26.0–34.0)
MCHC: 32.5 g/dL (ref 30.0–36.0)
MCV: 87.8 fL (ref 80.0–100.0)
Platelets: 208 10*3/uL (ref 150–400)
RBC: 4.91 MIL/uL (ref 4.22–5.81)
RDW: 14.9 % (ref 11.5–15.5)
WBC: 5.2 10*3/uL (ref 4.0–10.5)
nRBC: 0 % (ref 0.0–0.2)

## 2021-12-03 MED ORDER — SODIUM CHLORIDE 0.9 % IV BOLUS
1000.0000 mL | Freq: Once | INTRAVENOUS | Status: AC
  Administered 2021-12-03: 1000 mL via INTRAVENOUS

## 2021-12-03 NOTE — ED Notes (Signed)
HR while ambulating 48

## 2021-12-03 NOTE — ED Notes (Signed)
Pt d/c home per MD order. Discharge summary reviewed, pt verbalizes understanding. Ambulatory off unit. No s/s of acute distress noted at discharge.  °

## 2021-12-03 NOTE — ED Triage Notes (Signed)
Pt arrived POV from home c/o dizziness for a couple of hours when he tries to stand up. Pt also states he had one episode of dizziness while laying down. Pt also endorses some SHOB.

## 2021-12-03 NOTE — Discharge Instructions (Addendum)
Evaluation for your dizziness was overall reassuring.  It is likely that you were dehydrated.  Encouraged to know that your symptoms improved after you received intravascular fluids.  EKG did reveal new junctional bradycardia.  Cardiology advised follow-up outpatient with your cardiologist as soon as possible.  If you have new or worsening dizziness, chest pain or shortness of breath please return to the emergency department for further evaluation.

## 2021-12-03 NOTE — ED Provider Notes (Signed)
[facility] EMERGENCY DEPARTMENT Provider Note   CSN: [id number] Arrival date & time: 12/03/21  1349     History  Chief Complaint  Patient presents with   Dizziness   HPI Albert Good. is a 66 y.o. male with A-fib, hypertension and neoplasm of the right kidney presenting for dizziness.  Stated that this afternoon he took a nap and still and then started to feel lightheaded like he was going to pass  out and then dizzy.  Denies room spinning sensation.  Endorses associated shortness of breath.  Denies chest pain.  Dizziness worse with standing up but improved with sitting down.  States he does have a history of A-fib has been on metoprolol for some time now with the same dose and tolerated well.  Also on blood thinner.  Denies bloody stools.  Also states he believes he only had 1-2 bottles of water in the last 2 to 3 days.  Denies alcohol or marijuana consumption. Denies trauma.   Dizziness      Home Medications Prior to Admission medications   Medication Sig Start Date End Date Taking? Authorizing Provider  allopurinol (ZYLOPRIM) 300 MG tablet TAKE 1 TABLET (300 MG TOTAL) BY MOUTH DAILY. TO TREAT GOUT 07/27/21   Ladell Pier, MD  apixaban (ELIQUIS) 5 MG TABS tablet Take 1 tablet (5 mg total) by mouth 2 (two) times daily. 06/29/21   Elgie Collard, PA-C  atorvastatin (LIPITOR) 10 MG tablet Take 1 tablet (10 mg total) by mouth once daily. 06/29/21   Elgie Collard, PA-C  atorvastatin (LIPITOR) 10 MG tablet Take 1 tablet (10 mg total) by mouth once daily. 07/28/21   Ladell Pier, MD  colchicine 0.6 MG tablet Take 1 tablet (0.6 mg total) by mouth daily. 07/27/21   Ladell Pier, MD  diltiazem (CARDIZEM CD) 300 MG 24 hr capsule TAKE 1 CAPSULE (300 MG TOTAL) BY MOUTH DAILY. 06/29/21   Elgie Collard, PA-C  flecainide (TAMBOCOR) 50 MG tablet Take 1 tablet (50 mg total) by mouth daily. 06/29/21   Elgie Collard, PA-C  hydrALAZINE (APRESOLINE) 10 MG tablet  Take 1 tablet (10 mg total) by mouth 2 (two) times daily. 06/29/21   Elgie Collard, PA-C  hydrALAZINE (APRESOLINE) 10 MG tablet Take 1 tablet (10 mg total) by mouth 2 (two) times daily. 07/28/21   Ladell Pier, MD  metoprolol succinate (TOPROL-XL) 100 MG 24 hr tablet Take 1 tablet (100 mg total) by mouth daily. Take with or immediately following a meal. 06/29/21   Elgie Collard, PA-C  tamsulosin (FLOMAX) 0.4 MG CAPS capsule TAKE 2 CAPSULES (0.8 MG TOTAL) BY MOUTH AT BEDTIME. 11/06/21   Ladell Pier, MD  triamcinolone cream (KENALOG) 0.1 % Apply 1 Application topically 2 (two) times daily. Patient not taking: Reported on 11/30/2021 07/27/21   Ladell Pier, MD      Allergies    Other    Review of Systems   Review of Systems  Neurological:  Positive for dizziness.    Physical Exam Updated Vital Signs BP 120/71   Pulse (!) 42   Temp 98.5 F (36.9 C) (Oral)   Resp 18   Ht '6\' 3"'$  (1.905 m)   Wt 105.2 kg   SpO2 99%   BMI 29.00 kg/m  Physical Exam Vitals and nursing note reviewed.  HENT:     Head: Normocephalic and atraumatic.     Mouth/Throat:     Mouth: Mucous membranes  are moist.  Eyes:     General:        Right eye: No discharge.        Left eye: No discharge.     Conjunctiva/sclera: Conjunctivae normal.  Cardiovascular:     Rate and Rhythm: Normal rate and regular rhythm.     Pulses: Normal pulses.     Heart sounds: Normal heart sounds.  Pulmonary:     Effort: Pulmonary effort is normal.     Breath sounds: Normal breath sounds.  Abdominal:     General: Abdomen is flat.     Palpations: Abdomen is soft.  Skin:    General: Skin is warm and dry.  Neurological:     General: No focal deficit present.     Comments: GCS 15. Speech is goal oriented. No deficits appreciated to CN III-XII; symmetric eyebrow raise, no facial drooping, tongue midline. Patient has equal grip strength bilaterally with 5/5 strength against resistance in all major muscle groups  bilaterally. Sensation to light touch intact. Patient moves extremities without ataxia. Normal finger-nose-finger. Patient ambulatory with steady gait.   Psychiatric:        Mood and Affect: Mood normal.     ED Results / Procedures / Treatments   Labs (all labs ordered are listed, but only abnormal results are displayed) Labs Reviewed  BASIC METABOLIC PANEL - Abnormal; Notable for the following components:      Result Value   Glucose, Bld 115 (*)    Creatinine, Ser 2.08 (*)    Calcium 8.6 (*)    GFR, Estimated 34 (*)    All other components within normal limits  CBC  URINALYSIS, ROUTINE W REFLEX MICROSCOPIC  CBG MONITORING, ED    EKG EKG Interpretation  Date/Time:  Thursday December 03 2021 13:45:18 EST Ventricular Rate:  53 PR Interval:    QRS Duration: 112 QT Interval:  438 QTC Calculation: 410 R Axis:   24 Text Interpretation: Junctional rhythm Nonspecific ST and T wave abnormality Abnormal ECG When compared with ECG of 13-Jul-2018 09:46, PREVIOUS ECG IS PRESENT Confirmed by Garnette Gunner [phone]) on 12/03/2021 3:51:27 PM  Radiology DG Chest 2 View  Result Date: 12/03/2021 CLINICAL DATA:  Shortness of breath. EXAM: CHEST - 2 VIEW COMPARISON:  06/15/2021 FINDINGS: The cardiac silhouette, mediastinal and hilar contours are within normal limits and stable. The lungs are clear of an acute process. Stable underlying emphysematous changes and hyperinflation. No pulmonary lesions or pleural effusions. The bony thorax is intact. IMPRESSION: Emphysematous changes but no acute pulmonary findings. Electronically Signed   By: Marijo Sanes M.D.   On: 12/03/2021 15:40    Procedures Procedures    Medications Ordered in ED Medications  sodium chloride 0.9 % bolus 1,000 mL (1,000 mLs Intravenous New Bag/Given 12/03/21 1511)    ED Course/ Medical Decision Making/ A&P Clinical Course as of 12/03/21 1724  Thu Dec 03, 2021  1709 Ambulated patient around the unit.  Patient was  asymptomatic without dizziness.  Heart rate between 48-52. [JR]    Clinical Course User Index [JR] Harriet Pho, PA-C                           Medical Decision Making Amount and/or Complexity of Data Reviewed Labs: ordered. Radiology: ordered.   This patient presents to the ED for concern of dizziness, this involves a number of treatment options, and is a complaint that carries with it a high risk of  complications and morbidity.  The differential diagnosis includes arrhythmia, ACS, intracranial pathology, dehydration, and electrolyte derangement.   Co morbidities: Discussed in HPI   EMR reviewed including pt PMHx, past surgical history and past visits to ER.   See HPI for more details   Lab Tests:   I ordered and independently interpreted labs. Labs notable for elevated creatinine    Imaging Studies:  NAD. I personally reviewed all imaging studies and no acute abnormality found. I agree with radiology interpretation.    Cardiac Monitoring:  The patient was maintained on a cardiac monitor.  I personally viewed and interpreted the cardiac monitored which showed an underlying rhythm of: junctional bradycardia EKG non-ischemic   Medicines ordered:  I ordered medication including NS bolus for volume resuscitation Reevaluation of the patient after these medicines showed that the patient improved I have reviewed the patients home medicines and have made adjustments as needed   Critical Interventions:  none   Consults/Attending Physician   I requested consultation with Dr. Gasper Sells of cardiology,  and discussed lab and imaging findings as well as pertinent plan - they recommend: if patient asymptomatic with ambulation patient appropriate to discharge and follow up outpatient.   Reevaluation:  After the interventions noted above I re-evaluated patient and found that they have :improved    Problem List / ED Course:  Patient presented for dizziness.   Exam was overall reassuring with no FND.  Considered intracranial pathology but unlikely given reassuring neuroexam.  Also consider ACS but unlikely given nonischemic EKG along with no chest pain.  Considered arrhythmia which could be contributing somewhat to his dizziness given that new junctional bradycardia discovered on exam along with dehydration given patient has had minimal p.o. intake in the last couple days.  Symptoms improved with normal saline bolus.  Consulted cardiology for junctional bradycardia who advised if patient is able to ambulate without symptoms with improvement of his heart rate, patient is appropriate to follow-up outpatient.  Ambulated patient and he was in fact asymptomatic and heart rate improved from low 40s to high 40s and low 50s which is near his baseline per review of his last cardiology visit on June 19 of this year. Discussed return precautions.   Dispostion:  After consideration of the diagnostic results and the patients response to treatment, I feel that the patient would benefit from discharge and follow-up outpatient with his cardiologist.          Final Clinical Impression(s) / ED Diagnoses Final diagnoses:  Dizziness    Rx / DC Orders ED Discharge Orders     None         Harriet Pho, PA-C 12/03/21 1724    Cristie Hem, MD 12/04/21 0710

## 2021-12-07 ENCOUNTER — Other Ambulatory Visit: Payer: Self-pay

## 2021-12-14 ENCOUNTER — Telehealth: Payer: Self-pay | Admitting: Gastroenterology

## 2021-12-14 NOTE — Telephone Encounter (Signed)
Good Morning Dr Loletha Carrow  Supervising MD 12/14/21  We have received a referral for patient to have a coon cancer screening. Patient was due in August of 2023. Patient last procedure was 2018.  Records are available for review in epics. Please review and advise on scheduling.  Thank you

## 2021-12-15 NOTE — Telephone Encounter (Signed)
(  For documentation only: Colonoscopies in [place], procedure and pathology results in epic chart  August 2018 -no polyps, complete exam with good prep December 2015 -3 tubular adenomas 5 to 9 mm in size (removed with biopsy and "cauterized") February 2013 -2 diminutive tubular adenomas removed with biopsy)  ____________________________  Based on previous reports, this patient is due for surveillance colonoscopy.  He has atrial fibrillation and is on Eliquis, so he needs an appointment with an APP to assess cardiac condition and then communicate with his cardiologist regarding Eliquis hold for procedure.  - HD

## 2021-12-16 ENCOUNTER — Other Ambulatory Visit: Payer: Self-pay

## 2021-12-31 ENCOUNTER — Other Ambulatory Visit (HOSPITAL_COMMUNITY): Payer: Self-pay

## 2021-12-31 ENCOUNTER — Other Ambulatory Visit: Payer: Self-pay

## 2022-01-01 ENCOUNTER — Ambulatory Visit: Payer: Medicare HMO | Attending: Internal Medicine

## 2022-01-01 ENCOUNTER — Other Ambulatory Visit: Payer: Self-pay

## 2022-01-01 DIAGNOSIS — Z0000 Encounter for general adult medical examination without abnormal findings: Secondary | ICD-10-CM

## 2022-01-01 NOTE — Progress Notes (Signed)
Subjective:   Albert Good. is a 66 y.o. male who presents for Medicare Annual/Subsequent preventive examination.  Review of Systems    connected with Mr.Peavy on  01/01/22 at  1054 am by telephone and verified that I am speaking with the correct person using two identifiers. I discussed the limitations, risks, security and privacy concerns of performing an evaluation and management service by telephone and the availability of in person appointments. I also discussed with the patient that there may be a patient responsible charge related to this service. The patient expressed understanding and agreed to proceed.  Patient location:  Work  My Location: Scientist, research (physical sciences) and wellness  Persons on the telephone call:   Myself(Jenelle Drennon Woodman ) and Mr. Lack        Objective:    There were no vitals filed for this visit. There is no height or weight on file to calculate BMI.     01/01/2022   11:06 AM 12/22/2020    5:46 AM 11/27/2020    8:16 AM 08/04/2020    8:51 AM 01/19/2020    8:18 PM  Advanced Directives  Does Patient Have a Medical Advance Directive? No No No No No  Would patient like information on creating a medical advance directive? Yes (ED - Information included in AVS) No - Patient declined No - Patient declined No - Patient declined No - Patient declined    Current Medications (verified) Outpatient Encounter Medications as of 01/01/2022  Medication Sig   allopurinol (ZYLOPRIM) 300 MG tablet TAKE 1 TABLET (300 MG TOTAL) BY MOUTH DAILY. TO TREAT GOUT   apixaban (ELIQUIS) 5 MG TABS tablet Take 1 tablet (5 mg total) by mouth 2 (two) times daily.   atorvastatin (LIPITOR) 10 MG tablet Take 1 tablet (10 mg total) by mouth once daily.   atorvastatin (LIPITOR) 10 MG tablet Take 1 tablet (10 mg total) by mouth once daily.   colchicine 0.6 MG tablet Take 1 tablet (0.6 mg total) by mouth daily.   diltiazem (CARDIZEM CD) 300 MG 24 hr capsule TAKE 1 CAPSULE (300 MG TOTAL) BY MOUTH  DAILY.   flecainide (TAMBOCOR) 50 MG tablet Take 1 tablet (50 mg total) by mouth daily.   hydrALAZINE (APRESOLINE) 10 MG tablet Take 1 tablet (10 mg total) by mouth 2 (two) times daily.   hydrALAZINE (APRESOLINE) 10 MG tablet Take 1 tablet (10 mg total) by mouth 2 (two) times daily.   metoprolol succinate (TOPROL-XL) 100 MG 24 hr tablet Take 1 tablet (100 mg total) by mouth daily. Take with or immediately following a meal.   tamsulosin (FLOMAX) 0.4 MG CAPS capsule TAKE 2 CAPSULES (0.8 MG TOTAL) BY MOUTH AT BEDTIME.   triamcinolone cream (KENALOG) 0.1 % Apply 1 Application topically 2 (two) times daily.   No facility-administered encounter medications on file as of 01/01/2022.    Allergies (verified) Other   History: Past Medical History:  Diagnosis Date   BPH associated with nocturia 03/12/2018   BPH with obstruction/lower urinary tract symptoms    Chronic gout of multiple sites 03/12/2018   Dysrhythmia    Afib   Essential hypertension    Gout    Long-term use of aspirin therapy 03/12/2018   OSA (obstructive sleep apnea)    uses Cpap   Paroxysmal atrial fibrillation [facility])    Past Surgical History:  Procedure Laterality Date   LIPOSUCTION     ROBOTIC ASSITED PARTIAL NEPHRECTOMY Right 12/22/2020   Procedure: XI ROBOTIC ASSITED  NEPHRECTOMY;  Surgeon: Raynelle Bring, MD;  Location: [facility];  Service: Urology;  Laterality: Right;   Family History  Problem Relation Age of Onset   Diabetes Mother    Hypertension Father    Diabetes Sister    Diabetes Brother    Heart failure Brother    Kidney failure Brother    Social History   Socioeconomic History   Marital status: Single    Spouse name: Not on file   Number of children: Not on file   Years of education: Not on file   Highest education level: Not on file  Occupational History   Not on file  Tobacco Use   Smoking status: Former    Years: 30.00    Types: Cigarettes   Smokeless tobacco: Former    Types: Snuff     Quit date: 11/13/2020  Vaping Use   Vaping Use: Never used  Substance and Sexual Activity   Alcohol use: Yes    Comment: 2 every other day   Drug use: Not Currently   Sexual activity: Yes    Birth control/protection: None  Other Topics Concern   Not on file  Social History Narrative   Not on file   Social Determinants of Health   Financial Resource Strain: Low Risk  (01/01/2022)   Overall Financial Resource Strain (CARDIA)    Difficulty of Paying Living Expenses: Not hard at all  Food Insecurity: No Food Insecurity (01/01/2022)   Hunger Vital Sign    Worried About Running Out of Food in the Last Year: Never true    [place] in the Last Year: Never true  Transportation Needs: No Transportation Needs (01/01/2022)   PRAPARE - Hydrologist (Medical): No    Lack of Transportation (Non-Medical): No  Physical Activity: Unknown (01/01/2022)   Exercise Vital Sign    Days of Exercise per Week: 0 days    Minutes of Exercise per Session: Not on file  Stress: No Stress Concern Present (01/01/2022)   [place]    Feeling of Stress : Not at all  Social Connections: Socially Isolated (01/01/2022)   Social Connection and Isolation Panel [NHANES]    Frequency of Communication with Friends and Family: More than three times a week    Frequency of Social Gatherings with Friends and Family: Once a week    Attends Religious Services: Never    Marine scientist or Organizations: No    Attends Music therapist: Never    Marital Status: Never married    Tobacco Counseling Counseling given: Not Answered   Clinical Intake:     Pain : No/denies pain     Diabetes: No     Diabetic?no         Activities of Daily Living    01/01/2022   11:07 AM  In your present state of health, do you have any difficulty performing the following activities:  Hearing? 0   Vision? 0  Difficulty concentrating or making decisions? 0  Walking or climbing stairs? 0  Dressing or bathing? 0  Doing errands, shopping? 0  Preparing Food and eating ? N  Using the Toilet? N  In the past six months, have you accidently leaked urine? N  Do you have problems with loss of bowel control? N  Managing your Medications? N  Managing your Finances? N  Housekeeping or managing your Housekeeping? N    Patient Care Team:  Ladell Pier, MD as PCP - General (Internal Medicine) Fay Records, MD as PCP - Cardiology (Cardiology) Sueanne Margarita, MD as PCP - Sleep Medicine (Cardiology)  Indicate any recent Medical Services you may have received from other than [facility] providers in the past year (date may be approximate).     Assessment:   This is a routine wellness examination for Stuart.  Hearing/Vision screen No results found.  Dietary issues and exercise activities discussed:     Goals Addressed   None   Depression Screen    11/30/2021   10:22 AM 07/27/2021    1:41 PM 11/27/2020   10:08 AM 08/04/2020    8:44 AM 06/18/2020    2:48 PM 05/23/2020    8:42 AM 01/24/2020    1:47 PM  PHQ 2/9 Scores  PHQ - 2 Score 0 0 0 0 0 0 1  PHQ- 9 Score 3      3    Fall Risk    01/01/2022   11:06 AM 11/30/2021   10:17 AM 07/27/2021    1:41 PM 11/27/2020   10:08 AM 08/04/2020    8:52 AM  Fall Risk   Falls in the past year? 0 0 0 0 0  Number falls in past yr: 0 0 0 0 0  Injury with Fall? 0 0 0 0 0  Risk for fall due to : No Fall Risks No Fall Risks No Fall Risks No Fall Risks No Fall Risks  Follow up     Education provided    FALL RISK PREVENTION PERTAINING TO THE HOME:  Any stairs in or around the home? Yes  If so, are there any without handrails? Yes  Home free of loose throw rugs in walkways, pet beds, electrical cords, etc? No  Adequate lighting in your home to reduce risk of falls? Yes   ASSISTIVE DEVICES UTILIZED TO PREVENT FALLS:  Life alert? No  Use of a  cane, walker or w/c? No  Grab bars in the bathroom? No  Shower chair or bench in shower? No  Elevated toilet seat or a handicapped toilet? Yes   TIMED UP AND GO:  Was the test performed? No .  Length of time to ambulate 10 feet:  sec.   Gait steady and fast without use of assistive device  Cognitive Function:    01/01/2022   11:08 AM  MMSE - Mini Mental State Exam  Orientation to time 5  Orientation to Place 5  Registration 3  Attention/ Calculation 5  Recall 3  Language- name 2 objects 2  Language- repeat 1  Language- follow 3 step command 3  Language- read & follow direction 1  Write a sentence 1  Copy design 1  Total score 30        01/01/2022   11:10 AM  6CIT Screen  What Year? 0 points  What month? 0 points  What time? 0 points  Count back from 20 0 points  Months in reverse 0 points  Repeat phrase 0 points  Total Score 0 points    Immunizations Immunization History  Administered Date(s) Administered   Fluad Quad(high Dose 65+) 11/30/2021   Influenza,inj,Quad PF,6+ Mos 09/28/2018, 01/24/2020   PFIZER(Purple Top)SARS-COV-2 Vaccination 03/23/2019, 04/23/2019   Pneumococcal Conjugate-13 01/24/2020   Tdap 05/23/2020    TDAP status: Up to date  Flu Vaccine status: Up to date  Pneumococcal vaccine status: Due, Education has been provided regarding the importance of this vaccine. Advised may receive  this vaccine at local pharmacy or Health Dept. Aware to provide a copy of the vaccination record if obtained from local pharmacy or Health Dept. Verbalized acceptance and understanding.  Covid-19 vaccine status: Information provided on how to obtain vaccines.   Qualifies for Shingles Vaccine? Yes   Zostavax completed No   Shingrix Completed?: No.    Education has been provided regarding the importance of this vaccine. Patient has been advised to call insurance company to determine out of pocket expense if they have not yet received this vaccine. Advised may  also receive vaccine at local pharmacy or Health Dept. Verbalized acceptance and understanding.  Screening Tests Health Maintenance  Topic Date Due   Hepatitis C Screening  Never done   Zoster Vaccines- Shingrix (1 of 2) Never done   COVID-19 Vaccine (3 - Pfizer risk series) 05/21/2019   Pneumonia Vaccine 72+ Years old (2 - PPSV23 or PCV20) 01/23/2021   COLONOSCOPY (Pts 45-66yr Insurance coverage will need to be confirmed)  09/02/2021   Medicare Annual Wellness (AWV)  01/02/2023   DTaP/Tdap/Td (2 - Td or Tdap) 05/24/2030   INFLUENZA VACCINE  Completed   HPV VACCINES  Aged Out    Health Maintenance  Health Maintenance Due  Topic Date Due   Hepatitis C Screening  Never done   Zoster Vaccines- Shingrix (1 of 2) Never done   COVID-19 Vaccine (3 - Pfizer risk series) 05/21/2019   Pneumonia Vaccine 68 Years old (2 - PPSV23 or PCV20) 01/23/2021   COLONOSCOPY (Pts 45-435yrInsurance coverage will need to be confirmed)  09/02/2021    Colorectal cancer screening: Referral to GI placed 01/01/22. Pt aware the office will call re: appt.  Lung Cancer Screening: (Low Dose CT Chest recommended if Age 68-80ears, 30 pack-year currently smoking OR have quit w/in 15years.) does not qualify.   Lung Cancer Screening Referral:   Additional Screening:  Hepatitis C Screening: does qualify; will  complete at next visit   Vision Screening: Recommended annual ophthalmology exams for early detection of glaucoma and other disorders of the eye. Is the patient up to date with their annual eye exam?  No  Who is the provider or what is the name of the office in which the patient attends annual eye exams?  Referral placed  If pt is not established with a provider, would they like to be referred to a provider to establish care?    .   Dental Screening: Recommended annual dental exams for proper oral hygiene  Community Resource Referral / Chronic Care Management: CRR required this visit?  No   CCM  required this visit?  No      Plan:     I have personally reviewed and noted the following in the patient's chart:   Medical and social history Use of alcohol, tobacco or illicit drugs  Current medications and supplements including opioid prescriptions. Patient is not currently taking opioid prescriptions. Functional ability and status Nutritional status Physical activity Advanced directives List of other physicians Hospitalizations, surgeries, and ER visits in previous 12 months Vitals Screenings to include cognitive, depression, and falls Referrals and appointments  In addition, I have reviewed and discussed with patient certain preventive protocols, quality metrics, and best practice recommendations. A written personalized care plan for preventive services as well as general preventive health recommendations were provided to patient.     CaLillie ColumbiaCMMoody 01/01/2022   Nurse Notes:

## 2022-01-02 DIAGNOSIS — G4733 Obstructive sleep apnea (adult) (pediatric): Secondary | ICD-10-CM | POA: Diagnosis not present

## 2022-01-05 ENCOUNTER — Ambulatory Visit: Payer: Medicare HMO | Attending: Family Medicine | Admitting: Pharmacist

## 2022-01-05 ENCOUNTER — Other Ambulatory Visit: Payer: Self-pay

## 2022-01-05 VITALS — BP 132/81 | HR 54

## 2022-01-05 DIAGNOSIS — Z23 Encounter for immunization: Secondary | ICD-10-CM

## 2022-01-05 DIAGNOSIS — I10 Essential (primary) hypertension: Secondary | ICD-10-CM | POA: Diagnosis not present

## 2022-01-05 NOTE — Progress Notes (Signed)
S:     No chief complaint on file.  66 y.o. male who presents for hypertension evaluation, education, and management. 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  Neg), aortic atherosclerosis on CT chest 09/2020.   Patient was referred and last seen by Primary Care Provider, Dr. Wynetta Emery, on 11/30/2021. BP at that visit was above goal. Pt endorsed good medication adherence and good home BP readings. He was instructed to return for BP check today.   Today, patient arrives in good spirits and presents without assistance. Denies dizziness, headache, blurred vision, swelling.   Patient reports hypertension is longstanding.   Family/Social history:  -Fhx: DM, HTN -Tobacco: former smoker (quit in 2022) -Alcohol: none reported   Medication adherence reported. Patient has taken BP medications today.   Current antihypertensives include: diltiazem CD 300 mg daily, hydralazine 10 mg BID, Toprol-XL 100 mg daily   Antihypertensives tried in the past include: irbesartan (AKI), valsartan (backorder at the time)  Reported home BP readings: no numbers recalled since last visit with Dr. Wynetta Emery  Patient reported dietary habits:  -Compliant with sodium restriction  -Denies caffeine intake   Patient-reported exercise habits: none reported  O:  Vitals:   01/05/22 1652  BP: 132/81  Pulse: (!) 54     Last 3 Office BP readings: BP Readings from Last 3 Encounters:  01/05/22 132/81  12/03/21 135/73  11/30/21 (!) 146/89    BMET    Component Value Date/Time   NA 139 12/03/2021 1520   NA 141 11/30/2021 1120   K 3.8 12/03/2021 1520   CL 107  12/03/2021 1520   CO2 24 12/03/2021 1520   GLUCOSE 115 (H) 12/03/2021 1520   BUN 18 12/03/2021 1520   BUN 17 11/30/2021 1120   CREATININE 2.08 (H) 12/03/2021 1520   CALCIUM 8.6 (L) 12/03/2021 1520   GFRNONAA 34 (L) 12/03/2021 1520   GFRAA 69 08/30/2019 0943    Renal function: CrCl cannot be calculated (Patient's most recent lab result is older than the maximum 21 days allowed.).  Clinical ASCVD: aortic atherosclerosis noted on CT scan 2022 The 10-year ASCVD risk score (Arnett DK, et al., 2019) is: 15.3%   Values used to calculate the score:     Age: 27 years     Sex: Male     Is Non-Hispanic African American: Yes     Diabetic: No     Tobacco smoker: No     Systolic Blood Pressure: 867 mmHg     Is BP treated: Yes     HDL Cholesterol: 69 mg/dL     Total Cholesterol: 133 mg/dL  A/P: Hypertension longstanding currently at goal on current medications. BP goal < 130/80 mmHg. Medication adherence appears appropriate.  -Continued current regimen. -Counseled on lifestyle modifications for blood pressure control including reduced dietary sodium, increased exercise, adequate sleep. -Encouraged patient to check BP at home and bring log of readings to next visit. Counseled on proper use of home BP cuff.  -Prevnar given.   Results reviewed and written information provided.    Written patient instructions provided. Patient verbalized understanding of treatment plan.  Total time in face to face counseling 20 minutes.  Follow-up:  Pharmacist prn. PCP clinic visit in 01/29/2021.  Benard Halsted, PharmD, [facility] [place], [place] [phone]

## 2022-01-13 ENCOUNTER — Encounter: Payer: Self-pay | Admitting: Physician Assistant

## 2022-01-15 ENCOUNTER — Other Ambulatory Visit (HOSPITAL_COMMUNITY): Payer: Self-pay

## 2022-01-20 ENCOUNTER — Other Ambulatory Visit: Payer: Self-pay

## 2022-01-27 ENCOUNTER — Encounter: Payer: Self-pay | Admitting: Internal Medicine

## 2022-01-27 DIAGNOSIS — R7989 Other specified abnormal findings of blood chemistry: Secondary | ICD-10-CM | POA: Insufficient documentation

## 2022-01-29 ENCOUNTER — Ambulatory Visit: Payer: Medicare HMO | Attending: Internal Medicine | Admitting: Internal Medicine

## 2022-01-29 ENCOUNTER — Other Ambulatory Visit: Payer: Self-pay

## 2022-01-29 VITALS — BP 123/70 | HR 55 | Ht 75.0 in | Wt 243.0 lb

## 2022-01-29 DIAGNOSIS — Z23 Encounter for immunization: Secondary | ICD-10-CM | POA: Diagnosis not present

## 2022-01-29 DIAGNOSIS — I10 Essential (primary) hypertension: Secondary | ICD-10-CM | POA: Diagnosis not present

## 2022-01-29 DIAGNOSIS — I480 Paroxysmal atrial fibrillation: Secondary | ICD-10-CM | POA: Diagnosis not present

## 2022-01-29 DIAGNOSIS — N1832 Chronic kidney disease, stage 3b: Secondary | ICD-10-CM

## 2022-01-29 MED ORDER — ATORVASTATIN CALCIUM 10 MG PO TABS
10.0000 mg | ORAL_TABLET | Freq: Every day | ORAL | 3 refills | Status: DC
  Filled 2022-01-29 – 2022-04-15 (×2): qty 90, 90d supply, fill #0
  Filled 2022-07-12 – 2022-07-28 (×3): qty 90, 90d supply, fill #1
  Filled 2022-10-21 – 2022-10-22 (×2): qty 90, 90d supply, fill #2

## 2022-01-29 MED ORDER — HYDRALAZINE HCL 10 MG PO TABS
10.0000 mg | ORAL_TABLET | Freq: Two times a day (BID) | ORAL | 3 refills | Status: DC
  Filled 2022-01-29 – 2022-04-28 (×2): qty 180, 90d supply, fill #0
  Filled 2022-07-27: qty 180, 90d supply, fill #1
  Filled 2022-10-25: qty 180, 90d supply, fill #2

## 2022-01-29 MED ORDER — APIXABAN 5 MG PO TABS
5.0000 mg | ORAL_TABLET | Freq: Two times a day (BID) | ORAL | 3 refills | Status: DC
  Filled 2022-01-29 – 2022-03-31 (×2): qty 180, 90d supply, fill #0
  Filled 2022-06-29: qty 180, 90d supply, fill #1
  Filled 2022-09-27: qty 180, 90d supply, fill #2

## 2022-01-29 MED ORDER — HYDRALAZINE HCL 10 MG PO TABS
10.0000 mg | ORAL_TABLET | Freq: Two times a day (BID) | ORAL | 3 refills | Status: DC
  Filled 2022-01-29: qty 180, 90d supply, fill #0

## 2022-01-29 MED ORDER — DILTIAZEM HCL ER COATED BEADS 300 MG PO CP24
300.0000 mg | ORAL_CAPSULE | Freq: Every day | ORAL | 3 refills | Status: DC
  Filled 2022-01-29: qty 90, fill #0
  Filled 2022-03-30: qty 90, 90d supply, fill #0
  Filled 2022-06-24: qty 90, 90d supply, fill #1
  Filled 2022-09-22: qty 90, 90d supply, fill #2

## 2022-01-29 MED ORDER — METOPROLOL SUCCINATE ER 100 MG PO TB24
100.0000 mg | ORAL_TABLET | Freq: Every day | ORAL | 3 refills | Status: DC
  Filled 2022-01-29: qty 88, 88d supply, fill #0
  Filled 2022-01-29: qty 2, 2d supply, fill #0
  Filled 2022-05-10: qty 90, 90d supply, fill #1
  Filled 2022-08-04: qty 90, 90d supply, fill #2
  Filled 2022-11-02: qty 90, 90d supply, fill #3

## 2022-01-29 NOTE — Progress Notes (Signed)
Patient ID: Albert Good., male    DOB: Nov 18, 1955  MRN: [id number]  CC: Hypertension (HTN f/u. Med refills. )   Subjective: Albert Good is a 67 y.o. 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  Neg), aortic atherosclerosis on CT chest 09/2020   HTN/PAF: Needing refills on his blood pressure medications. No palpitations, bruising or bleeding on Eliquis No CP/SOB/LE edema Seen in ER 11/2021 with dizziness.  Thought to be a bit dehydrated.  Given IV fluids.  Patient has known CKD stage III.  However creatinine on ER visit was 2.08 with previous reading being 1.55.  GFR had been in the 40s.  On that ER visit it was 80.  HM:  Has appt with GI on 02/05/2022 to discuss colonoscopy..  Due for shingles vaccine and RSV.  Given rxn for shingles vaccine on last visit.  He will get the shingles first at any outside pharmacy.    Patient Active Problem List   Diagnosis Date Noted   Low testosterone in male 01/27/2022   Neoplasm of right kidney 12/22/2020   Aortic atherosclerosis ([place]) 11/27/2020   Renal mass 11/27/2020   Need for tetanus, diphtheria, and acellular pertussis (Tdap) vaccine 05/23/2020   Overweight (BMI 25.0-29.9) 01/24/2020   OSA (obstructive sleep apnea) 01/24/2020   History of colon polyps 01/24/2020   Chronic anticoagulation 03/28/2019   Paroxysmal atrial fibrillation ([place]) 03/12/2018   Essential hypertension 03/12/2018   BPH associated with nocturia 03/12/2018   Chronic gout of multiple sites 03/12/2018   Long-term use of aspirin therapy 03/12/2018   Chest pain  03/27/2017     Current Outpatient Medications on File Prior to Visit  Medication Sig Dispense Refill   allopurinol (ZYLOPRIM) 300 MG tablet TAKE 1 TABLET (300 MG TOTAL) BY MOUTH DAILY. TO TREAT GOUT 30 tablet 6   apixaban (ELIQUIS) 5 MG TABS tablet Take 1 tablet (5 mg total) by mouth 2 (two) times daily. 180 tablet 3   atorvastatin (LIPITOR) 10 MG tablet Take 1 tablet (10 mg total) by mouth once daily. 90 tablet 3   atorvastatin (LIPITOR) 10 MG tablet Take 1 tablet (10 mg total) by mouth once daily. 90 tablet 3   colchicine 0.6 MG tablet Take 1 tablet (0.6 mg total) by mouth daily. 30 tablet 6   diltiazem (CARDIZEM CD) 300 MG 24 hr capsule TAKE 1 CAPSULE (300 MG TOTAL) BY MOUTH DAILY. 90 capsule 3   flecainide (TAMBOCOR) 50 MG tablet Take 1 tablet (50 mg total) by mouth daily. 90 tablet 3   hydrALAZINE (APRESOLINE) 10 MG tablet Take 1 tablet (10 mg total) by mouth 2 (two) times daily. 180 tablet 3   hydrALAZINE (APRESOLINE) 10 MG tablet Take 1 tablet (10 mg total) by mouth 2 (two) times daily. 180 tablet 3   metoprolol succinate (TOPROL-XL) 100 MG 24 hr tablet Take 1 tablet (100 mg total) by mouth daily. Take with or immediately following a meal. 90 tablet 3   tamsulosin (FLOMAX) 0.4 MG CAPS capsule TAKE 2 CAPSULES (0.8 MG TOTAL) BY MOUTH AT BEDTIME. 180 capsule 0   triamcinolone cream (KENALOG) 0.1 % Apply  1 Application topically 2 (two) times daily. 30 g 0   No current facility-administered medications on file prior to visit.    Allergies  Allergen Reactions   Other Hives    HAIR DYE    Social History   Socioeconomic History   Marital status: Single    Spouse name: Not on file   Number of children: Not on file   Years of education: Not on file   Highest education level: Not on file  Occupational History   Not on file  Tobacco Use   Smoking status: Former    Years: 30.00    Types: Cigarettes   Smokeless tobacco: Former    Types: Snuff    Quit date: 11/13/2020  Vaping Use    Vaping Use: Never used  Substance and Sexual Activity   Alcohol use: Yes    Comment: 2 every other day   Drug use: Not Currently   Sexual activity: Yes    Birth control/protection: None  Other Topics Concern   Not on file  Social History Narrative   Not on file   Social Determinants of Health   Financial Resource Strain: Low Risk  (01/01/2022)   Overall Financial Resource Strain (CARDIA)    Difficulty of Paying Living Expenses: Not hard at all  Food Insecurity: No Food Insecurity (01/01/2022)   Hunger Vital Sign    Worried About Running Out of Food in the Last Year: Never true    [place] in the Last Year: Never true  Transportation Needs: No Transportation Needs (01/01/2022)   PRAPARE - Hydrologist (Medical): No    Lack of Transportation (Non-Medical): No  Physical Activity: Unknown (01/01/2022)   Exercise Vital Sign    Days of Exercise per Week: 0 days    Minutes of Exercise per Session: Not on file  Stress: No Stress Concern Present (01/01/2022)   [place]    Feeling of Stress : Not at all  Social Connections: Socially Isolated (01/01/2022)   Social Connection and Isolation Panel [NHANES]    Frequency of Communication with Friends and Family: More than three times a week    Frequency of Social Gatherings with Friends and Family: Once a week    Attends Religious Services: Never    Marine scientist or Organizations: No    Attends Archivist Meetings: Never    Marital Status: Never married  Intimate Partner Violence: Not At Risk (08/04/2020)   Humiliation, Afraid, Rape, and Kick questionnaire    Fear of Current or Ex-Partner: No    Emotionally Abused: No    Physically Abused: No    Sexually Abused: No    Family History  Problem Relation Age of Onset   Diabetes Mother    Hypertension Father    Diabetes Sister    Diabetes Brother    Heart  failure Brother    Kidney failure Brother     Past Surgical History:  Procedure Laterality Date   LIPOSUCTION     ROBOTIC ASSITED PARTIAL NEPHRECTOMY Right 12/22/2020   Procedure: XI ROBOTIC ASSITED  NEPHRECTOMY;  Surgeon: Raynelle Bring, MD;  Location: [facility];  Service: Urology;  Laterality: Right;    ROS: Review of Systems Negative except as stated above  PHYSICAL EXAM: BP 123/70 (BP Location: Left Arm, Patient Position: Sitting, Cuff Size: Large)   Pulse (!) 55   Ht '6\' 3"'$  (1.905 m)  Wt 243 lb (110.2 kg)   SpO2 97%   BMI 30.37 kg/m   Physical Exam   General appearance - alert, well appearing, and in no distress Mental status - normal mood, behavior, speech, dress, motor activity, and thought processes Chest - clear to auscultation, no wheezes, rales or rhonchi, symmetric air entry Heart - normal rate, regular rhythm, normal S1, S2, no murmurs, rubs, clicks or gallops Extremities - peripheral pulses normal, no pedal edema, no clubbing or cyanosis     Latest Ref Rng & Units 12/03/2021    3:20 PM 11/30/2021   11:20 AM 07/27/2021    2:35 PM  CMP  Glucose 70 - 99 mg/dL 115  89  88   BUN 8 - 23 mg/dL '18  17  21   '$ Creatinine 0.61 - 1.24 mg/dL 2.08  1.55  1.66   Sodium 135 - 145 mmol/L 139  141  140   Potassium 3.5 - 5.1 mmol/L 3.8  4.1  4.6   Chloride 98 - 111 mmol/L 107  105  106   CO2 22 - 32 mmol/L '24  23  22   '$ Calcium 8.9 - 10.3 mg/dL 8.6  9.3  9.4   Total Protein 6.0 - 8.5 g/dL   6.6   Total Bilirubin 0.0 - 1.2 mg/dL   0.3   Alkaline Phos 44 - 121 IU/L   82   AST 0 - 40 IU/L   23   ALT 0 - 44 IU/L   19    Lipid Panel     Component Value Date/Time   CHOL 133 07/27/2021 1435   TRIG 67 07/27/2021 1435   HDL 69 07/27/2021 1435   CHOLHDL 1.9 07/27/2021 1435   LDLCALC 50 07/27/2021 1435    CBC    Component Value Date/Time   WBC 5.2 12/03/2021 1409   RBC 4.91 12/03/2021 1409   HGB 14.0 12/03/2021 1409   HGB 13.6 07/27/2021 1435   HCT 43.1 12/03/2021 1409    HCT 41.4 07/27/2021 1435   PLT 208 12/03/2021 1409   PLT 203 07/27/2021 1435   MCV 87.8 12/03/2021 1409   MCV 86 07/27/2021 1435   MCH 28.5 12/03/2021 1409   MCHC 32.5 12/03/2021 1409   RDW 14.9 12/03/2021 1409   RDW 14.4 07/27/2021 1435   LYMPHSABS 2.9 09/28/2018 1000   EOSABS 0.2 09/28/2018 1000   BASOSABS 0.1 09/28/2018 1000    ASSESSMENT AND PLAN: 1. Essential hypertension At goal.  Continue current medications - diltiazem (CARDIZEM CD) 300 MG 24 hr capsule; Take 1 capsule (300 mg total) by mouth daily.  Dispense: 90 capsule; Refill: 3 - hydrALAZINE (APRESOLINE) 10 MG tablet; Take 1 tablet (10 mg total) by mouth 2 (two) times daily.  Dispense: 180 tablet; Refill: 3 - hydrALAZINE (APRESOLINE) 10 MG tablet; Take 1 tablet (10 mg total) by mouth 2 (two) times daily.  Dispense: 180 tablet; Refill: 3 - metoprolol succinate (TOPROL-XL) 100 MG 24 hr tablet; Take 1 tablet (100 mg total) by mouth daily. Take with or immediately following a meal.  Dispense: 90 tablet; Refill: 3  2. Paroxysmal atrial fibrillation (HCC) Continue Eliquis. - diltiazem (CARDIZEM CD) 300 MG 24 hr capsule; Take 1 capsule (300 mg total) by mouth daily.  Dispense: 90 capsule; Refill: 3 - metoprolol succinate (TOPROL-XL) 100 MG 24 hr tablet; Take 1 tablet (100 mg total) by mouth daily. Take with or immediately following a meal.  Dispense: 90 tablet; Refill: 3  3. Stage 3b chronic  kidney disease ([place]) Recheck BMP today to see whether GFR has returned to baseline in the 28D - Basic Metabolic Panel  4. Need for shingles vaccine Patient plans to get the shingles vaccine.  He has the prescription that I gave him on last visit.  Also encouraged him to consider getting RSV vaccine as well.     Patient was given the opportunity to ask questions.  Patient verbalized understanding of the plan and was able to repeat key elements of the plan.   This documentation was completed using Radio producer.   Any transcriptional errors are unintentional.  No orders of the defined types were placed in this encounter.    Requested Prescriptions    No prescriptions requested or ordered in this encounter    No follow-ups on file.  Karle Plumber, MD, FACP

## 2022-01-30 LAB — BASIC METABOLIC PANEL
BUN/Creatinine Ratio: 11 (ref 10–24)
BUN: 17 mg/dL (ref 8–27)
CO2: 22 mmol/L (ref 20–29)
Calcium: 9.2 mg/dL (ref 8.6–10.2)
Chloride: 102 mmol/L (ref 96–106)
Creatinine, Ser: 1.57 mg/dL — ABNORMAL HIGH (ref 0.76–1.27)
Glucose: 75 mg/dL (ref 70–99)
Potassium: 4.6 mmol/L (ref 3.5–5.2)
Sodium: 138 mmol/L (ref 134–144)
eGFR: 48 mL/min/{1.73_m2} — ABNORMAL LOW (ref >59)

## 2022-02-01 DIAGNOSIS — G4733 Obstructive sleep apnea (adult) (pediatric): Secondary | ICD-10-CM | POA: Diagnosis not present

## 2022-02-04 ENCOUNTER — Other Ambulatory Visit: Payer: Self-pay

## 2022-02-05 ENCOUNTER — Ambulatory Visit: Payer: Medicare HMO | Admitting: Physician Assistant

## 2022-02-05 ENCOUNTER — Telehealth: Payer: Self-pay

## 2022-02-05 ENCOUNTER — Other Ambulatory Visit: Payer: Self-pay

## 2022-02-05 ENCOUNTER — Encounter: Payer: Self-pay | Admitting: Physician Assistant

## 2022-02-05 VITALS — BP 150/97 | HR 68 | Ht 75.0 in | Wt 242.0 lb

## 2022-02-05 DIAGNOSIS — I480 Paroxysmal atrial fibrillation: Secondary | ICD-10-CM | POA: Diagnosis not present

## 2022-02-05 DIAGNOSIS — Z7901 Long term (current) use of anticoagulants: Secondary | ICD-10-CM

## 2022-02-05 DIAGNOSIS — Z86010 Personal history of colonic polyps: Secondary | ICD-10-CM

## 2022-02-05 MED ORDER — NA SULFATE-K SULFATE-MG SULF 17.5-3.13-1.6 GM/177ML PO SOLN
ORAL | 0 refills | Status: DC
  Filled 2022-02-05: qty 354, 2d supply, fill #0

## 2022-02-05 NOTE — Patient Instructions (Signed)
You have been scheduled for a colonoscopy. Please follow written instructions given to you at your visit today.  Please pick up your prep supplies at the pharmacy within the next 1-3 days. If you use inhalers (even only as needed), please bring them with you on the day of your procedure.   You will be contacted by our office prior to your procedure for directions on holding your Eliquis.  If you do not hear from our office 1 week prior to your scheduled procedure, please call [phone] to discuss.   If your blood pressure at your visit was 140/90 or greater, please contact your primary care physician to follow up on this.  _______________________________________________________  If you are age 70 or older, your body mass index should be between 23-30. Your Body mass index is 30.25 kg/m. If this is out of the aforementioned range listed, please consider follow up with your Primary Care Provider.  If you are age 39 or younger, your body mass index should be between 19-25. Your Body mass index is 30.25 kg/m. If this is out of the aformentioned range listed, please consider follow up with your Primary Care Provider.   ________________________________________________________  The [facility] providers would like to encourage you to use [facility] to communicate with providers for non-urgent requests or questions.  Due to long hold times on the telephone, sending your provider a message by [facility] may be a faster and more efficient way to get a response.  Please allow 48 business hours for a response.  Please remember that this is for non-urgent requests.   Due to recent changes in healthcare laws, you may see the results of your imaging and laboratory studies on MyChart before your provider has had a chance to review them.  We understand that in some cases there may be results that are confusing or concerning to you. Not all laboratory results come back in the same time frame and the provider may be  waiting for multiple results in order to interpret others.  Please give [place] 48 hours in order for your provider to thoroughly review all the results before contacting the office for clarification of your results.  Thank you for entrusting me with your care and choosing [facility].  Ellouise Newer PA-C

## 2022-02-05 NOTE — Progress Notes (Signed)
Chief Complaint: Discuss colonoscopy in a patient on chronic anticoagulation  HPI:    [name] is a 67 year old African-American male with a past medical history as listed below, including A-fib on Eliquis (08/08/2018 EF 53%), records previously reviewed by Dr. Loletha Carrow, who was referred to me by Ladell Pier, MD for consideration of a surveillance colonoscopy on chronic anticoagulation.    02/2011 colonoscopy in [place] with 2 diminutive adenomas.    12/2013 colonoscopy with 3 tubular adenomas 5-9 mm in size in [place].    08/2016 colonoscopy in [place] with no polyps, complete exam with good prep.  Repeat recommended in 5 years.    06/29/2021 follow-up with cardiology and at that time doing well, continued on Eliquis.    Today, the patient tells me that he moved from [place] down here after retiring.  He is not near any of his family and is afraid that he may not have a ride to his colonoscopy.  Denies any GI issues, complaints or concerns.  He is on Eliquis daily for A-fib and has had to hold this before for previous procedures.  No complications.    Denies fever, chills, weight loss, change in bowel habits or abdominal pain.  Past Medical History:  Diagnosis Date   BPH associated with nocturia 03/12/2018   BPH with obstruction/lower urinary tract symptoms    Chronic gout of multiple sites 03/12/2018   Dysrhythmia    Afib   Essential hypertension    Gout    Long-term use of aspirin therapy 03/12/2018   OSA (obstructive sleep apnea)    uses Cpap   Paroxysmal atrial fibrillation [facility])     Past Surgical History:  Procedure Laterality Date   LIPOSUCTION     ROBOTIC ASSITED PARTIAL NEPHRECTOMY Right 12/22/2020   Procedure: XI ROBOTIC ASSITED  NEPHRECTOMY;  Surgeon: Raynelle Bring, MD;  [facility];  Service: Urology;  Laterality: Right;    Current Outpatient Medications  Medication Sig Dispense Refill   allopurinol (ZYLOPRIM) 300 MG tablet TAKE 1  TABLET (300 MG TOTAL) BY MOUTH DAILY. TO TREAT GOUT 30 tablet 6   apixaban (ELIQUIS) 5 MG TABS tablet Take 1 tablet (5 mg total) by mouth 2 (two) times daily. 180 tablet 3   atorvastatin (LIPITOR) 10 MG tablet Take 1 tablet (10 mg total) by mouth once daily. 90 tablet 3   colchicine 0.6 MG tablet Take 1 tablet (0.6 mg total) by mouth daily. 30 tablet 6   diltiazem (CARDIZEM CD) 300 MG 24 hr capsule Take 1 capsule (300 mg total) by mouth daily. 90 capsule 3   flecainide (TAMBOCOR) 50 MG tablet Take 1 tablet (50 mg total) by mouth daily. 90 tablet 3   hydrALAZINE (APRESOLINE) 10 MG tablet Take 1 tablet (10 mg total) by mouth 2 (two) times daily. 180 tablet 3   hydrALAZINE (APRESOLINE) 10 MG tablet Take 1 tablet (10 mg total) by mouth 2 (two) times daily. 180 tablet 3   metoprolol succinate (TOPROL-XL) 100 MG 24 hr tablet Take 1 tablet (100 mg total) by mouth daily. Take with or immediately following a meal. 90 tablet 3   tamsulosin (FLOMAX) 0.4 MG CAPS capsule TAKE 2 CAPSULES (0.8 MG TOTAL) BY MOUTH AT BEDTIME. 180 capsule 0   triamcinolone cream (KENALOG) 0.1 % Apply 1 Application topically 2 (two) times daily. 30 g 0   No current facility-administered medications for this visit.    Allergies as of 02/05/2022 - Review Complete 02/05/2022  Allergen Reaction  Noted   Other Hives 03/09/2018    Family History  Problem Relation Age of Onset   Diabetes Mother    Hypertension Father    Diabetes Sister    Diabetes Brother    Heart failure Brother    Kidney failure Brother    Colon cancer Neg Hx    Stomach cancer Neg Hx    Esophageal cancer Neg Hx     Social History   Socioeconomic History   Marital status: Single    Spouse name: Not on file   Number of children: 7   Years of education: Not on file   Highest education level: Not on file  Occupational History   Occupation: flagger  Tobacco Use   Smoking status: Former    Years: 30.00    Types: Cigarettes   Smokeless tobacco:  Former    Types: Snuff    Quit date: 11/13/2020  Vaping Use   Vaping Use: Never used  Substance and Sexual Activity   Alcohol use: Yes    Comment: 2 every other day   Drug use: Not Currently   Sexual activity: Yes    Birth control/protection: None  Other Topics Concern   Not on file  Social History Narrative   Not on file   Social Determinants of Health   Financial Resource Strain: Low Risk  (01/01/2022)   Overall Financial Resource Strain (CARDIA)    Difficulty of Paying Living Expenses: Not hard at all  Food Insecurity: No Food Insecurity (01/01/2022)   Hunger Vital Sign    Worried About Running Out of Food in the Last Year: Never true    [place] in the Last Year: Never true  Transportation Needs: No Transportation Needs (01/01/2022)   PRAPARE - Hydrologist (Medical): No    Lack of Transportation (Non-Medical): No  Physical Activity: Unknown (01/01/2022)   Exercise Vital Sign    Days of Exercise per Week: 0 days    Minutes of Exercise per Session: Not on file  Stress: No Stress Concern Present (01/01/2022)   [place]    Feeling of Stress : Not at all  Social Connections: Socially Isolated (01/01/2022)   Social Connection and Isolation Panel [NHANES]    Frequency of Communication with Friends and Family: More than three times a week    Frequency of Social Gatherings with Friends and Family: Once a week    Attends Religious Services: Never    Marine scientist or Organizations: No    Attends Archivist Meetings: Never    Marital Status: Never married  Intimate Partner Violence: Not At Risk (08/04/2020)   Humiliation, Afraid, Rape, and Kick questionnaire    Fear of Current or Ex-Partner: No    Emotionally Abused: No    Physically Abused: No    Sexually Abused: No    Review of Systems:    Constitutional: No weight loss, fever or chills Skin: No rash   Cardiovascular: No chest pain Respiratory: No SOB  Gastrointestinal: See HPI and otherwise negative Genitourinary: No dysuria Neurological: No headache, dizziness or syncope Musculoskeletal: No new muscle or joint pain Hematologic: No bleeding Psychiatric: No history of depression or anxiety   Physical Exam:  Vital signs: BP (!) 150/97   Pulse 68   Ht '6\' 3"'$  (1.905 m)   Wt 242 lb (109.8 kg)   BMI 30.25 kg/m    Constitutional:   Pleasant  AA male appears to be in NAD, Well developed, Well nourished, alert and cooperative Head:  Normocephalic and atraumatic. Eyes:   PEERL, EOMI. No icterus. Conjunctiva pink. Ears:  Normal auditory acuity. Neck:  Supple Throat: Oral cavity and pharynx without inflammation, swelling or lesion.  Respiratory: Respirations even and unlabored. Lungs clear to auscultation bilaterally.   No wheezes, crackles, or rhonchi.  Cardiovascular: Normal S1, S2. No MRG. Regular rate and rhythm. No peripheral edema, cyanosis or pallor.  Gastrointestinal:  Soft, nondistended, nontender. No rebound or guarding. Normal bowel sounds. No appreciable masses or hepatomegaly. Rectal:  Not performed.  Msk:  Symmetrical without gross deformities. Without edema, no deformity or joint abnormality.  Neurologic:  Alert and  oriented x4;  grossly normal neurologically.  Skin:   Dry and intact without significant lesions or rashes. Psychiatric:Demonstrates good judgement and reason without abnormal affect or behaviors.  RELEVANT LABS AND IMAGING: CBC    Component Value Date/Time   WBC 5.2 12/03/2021 1409   RBC 4.91 12/03/2021 1409   HGB 14.0 12/03/2021 1409   HGB 13.6 07/27/2021 1435   HCT 43.1 12/03/2021 1409   HCT 41.4 07/27/2021 1435   PLT 208 12/03/2021 1409   PLT 203 07/27/2021 1435   MCV 87.8 12/03/2021 1409   MCV 86 07/27/2021 1435   MCH 28.5 12/03/2021 1409   MCHC 32.5 12/03/2021 1409   RDW 14.9 12/03/2021 1409   RDW 14.4 07/27/2021 1435   LYMPHSABS 2.9  09/28/2018 1000   EOSABS 0.2 09/28/2018 1000   BASOSABS 0.1 09/28/2018 1000    CMP     Component Value Date/Time   NA 138 01/29/2022 1549   K 4.6 01/29/2022 1549   CL 102 01/29/2022 1549   CO2 22 01/29/2022 1549   GLUCOSE 75 01/29/2022 1549   GLUCOSE 115 (H) 12/03/2021 1520   BUN 17 01/29/2022 1549   CREATININE 1.57 (H) 01/29/2022 1549   CALCIUM 9.2 01/29/2022 1549   PROT 6.6 07/27/2021 1435   ALBUMIN 4.5 07/27/2021 1435   AST 23 07/27/2021 1435   ALT 19 07/27/2021 1435   ALKPHOS 82 07/27/2021 1435   BILITOT 0.3 07/27/2021 1435   GFRNONAA 34 (L) 12/03/2021 1520   GFRAA 69 08/30/2019 0943    Assessment: 1.  History of adenomatous polyps: See HPI, last colonoscopy in 2018 was normal, prior to that history of adenomatous polyps, repeat recommended by Dr. Loletha Carrow for surveillance 2.  Chronic anticoagulation for A-fib: On Eliquis  Plan: 1.  Patient scheduled for surveillance colonoscopy in the [facility] with Dr. Loletha Carrow.  Did provide the patient a detailed list of risks for the procedure and he agrees to proceed. Patient is appropriate for endoscopic procedure(s) in the ambulatory ([place]) setting.  2.  Patient advised to hold his Eliquis for 2 days prior to time of procedure.  We will communicate with his prescribing physician to ensure this is acceptable for him. 3.  Patient given information on the [facility] services to drive him here if needed. 4.  Patient to follow in clinic per recommendations after time of procedure.  Ellouise Newer, PA-C [facility] 02/05/2022, 11:33 AM  Cc: Ladell Pier, MD

## 2022-02-05 NOTE — Telephone Encounter (Signed)
Dr Wynetta Emery you prescribed Eliquis to this patient and we are trying to see if you are willing to hold this medication for 2 days in order to have a colonoscopy on 04/02/22. Please respond as soon as you can regarding this message.   [facility] Pre-operative Risk Assessment     Request for surgical clearance:     Endoscopy Procedure  What type of surgery is being performed? Colonoscopy  When is this surgery scheduled?     04/02/22  What type of clearance is required ?   Pharmacy  Are there any medications that need to be held prior to surgery and how long? Eliquis x2 days  Practice name and name of physician performing surgery?      [facility]  What is your office phone and fax number?      Phone- [phone]  [phone]  Anesthesia type (None, local, MAC, general) ?       MAC

## 2022-02-08 NOTE — Progress Notes (Signed)
____________________________________________________________  Attending physician addendum:  Thank you for sending this case to me. I have reviewed the entire note and agree with the plan.   March Joos Danis, MD  ____________________________________________________________  

## 2022-02-09 ENCOUNTER — Telehealth: Payer: Self-pay

## 2022-02-09 NOTE — Telephone Encounter (Signed)
Spoke to patient advise per provider Dr Wynetta Emery it is okay to hold Eliquis for 3 days to include day of the colonoscopy. Patient verbalized understanding.

## 2022-02-12 ENCOUNTER — Other Ambulatory Visit: Payer: Self-pay

## 2022-02-12 ENCOUNTER — Other Ambulatory Visit: Payer: Self-pay | Admitting: Internal Medicine

## 2022-02-12 DIAGNOSIS — N401 Enlarged prostate with lower urinary tract symptoms: Secondary | ICD-10-CM

## 2022-02-12 MED ORDER — TAMSULOSIN HCL 0.4 MG PO CAPS
0.8000 mg | ORAL_CAPSULE | Freq: Every day | ORAL | 1 refills | Status: DC
  Filled 2022-02-12: qty 180, 90d supply, fill #0
  Filled 2022-05-10: qty 180, 90d supply, fill #1

## 2022-02-12 NOTE — Telephone Encounter (Signed)
Requested medications are due for refill today.  yes  Requested medications are on the active medications list.  yes  Last refill. 11/06/2021 #180 0 rf  Future visit scheduled.   no  Notes to clinic.  Missing labs    Requested Prescriptions  Pending Prescriptions Disp Refills   tamsulosin (FLOMAX) 0.4 MG CAPS capsule 180 capsule 0    Sig: TAKE 2 CAPSULES (0.8 MG TOTAL) BY MOUTH AT BEDTIME.     Urology: Alpha-Adrenergic Blocker Failed - 02/12/2022  9:12 AM      Failed - PSA in normal range and within 360 days    No results found for: "LABPSA", "PSA", "PSA1", "ULTRAPSA"       Failed - Last BP in normal range    BP Readings from Last 1 Encounters:  02/05/22 (!) 150/97         Passed - Valid encounter within last 12 months    Recent Outpatient Visits           2 weeks ago Essential hypertension   [place], MD   1 month ago Essential hypertension   [place], Jarome Matin, RPH-CPP   2 months ago Essential hypertension   [place] Ladell Pier, MD   6 months ago Essential hypertension   [place] Ladell Pier, MD   1 year ago Essential hypertension   [place] Ladell Pier, MD

## 2022-02-15 ENCOUNTER — Other Ambulatory Visit: Payer: Self-pay

## 2022-02-17 ENCOUNTER — Other Ambulatory Visit: Payer: Self-pay

## 2022-02-23 DIAGNOSIS — H5213 Myopia, bilateral: Secondary | ICD-10-CM | POA: Diagnosis not present

## 2022-02-23 DIAGNOSIS — H52223 Regular astigmatism, bilateral: Secondary | ICD-10-CM | POA: Diagnosis not present

## 2022-02-23 DIAGNOSIS — H2513 Age-related nuclear cataract, bilateral: Secondary | ICD-10-CM | POA: Diagnosis not present

## 2022-02-23 DIAGNOSIS — H04123 Dry eye syndrome of bilateral lacrimal glands: Secondary | ICD-10-CM | POA: Diagnosis not present

## 2022-02-23 DIAGNOSIS — H524 Presbyopia: Secondary | ICD-10-CM | POA: Diagnosis not present

## 2022-02-23 DIAGNOSIS — H35033 Hypertensive retinopathy, bilateral: Secondary | ICD-10-CM | POA: Diagnosis not present

## 2022-03-01 ENCOUNTER — Other Ambulatory Visit: Payer: Self-pay

## 2022-03-01 NOTE — Progress Notes (Signed)
Patient outreached by Levi Aland, PharmD Candidate on 03/01/22 to discuss hypertension   Patient has an automated home blood pressure machine. They do not report any current home readings. Mentioned the latest BP in office being high and patient stated that they would measure their BP once they got home. BP is typically controlled at prior visits. Pt attributes recent high reading to stress and not taking his BP medications earlier (he states he had only taken them right before the appointment).   Medication review was performed. They are taking medications as prescribed. Differences from their prescribed list include: N/A   The following barriers to adherence were noted:  - They do not have cost concerns.  - They do have transportation concerns. Concerns mainly relate to getting to appointments but was addressed at last visit with gastro.  - They do not need assistance obtaining refills.  - They do not occasionally forget to take some of their prescribed medications.  - They do not feel like one/some of their medications make them feel poorly. Stated he used to get dizzy sometimes after standing up but no longer does.  - They do not have questions or concerns about their medications.  - They do not have follow up scheduled with their primary care provider/cardiologist.   The following interventions were completed:  - Medications were reviewed  - Patient was educated on goal blood pressures and long term health implications of elevated blood pressure  - Patient was educated on proper technique to check home blood pressure and reminded to bring home machine and readings to next provider appointment  - Patient was educated on medications, including indication and administration   The patient has follow up scheduled: N/A just had OV with PCP 01/29/22 where no concerns where observed. Only upcoming appt for now is with gastro for colonoscopy  PCP: Baron Sane, PharmD  Candidate, Class of [phone]  [place], [place].D. PGY-2 Ambulatory Care Pharmacy Resident 03/01/2022 1:29 PM

## 2022-03-03 ENCOUNTER — Encounter: Payer: Self-pay | Admitting: Internal Medicine

## 2022-03-03 DIAGNOSIS — H35039 Hypertensive retinopathy, unspecified eye: Secondary | ICD-10-CM | POA: Insufficient documentation

## 2022-03-04 DIAGNOSIS — G4733 Obstructive sleep apnea (adult) (pediatric): Secondary | ICD-10-CM | POA: Diagnosis not present

## 2022-03-08 ENCOUNTER — Other Ambulatory Visit: Payer: Self-pay

## 2022-03-08 ENCOUNTER — Other Ambulatory Visit: Payer: Self-pay | Admitting: Internal Medicine

## 2022-03-08 DIAGNOSIS — M1A09X0 Idiopathic chronic gout, multiple sites, without tophus (tophi): Secondary | ICD-10-CM

## 2022-03-09 ENCOUNTER — Other Ambulatory Visit: Payer: Self-pay

## 2022-03-09 NOTE — Telephone Encounter (Signed)
Requested medication (s) are due for refill today: yes  Requested medication (s) are on the active medication list: yes  Last refill:  07/27/21  Future visit scheduled: yes  Notes to clinic:  Unable to refill per protocol due to failed labs, no updated results.      Requested Prescriptions  Pending Prescriptions Disp Refills   allopurinol (ZYLOPRIM) 300 MG tablet 30 tablet 6    Sig: TAKE 1 TABLET (300 MG TOTAL) BY MOUTH DAILY. TO TREAT GOUT     Endocrinology:  Gout Agents - allopurinol Failed - 03/08/2022  8:21 AM      Failed - Uric Acid in normal range and within 360 days    Uric Acid  Date Value Ref Range Status  09/28/2018 3.4 (L) 3.7 - 8.6 mg/dL Final    Comment:               Therapeutic target for gout patients: <6.0         Failed - Cr in normal range and within 360 days    Creatinine, Ser  Date Value Ref Range Status  01/29/2022 1.57 (H) 0.76 - 1.27 mg/dL Final         Failed - CBC within normal limits and completed in the last 12 months    WBC  Date Value Ref Range Status  12/03/2021 5.2 4.0 - 10.5 K/uL Final   RBC  Date Value Ref Range Status  12/03/2021 4.91 4.22 - 5.81 MIL/uL Final   Hemoglobin  Date Value Ref Range Status  12/03/2021 14.0 13.0 - 17.0 g/dL Final  07/27/2021 13.6 13.0 - 17.7 g/dL Final   HCT  Date Value Ref Range Status  12/03/2021 43.1 39.0 - 52.0 % Final   Hematocrit  Date Value Ref Range Status  07/27/2021 41.4 37.5 - 51.0 % Final   MCHC  Date Value Ref Range Status  12/03/2021 32.5 30.0 - 36.0 g/dL Final   [facility]  Date Value Ref Range Status  12/03/2021 28.5 26.0 - 34.0 pg Final   MCV  Date Value Ref Range Status  12/03/2021 87.8 80.0 - 100.0 fL Final  07/27/2021 86 79 - 97 fL Final   No results found for: "PLTCOUNTKUC", "LABPLAT", "POCPLA" RDW  Date Value Ref Range Status  12/03/2021 14.9 11.5 - 15.5 % Final  07/27/2021 14.4 11.6 - 15.4 % Final         Passed - Valid encounter within last 12 months    Recent  Outpatient Visits           1 month ago Essential hypertension   [place], MD   2 months ago Essential hypertension   [place], Jarome Matin, RPH-CPP   3 months ago Essential hypertension   [place] Ladell Pier, MD   7 months ago Essential hypertension   [place] Ladell Pier, MD   1 year ago Essential hypertension   [place] Ladell Pier, MD

## 2022-03-10 ENCOUNTER — Other Ambulatory Visit: Payer: Self-pay

## 2022-03-10 MED ORDER — ALLOPURINOL 300 MG PO TABS
ORAL_TABLET | ORAL | 6 refills | Status: DC
  Filled 2022-03-10: qty 30, 30d supply, fill #0
  Filled 2022-04-04: qty 30, 30d supply, fill #1
  Filled 2022-05-03: qty 30, 30d supply, fill #2
  Filled 2022-06-02: qty 30, 30d supply, fill #3
  Filled 2022-06-29: qty 30, 30d supply, fill #4
  Filled 2022-07-26: qty 30, 30d supply, fill #5
  Filled 2022-08-24: qty 30, 30d supply, fill #6

## 2022-03-11 ENCOUNTER — Other Ambulatory Visit: Payer: Self-pay

## 2022-03-14 ENCOUNTER — Other Ambulatory Visit: Payer: Self-pay | Admitting: Internal Medicine

## 2022-03-14 DIAGNOSIS — M1A09X0 Idiopathic chronic gout, multiple sites, without tophus (tophi): Secondary | ICD-10-CM

## 2022-03-15 ENCOUNTER — Other Ambulatory Visit: Payer: Self-pay

## 2022-03-15 MED ORDER — COLCHICINE 0.6 MG PO TABS
0.6000 mg | ORAL_TABLET | Freq: Every day | ORAL | 0 refills | Status: DC
  Filled 2022-03-15: qty 90, 90d supply, fill #0

## 2022-03-16 ENCOUNTER — Other Ambulatory Visit: Payer: Self-pay

## 2022-03-24 ENCOUNTER — Other Ambulatory Visit: Payer: Self-pay

## 2022-03-25 ENCOUNTER — Encounter: Payer: Self-pay | Admitting: Gastroenterology

## 2022-03-27 ENCOUNTER — Telehealth: Payer: Self-pay | Admitting: *Deleted

## 2022-03-27 DIAGNOSIS — Z86010 Personal history of colonic polyps: Secondary | ICD-10-CM

## 2022-03-27 DIAGNOSIS — I480 Paroxysmal atrial fibrillation: Secondary | ICD-10-CM

## 2022-03-27 DIAGNOSIS — Z7901 Long term (current) use of anticoagulants: Secondary | ICD-10-CM

## 2022-03-27 NOTE — Telephone Encounter (Signed)
Dr. Loletha Carrow,  This pt is a documented difficult intubation and his procedure will need to be done at the hospital.  Thanks,  Osvaldo Angst

## 2022-03-28 NOTE — Telephone Encounter (Signed)
[place],   Please see note from anesthesia about why this patient's colonoscopy must be moved to the hospital outpatient endoscopy lab.  If he wishes to speak with Osvaldo Angst about this for more information, I am sure Jenny Reichmann would be happy to do so upon request.  I have time open in my April and May [facility] blocks for this procedure.  - HD

## 2022-03-29 ENCOUNTER — Other Ambulatory Visit: Payer: Self-pay

## 2022-03-29 NOTE — Telephone Encounter (Signed)
I did speak with the pt and advised him that the case this week will be cancelled.  He will await a call from [place] to set up procedures at the hospital.

## 2022-03-30 ENCOUNTER — Other Ambulatory Visit: Payer: Self-pay

## 2022-03-31 ENCOUNTER — Other Ambulatory Visit: Payer: Self-pay

## 2022-03-31 ENCOUNTER — Ambulatory Visit
Admission: EM | Admit: 2022-03-31 | Discharge: 2022-03-31 | Disposition: A | Discharge status: Home / Self Care | Payer: Medicare HMO | Attending: Family Medicine | Admitting: Family Medicine

## 2022-03-31 DIAGNOSIS — M25511 Pain in right shoulder: Secondary | ICD-10-CM | POA: Diagnosis not present

## 2022-03-31 MED ORDER — DEXAMETHASONE SODIUM PHOSPHATE 10 MG/ML IJ SOLN
10.0000 mg | Freq: Once | INTRAMUSCULAR | Status: AC
  Administered 2022-03-31: 10 mg via INTRAMUSCULAR

## 2022-03-31 MED ORDER — IBUPROFEN 800 MG PO TABS
800.0000 mg | ORAL_TABLET | Freq: Once | ORAL | Status: AC
  Administered 2022-03-31: 800 mg via ORAL

## 2022-03-31 NOTE — ED Triage Notes (Signed)
Pt presents with right shoulder pain this evening with no known injury.

## 2022-03-31 NOTE — Discharge Instructions (Signed)
Meds ordered this encounter  Medications   ibuprofen (ADVIL) tablet 800 mg   dexamethasone (DECADRON) injection 10 mg

## 2022-04-01 ENCOUNTER — Other Ambulatory Visit: Payer: Self-pay

## 2022-04-01 ENCOUNTER — Encounter: Payer: Self-pay | Admitting: Gastroenterology

## 2022-04-01 DIAGNOSIS — Z7901 Long term (current) use of anticoagulants: Secondary | ICD-10-CM

## 2022-04-01 DIAGNOSIS — Z86010 Personal history of colonic polyps: Secondary | ICD-10-CM

## 2022-04-01 DIAGNOSIS — I480 Paroxysmal atrial fibrillation: Secondary | ICD-10-CM

## 2022-04-01 NOTE — Telephone Encounter (Signed)
Called and spoke with patient. We have scheduled his colonoscopy at [facility] on Tuesday, 04/13/22 at 11 am. Patient knows to arrive at [facility] by 9:30 am with a care partner. Pt will stop by the 2nd floor receptionist desk tomorrow to pick up updated prep instructions. Pt verbalized understanding and had no concerns at the end of the call.  Ambulatory referral to GI in epic. Updated instructions printed and placed at 2nd floor receptionist desk.

## 2022-04-02 ENCOUNTER — Other Ambulatory Visit: Payer: Self-pay

## 2022-04-02 ENCOUNTER — Encounter: Payer: Medicare HMO | Admitting: Gastroenterology

## 2022-04-02 DIAGNOSIS — G4733 Obstructive sleep apnea (adult) (pediatric): Secondary | ICD-10-CM | POA: Diagnosis not present

## 2022-04-03 NOTE — ED Provider Notes (Signed)
[facility]   [id number] 03/31/22 Arrival Time: [id number]  ASSESSMENT & PLAN:  1. Acute pain of right shoulder    No indication for plain imaging at this time. No trauma. Likely overuse. Discussed. Question biceps tendonitis.  Meds ordered this encounter  Medications   ibuprofen (ADVIL) tablet 800 mg   dexamethasone (DECADRON) injection 10 mg   Encouraged him to work on ROM. Work/school excuse note: provided. Recommend:  Follow-up Information     [facility].   Why: If worsening or failing to improve as anticipated. Contact information: [address] [place] [id number]                Reviewed expectations re: course of current medical issues. Questions answered. Outlined signs and symptoms indicating need for more acute intervention. Patient verbalized understanding. After Visit Summary given.  SUBJECTIVE: History from: patient. Albert Good. is a 67 y.o. male who reports RIGHT shoulder pain; non-traumatic; noticed this evening after work. Lifts a lot at work. No extremity sensation changes or weakness. Pain described as an "aching". No tx PTA.   Past Surgical History:  Procedure Laterality Date   LIPOSUCTION     ROBOTIC ASSITED PARTIAL NEPHRECTOMY Right 12/22/2020   Procedure: XI ROBOTIC ASSITED  NEPHRECTOMY;  Surgeon: Raynelle Bring, MD;  [facility];  Service: Urology;  Laterality: Right;      OBJECTIVE:  Vitals:   03/31/22 1823  BP: (!) 141/81  Pulse: 66  Resp: 18  Temp: 97.7 F (36.5 C)  TempSrc: Oral  SpO2: 93%    General appearance: alert; no distress HEENT: [place]; AT Neck: supple with FROM Resp: unlabored respirations Extremities: RUE: warm with well perfused appearance; poorly localized moderate tenderness over right anterior shoulder; without gross deformities; swelling: none; bruising: none; shoulder ROM: normal CV: brisk extremity capillary refill of RUE;  2+ radial pulse of RUE. Skin: warm and dry; no visible rashes Neurologic: gait normal; normal sensation and strength of all extremities Psychological: alert and cooperative; normal mood and affect     Allergies  Allergen Reactions   Other Hives    HAIR DYE    Past Medical History:  Diagnosis Date   BPH associated with nocturia 03/12/2018   BPH with obstruction/lower urinary tract symptoms    Chronic gout of multiple sites 03/12/2018   Dysrhythmia    Afib   Essential hypertension    Gout    Long-term use of aspirin therapy 03/12/2018   OSA (obstructive sleep apnea)    uses Cpap   Paroxysmal atrial fibrillation (HCC)    Social History   Socioeconomic History   Marital status: Single    Spouse name: Not on file   Number of children: 10   Years of education: Not on file   Highest education level: Not on file  Occupational History   Occupation: flagger  Tobacco Use   Smoking status: Former    Years: 30    Types: Cigarettes   Smokeless tobacco: Former    Types: Snuff    Quit date: 11/13/2020  Vaping Use   Vaping Use: Never used  Substance and Sexual Activity   Alcohol use: Yes    Comment: 2 every other day   Drug use: Not Currently   Sexual activity: Yes    Birth control/protection: None  Other Topics Concern   Not on file  Social History Narrative   Not on file   Social Determinants of Health  Financial Resource Strain: Low Risk  (01/01/2022)   Overall Financial Resource Strain (CARDIA)    Difficulty of Paying Living Expenses: Not hard at all  Food Insecurity: No Food Insecurity (01/01/2022)   Hunger Vital Sign    Worried About Running Out of Food in the Last Year: Never true    Ran Out of Food in the Last Year: Never true  Transportation Needs: No Transportation Needs (01/01/2022)   PRAPARE - Hydrologist (Medical): No    Lack of Transportation (Non-Medical): No  Physical Activity: Unknown (01/01/2022)   Exercise Vital  Sign    Days of Exercise per Week: 0 days    Minutes of Exercise per Session: Not on file  Stress: No Stress Concern Present (01/01/2022)   [place]    Feeling of Stress : Not at all  Social Connections: Socially Isolated (01/01/2022)   Social Connection and Isolation Panel [NHANES]    Frequency of Communication with Friends and Family: More than three times a week    Frequency of Social Gatherings with Friends and Family: Once a week    Attends Religious Services: Never    Marine scientist or Organizations: No    Attends Music therapist: Never    Marital Status: Never married   Family History  Problem Relation Age of Onset   Diabetes Mother    Hypertension Father    Diabetes Sister    Diabetes Brother    Heart failure Brother    Kidney failure Brother    Colon cancer Neg Hx    Stomach cancer Neg Hx    Esophageal cancer Neg Hx    Past Surgical History:  Procedure Laterality Date   LIPOSUCTION     ROBOTIC ASSITED PARTIAL NEPHRECTOMY Right 12/22/2020   Procedure: XI ROBOTIC ASSITED  NEPHRECTOMY;  Surgeon: Raynelle Bring, MD;  [facility];  Service: Urology;  Laterality: Right;       Vanessa Kick, MD 04/03/22 [phone]

## 2022-04-06 ENCOUNTER — Encounter (HOSPITAL_COMMUNITY): Payer: Self-pay | Admitting: Gastroenterology

## 2022-04-13 ENCOUNTER — Ambulatory Visit (HOSPITAL_COMMUNITY)
Admission: RE | Admit: 2022-04-13 | Discharge: 2022-04-13 | Disposition: A | Discharge status: Home / Self Care | Payer: Medicare HMO | Attending: Gastroenterology | Admitting: Gastroenterology

## 2022-04-13 ENCOUNTER — Encounter (HOSPITAL_COMMUNITY)
Admission: RE | Disposition: A | Discharge status: Home / Self Care | Payer: Self-pay | Source: Home / Self Care | Attending: Gastroenterology

## 2022-04-13 ENCOUNTER — Other Ambulatory Visit: Payer: Self-pay

## 2022-04-13 ENCOUNTER — Ambulatory Visit (HOSPITAL_COMMUNITY): Payer: Medicare HMO | Admitting: Registered Nurse

## 2022-04-13 ENCOUNTER — Encounter (HOSPITAL_COMMUNITY): Payer: Self-pay | Admitting: Gastroenterology

## 2022-04-13 ENCOUNTER — Ambulatory Visit (HOSPITAL_BASED_OUTPATIENT_CLINIC_OR_DEPARTMENT_OTHER): Payer: Medicare HMO | Admitting: Registered Nurse

## 2022-04-13 DIAGNOSIS — I10 Essential (primary) hypertension: Secondary | ICD-10-CM | POA: Diagnosis not present

## 2022-04-13 DIAGNOSIS — G4733 Obstructive sleep apnea (adult) (pediatric): Secondary | ICD-10-CM | POA: Insufficient documentation

## 2022-04-13 DIAGNOSIS — Z1211 Encounter for screening for malignant neoplasm of colon: Secondary | ICD-10-CM | POA: Diagnosis not present

## 2022-04-13 DIAGNOSIS — I480 Paroxysmal atrial fibrillation: Secondary | ICD-10-CM | POA: Diagnosis not present

## 2022-04-13 DIAGNOSIS — Z87891 Personal history of nicotine dependence: Secondary | ICD-10-CM | POA: Diagnosis not present

## 2022-04-13 DIAGNOSIS — Z86010 Personal history of colonic polyps: Secondary | ICD-10-CM | POA: Insufficient documentation

## 2022-04-13 DIAGNOSIS — D122 Benign neoplasm of ascending colon: Secondary | ICD-10-CM

## 2022-04-13 DIAGNOSIS — G4730 Sleep apnea, unspecified: Secondary | ICD-10-CM | POA: Diagnosis not present

## 2022-04-13 DIAGNOSIS — Z7901 Long term (current) use of anticoagulants: Secondary | ICD-10-CM | POA: Diagnosis not present

## 2022-04-13 DIAGNOSIS — K648 Other hemorrhoids: Secondary | ICD-10-CM | POA: Insufficient documentation

## 2022-04-13 DIAGNOSIS — Z09 Encounter for follow-up examination after completed treatment for conditions other than malignant neoplasm: Secondary | ICD-10-CM | POA: Insufficient documentation

## 2022-04-13 HISTORY — PX: POLYPECTOMY: SHX5525

## 2022-04-13 HISTORY — PX: COLONOSCOPY WITH PROPOFOL: SHX5780

## 2022-04-13 SURGERY — COLONOSCOPY WITH PROPOFOL
Anesthesia: Monitor Anesthesia Care

## 2022-04-13 MED ORDER — PROPOFOL 1000 MG/100ML IV EMUL
INTRAVENOUS | Status: AC
  Filled 2022-04-13: qty 100

## 2022-04-13 MED ORDER — SODIUM CHLORIDE 0.9 % IV SOLN: INTRAVENOUS | Status: DC

## 2022-04-13 MED ORDER — LACTATED RINGERS IV SOLN
INTRAVENOUS | Status: AC | PRN
  Administered 2022-04-13: 1000 mL via INTRAVENOUS

## 2022-04-13 MED ORDER — PROPOFOL 500 MG/50ML IV EMUL
INTRAVENOUS | Status: DC | PRN
  Administered 2022-04-13: 130 ug/kg/min via INTRAVENOUS

## 2022-04-13 SURGICAL SUPPLY — 22 items

## 2022-04-13 NOTE — Anesthesia Procedure Notes (Signed)
Procedure Name: MAC Date/Time: 04/13/2022 11:32 AM  Performed by: Victoriano Lain, CRNAPre-anesthesia Checklist: Patient identified, Emergency Drugs available, Suction available, Patient being monitored and Timeout performed Oxygen Delivery Method: Simple face mask Placement Confirmation: positive ETCO2 Dental Injury: Teeth and Oropharynx as per pre-operative assessment

## 2022-04-13 NOTE — Anesthesia Preprocedure Evaluation (Addendum)
Anesthesia Evaluation  Patient identified by MRN, date of birth, ID band Patient awake    Reviewed: Allergy & Precautions, NPO status , Patient's Chart, lab work & pertinent test results, reviewed documented beta blocker date and time   History of Anesthesia Complications Negative for: history of anesthetic complications  Airway Mallampati: II  TM Distance: >3 FB Neck ROM: Full    Dental  (+) Dental Advisory Given, Missing   Pulmonary sleep apnea and Continuous Positive Airway Pressure Ventilation , former smoker   breath sounds clear to auscultation       Cardiovascular hypertension, Pt. on medications and Pt. on home beta blockers (-) angina + dysrhythmias Atrial Fibrillation  Rhythm:Irregular Rate:Normal  '20 Stress: Nuclear stress EF: 53%. The left ventricular ejection fraction is mildly decreased (45-54%). There was no ST segment deviation noted during stress. Baseline nonspecific ST abnormality present.  The study is normal.    Neuro/Psych negative neurological ROS     GI/Hepatic negative GI ROS, Neg liver ROS,,,  Endo/Other  BMI 30.3  Renal/GU      Musculoskeletal   Abdominal  (+) + obese  Peds  Hematology eliquis   Anesthesia Other Findings   Reproductive/Obstetrics                             Anesthesia Physical Anesthesia Plan  ASA: 3  Anesthesia Plan: MAC   Post-op Pain Management: Minimal or no pain anticipated   Induction:   PONV Risk Score and Plan: 1 and Treatment may vary due to age or medical condition  Airway Management Planned: Natural Airway and Simple Face Mask  Additional Equipment: None  Intra-op Plan:   Post-operative Plan:   Informed Consent: I have reviewed the patients History and Physical, chart, labs and discussed the procedure including the risks, benefits and alternatives for the proposed anesthesia with the patient or authorized  representative who has indicated his/her understanding and acceptance.     Dental advisory given  Plan Discussed with: CRNA and Surgeon  Anesthesia Plan Comments:         Anesthesia Quick Evaluation

## 2022-04-13 NOTE — H&P (Signed)
History and Physical:  This patient presents for endoscopic testing for: Hx colon polyps Clinical details in 02/01/22 office consult note.  Polyp history from prior records:  Colonoscopies in [place], procedure and pathology results in epic chart   August 2018 -no polyps, complete exam with good prep December 2015 -3 tubular adenomas 5 to 9 mm in size (removed with biopsy and "cauterized") February 2013 -2 diminutive tubular adenomas removed with biopsy  Patient has been off Eliquis 2 days prior to this procedure.  Patient is otherwise without complaints or active issues today.   Past Medical History: Past Medical History:  Diagnosis Date   BPH associated with nocturia 03/12/2018   BPH with obstruction/lower urinary tract symptoms    Chronic gout of multiple sites 03/12/2018   Dysrhythmia    Afib   Essential hypertension    Gout    Long-term use of aspirin therapy 03/12/2018   OSA (obstructive sleep apnea)    uses Cpap   Paroxysmal atrial fibrillation      Past Surgical History: Past Surgical History:  Procedure Laterality Date   LIPOSUCTION     ROBOTIC ASSITED PARTIAL NEPHRECTOMY Right 12/22/2020   Procedure: XI ROBOTIC ASSITED  NEPHRECTOMY;  Surgeon: Raynelle Bring, MD;  Location: [facility];  Service: Urology;  Laterality: Right;    Allergies: Allergies  Allergen Reactions   Other Hives    HAIR DYE  Lactose intolerant    Outpatient Meds: Current Facility-Administered Medications  Medication Dose Route Frequency Provider Last Rate Last Admin   0.9 %  sodium chloride infusion   Intravenous Continuous Danis, Estill Cotta III, MD       lactated ringers infusion    Continuous PRN Nelida Meuse III, MD 10 mL/hr at 04/13/22 1024 1,000 mL at 04/13/22 1024      ___________________________________________________________________ Objective   Exam:  BP (!) 166/93   Pulse 69   Temp (!) 97.3 F (36.3 C) (Tympanic)   Resp 18   Ht 6\' 3"  (1.905 m)   Wt 109.8 kg    SpO2 98%   BMI 30.26 kg/m   CV: regular , S1/S2 Resp: clear to auscultation bilaterally, normal RR and effort noted GI: soft, no tenderness, with active bowel sounds.   Assessment: Hx colon polyps   Plan: Colonoscopy  The benefits and risks of the planned procedure were described in detail with the patient or (when appropriate) their health care proxy.  Risks were outlined as including, but not limited to, bleeding, infection, perforation, adverse medication reaction leading to cardiac or pulmonary decompensation, pancreatitis (if ERCP).  The limitation of incomplete mucosal visualization was also discussed.  No guarantees or warranties were given.    The patient is appropriate for an endoscopic procedure in the ambulatory setting.   - Wilfrid Lund, MD

## 2022-04-13 NOTE — Anesthesia Postprocedure Evaluation (Signed)
Anesthesia Post Note  Patient: Albert Good.  Procedure(s) Performed: COLONOSCOPY WITH PROPOFOL POLYPECTOMY     Patient location during evaluation: Endoscopy Anesthesia Type: MAC Level of consciousness: awake and alert, patient cooperative and oriented Pain management: pain level controlled Vital Signs Assessment: post-procedure vital signs reviewed and stable Respiratory status: spontaneous breathing, nonlabored ventilation and respiratory function stable Cardiovascular status: blood pressure returned to baseline and stable Postop Assessment: no apparent nausea or vomiting, adequate PO intake and able to ambulate Anesthetic complications: no   No notable events documented.  Last Vitals:  Vitals:   04/13/22 1018 04/13/22 1157  BP: (!) 166/93 (!) 156/99  Pulse: 69 66  Resp: 18 (!) 23  Temp: (!) 36.3 C   SpO2: 98% 100%    Last Pain:  Vitals:   04/13/22 1157  TempSrc:   PainSc: 0-No pain                 Rosene Pilling,E. Hazle Ogburn

## 2022-04-13 NOTE — Interval H&P Note (Signed)
History and Physical Interval Note:  04/13/2022 11:26 AM  Albert Good.  has presented today for surgery, with the diagnosis of Chronic anticoagulation, Paroxysmal atrial fibrillation ([name]), History of adenomatous polyp of colon.  The various methods of treatment have been discussed with the patient and family. After consideration of risks, benefits and other options for treatment, the patient has consented to  Procedure(s): COLONOSCOPY WITH PROPOFOL (N/A) as a surgical intervention.  The patient's history has been reviewed, patient examined, no change in status, stable for surgery.  I have reviewed the patient's chart and labs.  Questions were answered to the patient's satisfaction.     Nelida Meuse III

## 2022-04-13 NOTE — Interval H&P Note (Signed)
History and Physical Interval Note:  04/13/2022 11:20 AM  Albert Good Albert Good.  has presented today for surgery, with the diagnosis of Chronic anticoagulation, Paroxysmal atrial fibrillation ([place]), History of adenomatous polyp of colon.  The various methods of treatment have been discussed with the patient and family. After consideration of risks, benefits and other options for treatment, the patient has consented to  Procedure(s): COLONOSCOPY WITH PROPOFOL (N/A) as a surgical intervention.  The patient's history has been reviewed, patient examined, no change in status, stable for surgery.  I have reviewed the patient's chart and labs.  Questions were answered to the patient's satisfaction.     Nelida Meuse III

## 2022-04-13 NOTE — Transfer of Care (Signed)
Immediate Anesthesia Transfer of Care Note  Patient: Albert Good.  Procedure(s) Performed: COLONOSCOPY WITH PROPOFOL POLYPECTOMY  Patient Location: PACU  Anesthesia Type:MAC  Level of Consciousness: sedated  Airway & Oxygen Therapy: Patient Spontanous Breathing and Patient connected to face mask oxygen  Post-op Assessment: Report given to RN and Post -op Vital signs reviewed and stable  Post vital signs: Reviewed and stable  Last Vitals:  Vitals Value Taken Time  BP    Temp    Pulse 63 04/13/22 1154  Resp 18 04/13/22 1154  SpO2 100 % 04/13/22 1154  Vitals shown include unvalidated device data.  Last Pain:  Vitals:   04/13/22 1018  TempSrc: Tympanic  PainSc: 0-No pain         Complications: No notable events documented.

## 2022-04-13 NOTE — Discharge Instructions (Addendum)
YOU HAD AN ENDOSCOPIC PROCEDURE TODAY: Refer to the procedure report and other information in the discharge instructions given to you for any specific questions about what was found during the examination. If this information does not answer your questions, please call [place] office at [phone] to clarify.   YOU SHOULD EXPECT: Some feelings of bloating in the abdomen. Passage of more gas than usual. Walking can help get rid of the air that was put into your GI tract during the procedure and reduce the bloating. If you had a lower endoscopy (such as a colonoscopy or flexible sigmoidoscopy) you may notice spotting of blood in your stool or on the toilet paper. Some abdominal soreness may be present for a day or two, also.  DIET: Your first meal following the procedure should be a light meal and then it is ok to progress to your normal diet. A half-sandwich or bowl of soup is an example of a good first meal. Heavy or fried foods are harder to digest and may make you feel nauseous or bloated. Drink plenty of fluids but you should avoid alcoholic beverages for 24 hours. If you had a esophageal dilation, please see attached instructions for diet.    ACTIVITY: Your care partner should take you home directly after the procedure. You should plan to take it easy, moving slowly for the rest of the day. You can resume normal activity the day after the procedure however YOU SHOULD NOT DRIVE, use power tools, machinery or perform tasks that involve climbing or major physical exertion for 24 hours (because of the sedation medicines used during the test).   SYMPTOMS TO REPORT IMMEDIATELY: A gastroenterologist can be reached at any hour. Please call [phone]  for any of the following symptoms:  Following lower endoscopy (colonoscopy, flexible sigmoidoscopy) Excessive amounts of blood in the stool  Significant tenderness, worsening of abdominal pains  Swelling of the abdomen that is new, acute  Fever of 100 or  higher  Following upper endoscopy (EGD, EUS, ERCP, esophageal dilation) Vomiting of blood or coffee ground material  New, significant abdominal pain  New, significant chest pain or pain under the shoulder blades  Painful or persistently difficult swallowing  New shortness of breath  Black, tarry-looking or red, bloody stools  FOLLOW UP:  If any biopsies were taken you will be contacted by phone or by letter within the next 1-3 weeks. Call [phone]  if you have not heard about the biopsies in 3 weeks.  Please also call with any specific questions about appointments or follow up tests.     [facility] ENDOSCOPY [address] [place], [place]  [address] Phone:  [phone]   April 13, 2022  Patient: Albert Good.  Date of Birth: November 06, 1955  Date of Visit: April 13, 2022    To Whom It May Concern:  Torrien Morrisey was seen and treated on April 13, 2022 and    may return to work on April 4th.          If you have any questions or concerns, please don't hesitate to call.   Sincerely,       Treatment Team:  Attending Provider: Doran Stabler, MD

## 2022-04-13 NOTE — Op Note (Addendum)
[facility] Patient Name: Albert Good Procedure Date: 04/13/2022 MRN: [id number] Attending MD: Estill Cotta. Loletha Carrow , MD, [id number] Date of Birth: 06-Dec-1955 CSN: [id number] Age: 67 Admit Type: Outpatient Procedure:                Colonoscopy Indications:              Surveillance: Personal history of adenomatous                            polyps on last colonoscopy > 5 years ago                           Subcentimeter adenomatous polyps in 2015 and 2013.                            [place] -details                            in 02/01/2022 office consult note.                           Procedure being done in hospital outpatient                            endoscopy department due to anesthesia record                            review indicating history of difficult intubation. Providers:                Estill Cotta. Loletha Carrow, MD, Fanny Skates RN, RN, William Dalton, Technician Referring MD:             Ladell Pier Medicines:                Monitored Anesthesia Care Complications:            No immediate complications. Estimated Blood Loss:     Estimated blood loss was minimal. Procedure:                Pre-Anesthesia Assessment:                           - Prior to the procedure, a History and Physical                            was performed, and patient medications and                            allergies were reviewed. The patient's tolerance of                            previous anesthesia was also reviewed. The risks                            and benefits  of the procedure and the sedation                            options and risks were discussed with the patient.                            All questions were answered, and informed consent                            was obtained. Prior Anticoagulants: The patient has                            taken Eliquis (apixaban), last dose was 2 days                             prior to procedure. ASA Grade Assessment: III - A                            patient with severe systemic disease. After                            reviewing the risks and benefits, the patient was                            deemed in satisfactory condition to undergo the                            procedure.                           After obtaining informed consent, the colonoscope                            was passed under direct vision. Throughout the                            procedure, the patient's blood pressure, pulse, and                            oxygen saturations were monitored continuously. The                            [id number]) Olympus colonoscope was                            introduced through the anus and advanced to the the                            cecum, identified by appendiceal orifice and                            ileocecal valve. The colonoscopy was performed  without difficulty. The patient tolerated the                            procedure well. The quality of the bowel                            preparation was excellent. The ileocecal valve,                            appendiceal orifice, and rectum were photographed. Scope In: 11:36:06 AM Scope Out: 11:48:59 AM Scope Withdrawal Time: 0 hours 9 minutes 48 seconds  Total Procedure Duration: 0 hours 12 minutes 53 seconds  Findings:      The perianal and digital rectal examinations were normal.      Repeat examination of right colon under NBI performed.      A diminutive polyp was found in the ascending colon. The polyp was       sessile. The polyp was removed with a cold snare. Resection and       retrieval were complete.      Internal hemorrhoids were found.      The exam was otherwise without abnormality on direct and retroflexion       views. Impression:               - One diminutive polyp in the ascending colon,                            removed  with a cold snare. Resected and retrieved.                           - Internal hemorrhoids.                           - The examination was otherwise normal on direct                            and retroflexion views. Moderate Sedation:      MAC sedation used Recommendation:           - Patient has a contact number available for                            emergencies. The signs and symptoms of potential                            delayed complications were discussed with the                            patient. Return to normal activities tomorrow.                            Written discharge instructions were provided to the                            patient.                           -  Resume previous diet.                           - Continue present medications.                           - Await pathology results.                           - Repeat colonoscopy is recommended for                            surveillance. The colonoscopy date will be                            determined after pathology results from today's                            exam become available for review.                           - Resume Eliquis (apixaban) at prior dose today.                            (this evening) Procedure Code(s):        --- Professional ---                           [phone], Colonoscopy, flexible; with removal of                            tumor(s), polyp(s), or other lesion(s) by snare                            technique Diagnosis Code(s):        --- Professional ---                           Z86.010, Personal history of colonic polyps                           D12.2, Benign neoplasm of ascending colon                           K64.8, Other hemorrhoids CPT copyright 2022 American Medical Association. All rights reserved. The codes documented in this report are preliminary and upon coder review may  be revised to meet current compliance requirements. Elizebath Wever L. Loletha Carrow, MD 04/13/2022  11:57:31 AM This report has been signed electronically. Number of Addenda: 0

## 2022-04-14 LAB — SURGICAL PATHOLOGY

## 2022-04-15 ENCOUNTER — Other Ambulatory Visit: Payer: Self-pay

## 2022-04-15 ENCOUNTER — Encounter (HOSPITAL_COMMUNITY): Payer: Self-pay | Admitting: Gastroenterology

## 2022-04-16 ENCOUNTER — Other Ambulatory Visit: Payer: Self-pay

## 2022-04-16 ENCOUNTER — Encounter: Payer: Self-pay | Admitting: Gastroenterology

## 2022-04-28 ENCOUNTER — Other Ambulatory Visit: Payer: Self-pay

## 2022-05-03 ENCOUNTER — Other Ambulatory Visit: Payer: Self-pay

## 2022-05-03 DIAGNOSIS — G4733 Obstructive sleep apnea (adult) (pediatric): Secondary | ICD-10-CM | POA: Diagnosis not present

## 2022-06-02 ENCOUNTER — Other Ambulatory Visit: Payer: Self-pay

## 2022-06-02 DIAGNOSIS — G4733 Obstructive sleep apnea (adult) (pediatric): Secondary | ICD-10-CM | POA: Diagnosis not present

## 2022-06-14 ENCOUNTER — Other Ambulatory Visit: Payer: Self-pay

## 2022-06-14 ENCOUNTER — Other Ambulatory Visit: Payer: Self-pay | Admitting: Internal Medicine

## 2022-06-14 DIAGNOSIS — M1A09X0 Idiopathic chronic gout, multiple sites, without tophus (tophi): Secondary | ICD-10-CM

## 2022-06-14 MED ORDER — COLCHICINE 0.6 MG PO TABS
0.6000 mg | ORAL_TABLET | Freq: Every day | ORAL | 0 refills | Status: DC
Start: 2022-06-14 — End: 2022-09-08
  Filled 2022-06-14: qty 90, 90d supply, fill #0

## 2022-06-25 ENCOUNTER — Other Ambulatory Visit: Payer: Self-pay

## 2022-06-29 ENCOUNTER — Other Ambulatory Visit: Payer: Self-pay

## 2022-06-29 ENCOUNTER — Other Ambulatory Visit: Payer: Self-pay | Admitting: Physician Assistant

## 2022-06-29 DIAGNOSIS — I480 Paroxysmal atrial fibrillation: Secondary | ICD-10-CM

## 2022-06-29 NOTE — Telephone Encounter (Signed)
Patient of Dr. Ross. Please review for refill. Thank you!  

## 2022-06-30 ENCOUNTER — Other Ambulatory Visit: Payer: Self-pay

## 2022-06-30 MED ORDER — FLECAINIDE ACETATE 50 MG PO TABS
50.0000 mg | ORAL_TABLET | Freq: Every day | ORAL | 0 refills | Status: DC
Start: 2022-06-30 — End: 2022-07-26
  Filled 2022-06-30: qty 30, 30d supply, fill #0

## 2022-07-03 DIAGNOSIS — G4733 Obstructive sleep apnea (adult) (pediatric): Secondary | ICD-10-CM | POA: Diagnosis not present

## 2022-07-16 ENCOUNTER — Other Ambulatory Visit (HOSPITAL_COMMUNITY): Payer: Self-pay | Admitting: Urology

## 2022-07-16 ENCOUNTER — Ambulatory Visit (HOSPITAL_COMMUNITY)
Admission: RE | Admit: 2022-07-16 | Discharge: 2022-07-16 | Disposition: A | Discharge status: Home / Self Care | Payer: Medicare HMO | Source: Ambulatory Visit | Attending: Urology | Admitting: Urology

## 2022-07-16 DIAGNOSIS — Z85528 Personal history of other malignant neoplasm of kidney: Secondary | ICD-10-CM | POA: Diagnosis not present

## 2022-07-16 DIAGNOSIS — C641 Malignant neoplasm of right kidney, except renal pelvis: Secondary | ICD-10-CM

## 2022-07-19 ENCOUNTER — Other Ambulatory Visit: Payer: Self-pay

## 2022-07-19 DIAGNOSIS — K429 Umbilical hernia without obstruction or gangrene: Secondary | ICD-10-CM | POA: Diagnosis not present

## 2022-07-19 DIAGNOSIS — N281 Cyst of kidney, acquired: Secondary | ICD-10-CM | POA: Diagnosis not present

## 2022-07-19 DIAGNOSIS — C641 Malignant neoplasm of right kidney, except renal pelvis: Secondary | ICD-10-CM | POA: Diagnosis not present

## 2022-07-19 DIAGNOSIS — N400 Enlarged prostate without lower urinary tract symptoms: Secondary | ICD-10-CM | POA: Diagnosis not present

## 2022-07-26 ENCOUNTER — Other Ambulatory Visit: Payer: Self-pay

## 2022-07-26 ENCOUNTER — Other Ambulatory Visit: Payer: Self-pay | Admitting: Internal Medicine

## 2022-07-26 DIAGNOSIS — I480 Paroxysmal atrial fibrillation: Secondary | ICD-10-CM

## 2022-07-26 MED ORDER — FLECAINIDE ACETATE 50 MG PO TABS
50.0000 mg | ORAL_TABLET | Freq: Every day | ORAL | 0 refills | Status: DC
Start: 2022-07-26 — End: 2022-08-09
  Filled 2022-07-26: qty 15, 15d supply, fill #0

## 2022-07-27 ENCOUNTER — Other Ambulatory Visit: Payer: Self-pay

## 2022-08-02 DIAGNOSIS — G4733 Obstructive sleep apnea (adult) (pediatric): Secondary | ICD-10-CM | POA: Diagnosis not present

## 2022-08-09 ENCOUNTER — Other Ambulatory Visit: Payer: Self-pay | Admitting: Internal Medicine

## 2022-08-09 ENCOUNTER — Other Ambulatory Visit: Payer: Self-pay

## 2022-08-09 DIAGNOSIS — N401 Enlarged prostate with lower urinary tract symptoms: Secondary | ICD-10-CM

## 2022-08-09 DIAGNOSIS — I480 Paroxysmal atrial fibrillation: Secondary | ICD-10-CM

## 2022-08-09 MED ORDER — FLECAINIDE ACETATE 50 MG PO TABS
50.0000 mg | ORAL_TABLET | Freq: Every day | ORAL | 0 refills | Status: DC
Start: 2022-08-09 — End: 2022-08-31
  Filled 2022-08-09: qty 15, 15d supply, fill #0

## 2022-08-10 ENCOUNTER — Ambulatory Visit: Payer: Medicare HMO | Attending: Internal Medicine

## 2022-08-10 ENCOUNTER — Other Ambulatory Visit: Payer: Self-pay

## 2022-08-10 VITALS — Ht 75.0 in | Wt 242.0 lb

## 2022-08-10 DIAGNOSIS — Z0000 Encounter for general adult medical examination without abnormal findings: Secondary | ICD-10-CM

## 2022-08-10 MED ORDER — TAMSULOSIN HCL 0.4 MG PO CAPS
0.8000 mg | ORAL_CAPSULE | Freq: Every day | ORAL | 0 refills | Status: DC
Start: 2022-08-10 — End: 2022-11-04
  Filled 2022-08-10: qty 180, 90d supply, fill #0

## 2022-08-10 NOTE — Telephone Encounter (Signed)
Requested Prescriptions  Pending Prescriptions Disp Refills   tamsulosin (FLOMAX) 0.4 MG CAPS capsule 180 capsule 0    Sig: Take 2 capsules (0.8 mg total) by mouth at bedtime.     Urology: Alpha-Adrenergic Blocker Failed - 08/09/2022  5:40 AM      Failed - PSA in normal range and within 360 days    No results found for: "LABPSA", "PSA", "PSA1", "ULTRAPSA"       Failed - Last BP in normal range    BP Readings from Last 1 Encounters:  04/13/22 (!) 141/88         Passed - Valid encounter within last 12 months    Recent Outpatient Visits           6 months ago Essential hypertension   [place] [facility] Marcine Matar, MD   7 months ago Essential hypertension   [facility] [place], Cornelius Moras, RPH-CPP   8 months ago Essential hypertension   [place] [facility] Marcine Matar, MD   1 year ago Essential hypertension   [place] [facility] Marcine Matar, MD   1 year ago Essential hypertension   [place] [facility] Marcine Matar, MD

## 2022-08-10 NOTE — Progress Notes (Signed)
Subjective:   Albert Good. is a 67 y.o. male who presents for Medicare Annual/Subsequent preventive examination.  Visit Complete: Virtual  I connected with  Albert Good. on 08/10/22 by a audio enabled telemedicine application and verified that I am speaking with the correct person using two identifiers.  Patient Location: Home  Provider Location: Home Office  I discussed the limitations of evaluation and management by telemedicine. The patient expressed understanding and agreed to proceed.  Vital Signs: Per patient no change in vitals since last visit.  Review of Systems     Cardiac Risk Factors include: advanced age (>53men, >4 women);hypertension;male gender;smoking/ tobacco exposure;dyslipidemia     Objective:    Today's Vitals   08/10/22 1125  Weight: 242 lb (109.8 kg)  Height: 6\' 3"  (1.905 m)   Body mass index is 30.25 kg/m.     04/13/2022   10:10 AM 01/01/2022   11:06 AM 12/22/2020    5:46 AM 11/27/2020    8:16 AM 08/04/2020    8:51 AM 01/19/2020    8:18 PM  Advanced Directives  Does Patient Have a Medical Advance Directive? No No No No No No  Would patient like information on creating a medical advance directive? No - Patient declined Yes (ED - Information included in AVS) No - Patient declined No - Patient declined No - Patient declined No - Patient declined    Current Medications (verified) Outpatient Encounter Medications as of 08/10/2022  Medication Sig   allopurinol (ZYLOPRIM) 300 MG tablet TAKE 1 TABLET (300 MG TOTAL) BY MOUTH DAILY. TO TREAT GOUT   apixaban (ELIQUIS) 5 MG TABS tablet Take 1 tablet (5 mg total) by mouth 2 (two) times daily.   atorvastatin (LIPITOR) 10 MG tablet Take 1 tablet (10 mg total) by mouth once daily. (Patient taking differently: Take 10 mg by mouth every evening.)   colchicine 0.6 MG tablet Take 1 tablet (0.6 mg total) by mouth daily.   diltiazem (CARDIZEM CD) 300 MG 24 hr capsule Take 1 capsule (300 mg total) by  mouth daily.   flecainide (TAMBOCOR) 50 MG tablet Take 1 tablet (50 mg total) by mouth daily. Please call [phone] to schedule an overdue appointment for future refills. Thank you. Final attempt.   hydrALAZINE (APRESOLINE) 10 MG tablet Take 1 tablet (10 mg total) by mouth 2 (two) times daily.   metoprolol succinate (TOPROL-XL) 100 MG 24 hr tablet Take 1 tablet (100 mg total) by mouth daily. Take with or immediately following a meal.   tamsulosin (FLOMAX) 0.4 MG CAPS capsule Take 2 capsules (0.8 mg total) by mouth at bedtime.   triamcinolone cream (KENALOG) 0.1 % Apply 1 Application topically 2 (two) times daily. (Patient taking differently: Apply 1 Application topically 2 (two) times daily as needed (rash/skin irritation.).)   [DISCONTINUED] hydrALAZINE (APRESOLINE) 10 MG tablet Take 1 tablet (10 mg total) by mouth 2 (two) times daily. (Patient not taking: Reported on 04/07/2022)   [DISCONTINUED] Na Sulfate-K Sulfate-Mg Sulf 17.5-3.13-1.6 GM/177ML SOLN Use as directed   No facility-administered encounter medications on file as of 08/10/2022.    Allergies (verified) Other   History: Past Medical History:  Diagnosis Date   BPH associated with nocturia 03/12/2018   BPH with obstruction/lower urinary tract symptoms    Chronic gout of multiple sites 03/12/2018   Dysrhythmia    Afib   Essential hypertension    Gout    Long-term use of aspirin therapy 03/12/2018   OSA (obstructive sleep apnea)  uses Cpap   Paroxysmal atrial fibrillation [facility])    Past Surgical History:  Procedure Laterality Date   COLONOSCOPY WITH PROPOFOL N/A 04/13/2022   Procedure: COLONOSCOPY WITH PROPOFOL;  Surgeon: Sherrilyn Rist, MD;  Location: [facility] END[facility]COPY;  Service: Gastroenterology;  Laterality: N/A;   LIPOSUCTION     POLYPECTOMY  04/13/2022   Procedure: POLYPECTOMY;  Surgeon: Sherrilyn Rist, MD;  Location: [facility];  Service: Gastroenterology;;   ROBOTIC ASSITED PARTIAL NEPHRECTOMY Right 12/22/2020    Procedure: XI ROBOTIC ASSITED  NEPHRECTOMY;  Surgeon: Heloise Purpura, MD;  Location: [facility];  Service: Urology;  Laterality: Right;   Family History  Problem Relation Age of Onset   Diabetes Mother    Hypertension Father    Diabetes Sister    Diabetes Brother    Heart failure Brother    Kidney failure Brother    Colon cancer Neg Hx    Stomach cancer Neg Hx    Esophageal cancer Neg Hx    Social History   Socioeconomic History   Marital status: Single    Spouse name: Not on file   Number of children: 10   Years of education: Not on file   Highest education level: Not on file  Occupational History   Occupation: flagger  Tobacco Use   Smoking status: Former    Types: Cigarettes   Smokeless tobacco: Former    Types: Snuff    Quit date: 11/13/2020  Vaping Use   Vaping status: Never Used  Substance and Sexual Activity   Alcohol use: Yes    Comment: 2 every other day   Drug use: Not Currently   Sexual activity: Yes    Birth control/protection: None  Other Topics Concern   Not on file  Social History Narrative   Not on file   Social Determinants of Health   Financial Resource Strain: Low Risk  (08/10/2022)   Overall Financial Resource Strain (CARDIA)    Difficulty of Paying Living Expenses: Not hard at all  Food Insecurity: No Food Insecurity (08/10/2022)   Hunger Vital Sign    Worried About Running Out of Food in the Last Year: Never true    Ran Out of Food in the Last Year: Never true  Transportation Needs: No Transportation Needs (08/10/2022)   PRAPARE - Administrator, Civil Service (Medical): No    Lack of Transportation (Non-Medical): No  Physical Activity: Insufficiently Active (08/10/2022)   Exercise Vital Sign    Days of Exercise per Week: 3 days    Minutes of Exercise per Session: 30 min  Stress: No Stress Concern Present (08/10/2022)   [employer] of Occupational Health - Occupational Stress Questionnaire    Feeling of Stress : Not at all   Social Connections: Socially Isolated (08/10/2022)   Social Connection and Isolation Panel [NHANES]    Frequency of Communication with Friends and Family: More than three times a week    Frequency of Social Gatherings with Friends and Family: Three times a week    Attends Religious Services: Never    Active Member of Clubs or Organizations: No    Attends Banker Meetings: Never    Marital Status: Never married    Tobacco Counseling Counseling given: Not Answered   Clinical Intake:  Pre-visit preparation completed: Yes  Pain : No/denies pain     Diabetes: No  How often do you need to have someone help you when you read instructions, pamphlets, or other written  materials from your doctor or pharmacy?: 1 - Never  Interpreter Needed?: No  Information entered by :: Kandis Fantasia LPN   Activities of Daily Living    08/10/2022   11:26 AM 01/01/2022   11:07 AM  In your present state of health, do you have any difficulty performing the following activities:  Hearing? 0 0  Vision? 0 0  Difficulty concentrating or making decisions? 0 0  Walking or climbing stairs? 0 0  Dressing or bathing? 0 0  Doing errands, shopping? 0 0  Preparing Food and eating ? N N  Using the Toilet? N N  In the past six months, have you accidently leaked urine? N N  Do you have problems with loss of bowel control? N N  Managing your Medications? N N  Managing your Finances? N N  Housekeeping or managing your Housekeeping? N N    Patient Care Team: Marcine Matar, MD as PCP - General (Internal Medicine) Pricilla Riffle, MD as PCP - Cardiology (Cardiology) Quintella Reichert, MD as PCP - Sleep Medicine (Cardiology)  Indicate any recent Medical Services you may have received from other than [facility] providers in the past year (date may be approximate).     Assessment:   This is a routine wellness examination for [place].  Hearing/Vision screen No results found.  Dietary issues and  exercise activities discussed:     Goals Addressed             This Visit's Progress    Remain active and independent         Depression Screen    08/10/2022   11:29 AM 01/29/2022    2:32 PM 01/29/2022    2:31 PM 11/30/2021   10:22 AM 07/27/2021    1:41 PM 11/27/2020   10:08 AM 08/04/2020    8:44 AM  PHQ 2/9 Scores  PHQ - 2 Score 0 0 0 0 0 0 0  PHQ- 9 Score  0 3 3       Fall Risk    01/01/2022   11:06 AM 11/30/2021   10:17 AM 07/27/2021    1:41 PM 11/27/2020   10:08 AM 08/04/2020    8:52 AM  Fall Risk   Falls in the past year? 0 0 0 0 0  Number falls in past yr: 0 0 0 0 0  Injury with Fall? 0 0 0 0 0  Risk for fall due to : No Fall Risks No Fall Risks No Fall Risks No Fall Risks No Fall Risks  Follow up     Education provided    MEDICARE RISK AT HOME:   TIMED UP AND GO:  Was the test performed?  No    Cognitive Function:    01/01/2022   11:08 AM  MMSE - Mini Mental State Exam  Orientation to time 5  Orientation to Place 5  Registration 3  Attention/ Calculation 5  Recall 3  Language- name 2 objects 2  Language- repeat 1  Language- follow 3 step command 3  Language- read & follow direction 1  Write a sentence 1  Copy design 1  Total score 30        01/01/2022   11:10 AM  6CIT Screen  What Year? 0 points  What month? 0 points  What time? 0 points  Count back from 20 0 points  Months in reverse 0 points  Repeat phrase 0 points  Total Score 0 points    Immunizations Immunization  History  Administered Date(s) Administered   Fluad Quad(high Dose 65+) 11/30/2021   Influenza,inj,Quad PF,6+ Mos 09/28/2018, 01/24/2020   PFIZER(Purple Top)SARS-COV-2 Vaccination 03/23/2019, 04/23/2019   PNEUMOCOCCAL CONJUGATE-20 01/05/2022   Pneumococcal Conjugate-13 01/24/2020   Tdap 05/23/2020    TDAP status: Up to date  Pneumococcal vaccine status: Up to date  Covid-19 vaccine status: Information provided on how to obtain vaccines.   Qualifies for  Shingles Vaccine? Yes   Zostavax completed No   Shingrix Completed?: No.    Education has been provided regarding the importance of this vaccine. Patient has been advised to call insurance company to determine out of pocket expense if they have not yet received this vaccine. Advised may also receive vaccine at local pharmacy or Health Dept. Verbalized acceptance and understanding.  Screening Tests Health Maintenance  Topic Date Due   Hepatitis C Screening  Never done   Zoster Vaccines- Shingrix (1 of 2) Never done   COVID-19 Vaccine (3 - Pfizer risk series) 05/21/2019   INFLUENZA VACCINE  08/12/2022   Medicare Annual Wellness (AWV)  08/10/2023   Colonoscopy  04/13/2027   DTaP/Tdap/Td (2 - Td or Tdap) 05/24/2030   Pneumonia Vaccine 67+ Years old  Completed   HPV VACCINES  Aged Out    Health Maintenance  Health Maintenance Due  Topic Date Due   Hepatitis C Screening  Never done   Zoster Vaccines- Shingrix (1 of 2) Never done   COVID-19 Vaccine (3 - Pfizer risk series) 05/21/2019    Colorectal cancer screening: Type of screening: Colonoscopy. Completed 04/13/22. Repeat every 5 years  Lung Cancer Screening: (Low Dose CT Chest recommended if Age 51-80 years, 20 pack-year currently smoking OR have quit w/in 15years.) does not qualify.   Lung Cancer Screening Referral: n/a  Additional Screening:  Hepatitis C Screening: does qualify  Vision Screening: Recommended annual ophthalmology exams for early detection of glaucoma and other disorders of the eye. Is the patient up to date with their annual eye exam?  Yes  Who is the provider or what is the name of the office in which the patient attends annual eye exams? none If pt is not established with a provider, would they like to be referred to a provider to establish care? No .   Dental Screening: Recommended annual dental exams for proper oral hygiene  Community Resource Referral / Chronic Care Management: CRR required this visit?  No    CCM required this visit?  No     Plan:     I have personally reviewed and noted the following in the patient's chart:   Medical and social history Use of alcohol, tobacco or illicit drugs  Current medications and supplements including opioid prescriptions. Patient is not currently taking opioid prescriptions. Functional ability and status Nutritional status Physical activity Advanced directives List of other physicians Hospitalizations, surgeries, and ER visits in previous 12 months Vitals Screenings to include cognitive, depression, and falls Referrals and appointments  In addition, I have reviewed and discussed with patient certain preventive protocols, quality metrics, and best practice recommendations. A written personalized care plan for preventive services as well as general preventive health recommendations were provided to patient.     Kandis Fantasia [place], [place]   1/61/0960   After Visit Summary: (Mail) Due to this being a telephonic visit, the after visit summary with patients personalized plan was offered to patient via mail   Nurse Notes: No concerns at this time

## 2022-08-10 NOTE — Patient Instructions (Addendum)
Albert Good , Thank you for taking time to come for your Medicare Wellness Visit. I appreciate your ongoing commitment to your health goals. Please review the following plan we discussed and let me know if I can assist you in the future.   Referrals/Orders/Follow-Ups/Clinician Recommendations: Aim for 30 minutes of exercise or brisk walking, 6-8 glasses of water, and 5 servings of fruits and vegetables each day.   This is a list of the screening recommended for you and due dates:  Health Maintenance  Topic Date Due   Hepatitis C Screening  Never done   Zoster (Shingles) Vaccine (1 of 2) Never done   COVID-19 Vaccine (3 - Pfizer risk series) 05/21/2019   Flu Shot  08/12/2022   Medicare Annual Wellness Visit  08/10/2023   Colon Cancer Screening  04/13/2027   DTaP/Tdap/Td vaccine (2 - Td or Tdap) 05/24/2030   Pneumonia Vaccine  Completed   HPV Vaccine  Aged Out    Advanced directives: (ACP Link)Information on Advanced Care Planning can be found at [facility] [place] Advance Health Care Directives Advance Health Care Directives ([email or web address]   Next Medicare Annual Wellness Visit scheduled for next year: Yes  Preventive Care 65 Years and Older, Male  Preventive care refers to lifestyle choices and visits with your health care provider that can promote health and wellness. What does preventive care include? A yearly physical exam. This is also called an annual well check. Dental exams once or twice a year. Routine eye exams. Ask your health care provider how often you should have your eyes checked. Personal lifestyle choices, including: Daily care of your teeth and gums. Regular physical activity. Eating a healthy diet. Avoiding tobacco and drug use. Limiting alcohol use. Practicing safe sex. Taking low doses of aspirin every day. Taking vitamin and mineral supplements as recommended by your health care provider. What happens during an annual well check? The services  and screenings done by your health care provider during your annual well check will depend on your age, overall health, lifestyle risk factors, and family history of disease. Counseling  Your health care provider may ask you questions about your: Alcohol use. Tobacco use. Drug use. Emotional well-being. Home and relationship well-being. Sexual activity. Eating habits. History of falls. Memory and ability to understand (cognition). Work and work Astronomer. Screening  You may have the following tests or measurements: Height, weight, and BMI. Blood pressure. Lipid and cholesterol levels. These may be checked every 5 years, or more frequently if you are over 32 years old. Skin check. Lung cancer screening. You may have this screening every year starting at age 9 if you have a 30-pack-year history of smoking and currently smoke or have quit within the past 15 years. Fecal occult blood test (FOBT) of the stool. You may have this test every year starting at age 66. Flexible sigmoidoscopy or colonoscopy. You may have a sigmoidoscopy every 5 years or a colonoscopy every 10 years starting at age 41. Prostate cancer screening. Recommendations will vary depending on your family history and other risks. Hepatitis C blood test. Hepatitis B blood test. Sexually transmitted disease (STD) testing. Diabetes screening. This is done by checking your blood sugar (glucose) after you have not eaten for a while (fasting). You may have this done every 1-3 years. Abdominal aortic aneurysm (AAA) screening. You may need this if you are a current or former smoker. Osteoporosis. You may be screened starting at age 58 if you are at high risk. Talk  with your health care provider about your test results, treatment options, and if necessary, the need for more tests. Vaccines  Your health care provider may recommend certain vaccines, such as: Influenza vaccine. This is recommended every year. Tetanus, diphtheria,  and acellular pertussis (Tdap, Td) vaccine. You may need a Td booster every 10 years. Zoster vaccine. You may need this after age 57. Pneumococcal 13-valent conjugate (PCV13) vaccine. One dose is recommended after age 56. Pneumococcal polysaccharide (PPSV23) vaccine. One dose is recommended after age 35. Talk to your health care provider about which screenings and vaccines you need and how often you need them. This information is not intended to replace advice given to you by your health care provider. Make sure you discuss any questions you have with your health care provider. Document Released: 01/24/2015 Document Revised: 09/17/2015 Document Reviewed: 10/29/2014 Elsevier Interactive Patient Education  2017 [employer].  Fall Prevention in the Home Falls can cause injuries. They can happen to people of all ages. There are many things you can do to make your home safe and to help prevent falls. What can I do on the outside of my home? Regularly fix the edges of walkways and driveways and fix any cracks. Remove anything that might make you trip as you walk through a door, such as a raised step or threshold. Trim any bushes or trees on the path to your home. Use bright outdoor lighting. Clear any walking paths of anything that might make someone trip, such as rocks or tools. Regularly check to see if handrails are loose or broken. Make sure that both sides of any steps have handrails. Any raised decks and porches should have guardrails on the edges. Have any leaves, snow, or ice cleared regularly. Use sand or salt on walking paths during winter. Clean up any spills in your garage right away. This includes oil or grease spills. What can I do in the bathroom? Use night lights. Install grab bars by the toilet and in the tub and shower. Do not use towel bars as grab bars. Use non-skid mats or decals in the tub or shower. If you need to sit down in the shower, use a plastic, non-slip  stool. Keep the floor dry. Clean up any water that spills on the floor as soon as it happens. Remove soap buildup in the tub or shower regularly. Attach bath mats securely with double-sided non-slip rug tape. Do not have throw rugs and other things on the floor that can make you trip. What can I do in the bedroom? Use night lights. Make sure that you have a light by your bed that is easy to reach. Do not use any sheets or blankets that are too big for your bed. They should not hang down onto the floor. Have a firm chair that has side arms. You can use this for support while you get dressed. Do not have throw rugs and other things on the floor that can make you trip. What can I do in the kitchen? Clean up any spills right away. Avoid walking on wet floors. Keep items that you use a lot in easy-to-reach places. If you need to reach something above you, use a strong step stool that has a grab bar. Keep electrical cords out of the way. Do not use floor polish or wax that makes floors slippery. If you must use wax, use non-skid floor wax. Do not have throw rugs and other things on the floor that can make you  trip. What can I do with my stairs? Do not leave any items on the stairs. Make sure that there are handrails on both sides of the stairs and use them. Fix handrails that are broken or loose. Make sure that handrails are as long as the stairways. Check any carpeting to make sure that it is firmly attached to the stairs. Fix any carpet that is loose or worn. Avoid having throw rugs at the top or bottom of the stairs. If you do have throw rugs, attach them to the floor with carpet tape. Make sure that you have a light switch at the top of the stairs and the bottom of the stairs. If you do not have them, ask someone to add them for you. What else can I do to help prevent falls? Wear shoes that: Do not have high heels. Have rubber bottoms. Are comfortable and fit you well. Are closed at the  toe. Do not wear sandals. If you use a stepladder: Make sure that it is fully opened. Do not climb a closed stepladder. Make sure that both sides of the stepladder are locked into place. Ask someone to hold it for you, if possible. Clearly mark and make sure that you can see: Any grab bars or handrails. First and last steps. Where the edge of each step is. Use tools that help you move around (mobility aids) if they are needed. These include: Canes. Walkers. Scooters. Crutches. Turn on the lights when you go into a dark area. Replace any light bulbs as soon as they burn out. Set up your furniture so you have a clear path. Avoid moving your furniture around. If any of your floors are uneven, fix them. If there are any pets around you, be aware of where they are. Review your medicines with your doctor. Some medicines can make you feel dizzy. This can increase your chance of falling. Ask your doctor what other things that you can do to help prevent falls. This information is not intended to replace advice given to you by your health care provider. Make sure you discuss any questions you have with your health care provider. Document Released: 10/24/2008 Document Revised: 06/05/2015 Document Reviewed: 02/01/2014 Elsevier Interactive Patient Education  2017 [employer].

## 2022-08-11 ENCOUNTER — Other Ambulatory Visit: Payer: Self-pay

## 2022-08-24 ENCOUNTER — Other Ambulatory Visit: Payer: Self-pay | Admitting: Internal Medicine

## 2022-08-24 ENCOUNTER — Other Ambulatory Visit: Payer: Self-pay

## 2022-08-24 DIAGNOSIS — I480 Paroxysmal atrial fibrillation: Secondary | ICD-10-CM

## 2022-08-25 ENCOUNTER — Other Ambulatory Visit: Payer: Self-pay

## 2022-08-26 ENCOUNTER — Other Ambulatory Visit: Payer: Self-pay

## 2022-08-31 ENCOUNTER — Other Ambulatory Visit: Payer: Self-pay | Admitting: Internal Medicine

## 2022-08-31 DIAGNOSIS — I480 Paroxysmal atrial fibrillation: Secondary | ICD-10-CM

## 2022-09-01 ENCOUNTER — Other Ambulatory Visit: Payer: Self-pay

## 2022-09-01 MED ORDER — FLECAINIDE ACETATE 50 MG PO TABS
50.0000 mg | ORAL_TABLET | Freq: Every day | ORAL | 1 refills | Status: DC
Start: 2022-09-01 — End: 2022-10-25
  Filled 2022-09-01: qty 30, 30d supply, fill #0
  Filled 2022-09-24: qty 30, 30d supply, fill #1

## 2022-09-02 DIAGNOSIS — G4733 Obstructive sleep apnea (adult) (pediatric): Secondary | ICD-10-CM | POA: Diagnosis not present

## 2022-09-08 ENCOUNTER — Other Ambulatory Visit: Payer: Self-pay | Admitting: Internal Medicine

## 2022-09-08 ENCOUNTER — Other Ambulatory Visit: Payer: Self-pay

## 2022-09-08 DIAGNOSIS — M1A09X0 Idiopathic chronic gout, multiple sites, without tophus (tophi): Secondary | ICD-10-CM

## 2022-09-08 MED ORDER — COLCHICINE 0.6 MG PO TABS
0.6000 mg | ORAL_TABLET | Freq: Every day | ORAL | 0 refills | Status: DC
Start: 2022-09-08 — End: 2022-10-06
  Filled 2022-09-08: qty 30, 30d supply, fill #0

## 2022-09-20 ENCOUNTER — Encounter: Payer: Self-pay | Admitting: Cardiology

## 2022-09-20 ENCOUNTER — Ambulatory Visit: Payer: Medicare HMO | Attending: Cardiology | Admitting: Cardiology

## 2022-09-20 ENCOUNTER — Other Ambulatory Visit: Payer: Self-pay | Admitting: Internal Medicine

## 2022-09-20 VITALS — BP 138/80 | HR 66 | Ht 75.0 in | Wt 240.0 lb

## 2022-09-20 DIAGNOSIS — I10 Essential (primary) hypertension: Secondary | ICD-10-CM | POA: Diagnosis not present

## 2022-09-20 DIAGNOSIS — G4733 Obstructive sleep apnea (adult) (pediatric): Secondary | ICD-10-CM | POA: Diagnosis not present

## 2022-09-20 DIAGNOSIS — M1A09X0 Idiopathic chronic gout, multiple sites, without tophus (tophi): Secondary | ICD-10-CM

## 2022-09-20 NOTE — Progress Notes (Signed)
Cardiology Office Note:    Date:  09/20/2022   ID:  Albert Good., DOB 09-05-1955, MRN [id number]  PCP:  Marcine Matar, MD  Cardiologist:  Dietrich Pates, MD    Referring MD: Marcine Matar, MD   Chief Complaint  Patient presents with   Sleep Apnea   Hypertension   Atrial Fibrillation    History of Present Illness:    Albert Good. is a 67 y.o. male with a hx of PAF, HTN and OSA.  He was referred for sleep study due to excessive daytime sleepiness and snoring and was found to have severe OSA with an AHI of 31.8/hr and nocturnal hypoxemia with O2 sats as low as 72%.  He underwent CPAP titration to 12cm H2O.  He is now here for followup.   He is doing well with his PAP device and thinks that he has gotten used to it.  He tolerates the full mask and feels the pressure is adequate.  Since going on PAP he feels rested in the am and has no significant daytime sleepiness for the most part but depends on how much he gets up to go to the bathroom at night.  He denies any significant mouth or nasal dryness or nasal congestion.  He does not think that he snores.    Past Medical History:  Diagnosis Date   BPH associated with nocturia 03/12/2018   BPH with obstruction/lower urinary tract symptoms    Chronic gout of multiple sites 03/12/2018   Dysrhythmia    Afib   Essential hypertension    Gout    Long-term use of aspirin therapy 03/12/2018   OSA (obstructive sleep apnea)    uses Cpap   Paroxysmal atrial fibrillation [facility])     Past Surgical History:  Procedure Laterality Date   COLONOSCOPY WITH PROPOFOL N/A 04/13/2022   Procedure: COLONOSCOPY WITH PROPOFOL;  Surgeon: Sherrilyn Rist, MD;  Location: [facility];  Service: Gastroenterology;  Laterality: N/A;   LIPOSUCTION     POLYPECTOMY  04/13/2022   Procedure: POLYPECTOMY;  Surgeon: Sherrilyn Rist, MD;  Location: [facility];  Service: Gastroenterology;;   ROBOTIC ASSITED PARTIAL NEPHRECTOMY Right 12/22/2020    Procedure: XI ROBOTIC ASSITED  NEPHRECTOMY;  Surgeon: Heloise Purpura, MD;  Location: [facility];  Service: Urology;  Laterality: Right;    Current Medications: Current Meds  Medication Sig   allopurinol (ZYLOPRIM) 300 MG tablet TAKE 1 TABLET (300 MG TOTAL) BY MOUTH DAILY. TO TREAT GOUT   apixaban (ELIQUIS) 5 MG TABS tablet Take 1 tablet (5 mg total) by mouth 2 (two) times daily.   atorvastatin (LIPITOR) 10 MG tablet Take 1 tablet (10 mg total) by mouth once daily.   colchicine 0.6 MG tablet Take 1 tablet (0.6 mg total) by mouth daily. Please make PCP appointment.   diltiazem (CARDIZEM CD) 300 MG 24 hr capsule Take 1 capsule (300 mg total) by mouth daily.   flecainide (TAMBOCOR) 50 MG tablet Take 1 tablet (50 mg total) by mouth daily.Pt must keep upcoming appt in November 2024 with Cardiologist before anymore refills. Thank you Final Attempt   hydrALAZINE (APRESOLINE) 10 MG tablet Take 1 tablet (10 mg total) by mouth 2 (two) times daily.   metoprolol succinate (TOPROL-XL) 100 MG 24 hr tablet Take 1 tablet (100 mg total) by mouth daily. Take with or immediately following a meal.   tamsulosin (FLOMAX) 0.4 MG CAPS capsule Take 2 capsules (0.8 mg total) by mouth at bedtime.  triamcinolone cream (KENALOG) 0.1 % Apply 1 Application topically 2 (two) times daily.     Allergies:   Other   Social History   Socioeconomic History   Marital status: Single    Spouse name: Not on file   Number of children: 10   Years of education: Not on file   Highest education level: Not on file  Occupational History   Occupation: flagger  Tobacco Use   Smoking status: Former    Types: Cigarettes   Smokeless tobacco: Former    Types: Snuff    Quit date: 11/13/2020  Vaping Use   Vaping status: Never Used  Substance and Sexual Activity   Alcohol use: Yes    Comment: 2 every other day   Drug use: Not Currently   Sexual activity: Yes    Birth control/protection: None  Other Topics Concern   Not on file   Social History Narrative   Not on file   Social Determinants of Health   Financial Resource Strain: Low Risk  (08/10/2022)   Overall Financial Resource Strain (CARDIA)    Difficulty of Paying Living Expenses: Not hard at all  Food Insecurity: No Food Insecurity (08/10/2022)   Hunger Vital Sign    Worried About Running Out of Food in the Last Year: Never true    Ran Out of Food in the Last Year: Never true  Transportation Needs: No Transportation Needs (08/10/2022)   PRAPARE - Administrator, Civil Service (Medical): No    Lack of Transportation (Non-Medical): No  Physical Activity: Insufficiently Active (08/10/2022)   Exercise Vital Sign    Days of Exercise per Week: 3 days    Minutes of Exercise per Session: 30 min  Stress: No Stress Concern Present (08/10/2022)   [employer] of Occupational Health - Occupational Stress Questionnaire    Feeling of Stress : Not at all  Social Connections: Socially Isolated (08/10/2022)   Social Connection and Isolation Panel [NHANES]    Frequency of Communication with Friends and Family: More than three times a week    Frequency of Social Gatherings with Friends and Family: Three times a week    Attends Religious Services: Never    Active Member of Clubs or Organizations: No    Attends Banker Meetings: Never    Marital Status: Never married     Family History: The patient's family history includes Diabetes in his brother, mother, and sister; Heart failure in his brother; Hypertension in his father; Kidney failure in his brother. There is no history of Colon cancer, Stomach cancer, or Esophageal cancer.  ROS:   Please see the history of present illness.    ROS  All other systems reviewed and negative.   EKGs/Labs/Other Studies Reviewed:    The following studies were reviewed today: PSG, PAP titration and PAP download  Recent Labs: 12/03/2021: Hemoglobin 14.0; Platelets 208 01/29/2022: BUN 17; Creatinine, Ser  1.57; Potassium 4.6; Sodium 138   Recent Lipid Panel    Component Value Date/Time   CHOL 133 07/27/2021 1435   TRIG 67 07/27/2021 1435   HDL 69 07/27/2021 1435   CHOLHDL 1.9 07/27/2021 1435   LDLCALC 50 07/27/2021 1435    Physical Exam:    VS:  BP 138/80   Pulse 66   Ht 6\' 3"  (1.905 m)   Wt 240 lb (108.9 kg)   SpO2 93%   BMI 30.00 kg/m     Wt Readings from Last 3 Encounters:  09/20/22 240  lb (108.9 kg)  08/10/22 242 lb (109.8 kg)  04/13/22 242 lb 1 oz (109.8 kg)     GEN: Well nourished, well developed in no acute distress HEENT: Normal NECK: No JVD; No carotid bruits LYMPHATICS: No lymphadenopathy CARDIAC:RRR, no murmurs, rubs, gallops RESPIRATORY:  Clear to auscultation without rales, wheezing or rhonchi  ABDOMEN: Soft, non-tender, non-distended MUSCULOSKELETAL:  No edema; No deformity  SKIN: Warm and dry NEUROLOGIC:  Alert and oriented x 3 PSYCHIATRIC:  Normal affect  ASSESSMENT:    1. OSA (obstructive sleep apnea)   2. Essential hypertension     PLAN:    In order of problems listed above:  OSA - The patient is tolerating PAP therapy well without any problems.The patient has been using and benefiting from PAP use and will continue to benefit from therapy.  -his SD card is bad so I will get his DME to get him a new SD card and get a download  HTN -BP controlled on exam today -Continue prescription drug management with Cardizem CD 300 mg daily, hydralazine 10 mg twice daily and Toprol XL  at 100 mg daily with as needed refills    Time Spent: 20 minutes total time of encounter, including 10 minutes spent in face-to-face patient care on the date of this encounter. This time includes coordination of care and counseling regarding above mentioned problem list. Remainder of non-face-to-face time involved reviewing chart documents/testing relevant to the patient encounter and documentation in the medical record. I have independently reviewed documentation from  referring provider  Medication Adjustments/Labs and Tests Ordered: Current medicines are reviewed at length with the patient today.  Concerns regarding medicines are outlined above.  No orders of the defined types were placed in this encounter.  No orders of the defined types were placed in this encounter.   Signed, Armanda Magic, MD  09/20/2022 11:44 AM    [facility]

## 2022-09-20 NOTE — Patient Instructions (Addendum)
Medication Instructions:  Your physician recommends that you continue on your current medications as directed. Please refer to the Current Medication list given to you today.  *If you need a refill on your cardiac medications before your next appointment, please call your pharmacy*   Lab Work: None.   If you have labs (blood work) drawn today and your tests are completely normal, you will receive your results only by: MyChart Message (if you have MyChart) OR A paper copy in the mail If you have any lab test that is abnormal or we need to change your treatment, we will call you to review the results.   Testing/Procedures: None.   Follow-Up: At [facility], you and your health needs are our priority.  As part of our continuing mission to provide you with exceptional heart care, we have created designated Provider Care Teams.  These Care Teams include your primary Cardiologist (physician) and Advanced Practice Providers (APPs -  Physician Assistants and Nurse Practitioners) who all work together to provide you with the care you need, when you need it.  We recommend signing up for the patient portal called "MyChart".  Sign up information is provided on this After Visit Summary.  MyChart is used to connect with patients for Virtual Visits (Telemedicine).  Patients are able to view lab/test results, encounter notes, upcoming appointments, etc.  Non-urgent messages can be sent to your provider as well.   To learn more about what you can do with MyChart, go to [email or web address].    Your next appointment:   1 year(s)  Provider:   Dr. Armanda Magic, MD   Other Instructions You may need a new SD card, please call your DME company to follow up on replacing this component. Then, call us and let  us know when your machine has been updated.

## 2022-09-21 ENCOUNTER — Other Ambulatory Visit: Payer: Self-pay

## 2022-10-01 ENCOUNTER — Other Ambulatory Visit: Payer: Self-pay | Admitting: Internal Medicine

## 2022-10-01 ENCOUNTER — Other Ambulatory Visit: Payer: Self-pay

## 2022-10-01 DIAGNOSIS — M1A09X0 Idiopathic chronic gout, multiple sites, without tophus (tophi): Secondary | ICD-10-CM

## 2022-10-01 NOTE — Telephone Encounter (Signed)
Medication Refill - Medication: allopurinol (ZYLOPRIM) 300 MG tablet   Has the patient contacted their pharmacy? Yes.   Pt told to contact provider  Preferred Pharmacy (with phone number or street name):  [facility] Phone: [phone]  Fax: [phone]     Has the patient been seen for an appointment in the last year OR does the patient have an upcoming appointment? Yes.    Agent: Please be advised that RX refills may take up to 3 business days. We ask that you follow-up with your pharmacy.

## 2022-10-03 DIAGNOSIS — G4733 Obstructive sleep apnea (adult) (pediatric): Secondary | ICD-10-CM | POA: Diagnosis not present

## 2022-10-04 ENCOUNTER — Other Ambulatory Visit: Payer: Self-pay

## 2022-10-04 MED ORDER — ALLOPURINOL 300 MG PO TABS
300.0000 mg | ORAL_TABLET | Freq: Every day | ORAL | 1 refills | Status: DC
Start: 2022-10-04 — End: 2022-11-25
  Filled 2022-10-04: qty 30, 30d supply, fill #0
  Filled 2022-11-04: qty 30, 30d supply, fill #1

## 2022-10-04 NOTE — Telephone Encounter (Signed)
Requested medications are due for refill today.  yes  Requested medications are on the active medications list.  yes  Last refill. 03/10/2022 #30 6 rf  Future visit scheduled.   Yes   Notes to clinic.  Uric acid lab is expired.    Requested Prescriptions  Pending Prescriptions Disp Refills   allopurinol (ZYLOPRIM) 300 MG tablet 30 tablet 6    Sig: TAKE 1 TABLET (300 MG TOTAL) BY MOUTH DAILY. TO TREAT GOUT     Endocrinology:  Gout Agents - allopurinol Failed - 10/01/2022 12:18 PM      Failed - Uric Acid in normal range and within 360 days    Uric Acid  Date Value Ref Range Status  09/28/2018 3.4 (L) 3.7 - 8.6 mg/dL Final    Comment:               Therapeutic target for gout patients: <6.0         Failed - Cr in normal range and within 360 days    Creatinine, Ser  Date Value Ref Range Status  01/29/2022 1.57 (H) 0.76 - 1.27 mg/dL Final         Failed - CBC within normal limits and completed in the last 12 months    WBC  Date Value Ref Range Status  12/03/2021 5.2 4.0 - 10.5 K/uL Final   RBC  Date Value Ref Range Status  12/03/2021 4.91 4.22 - 5.81 MIL/uL Final   Hemoglobin  Date Value Ref Range Status  12/03/2021 14.0 13.0 - 17.0 g/dL Final  66/44/0347 42.5 13.0 - 17.7 g/dL Final   HCT  Date Value Ref Range Status  12/03/2021 43.1 39.0 - 52.0 % Final   Hematocrit  Date Value Ref Range Status  07/27/2021 41.4 37.5 - 51.0 % Final   MCHC  Date Value Ref Range Status  12/03/2021 32.5 30.0 - 36.0 g/dL Final   [facility]  Date Value Ref Range Status  12/03/2021 28.5 26.0 - 34.0 pg Final   MCV  Date Value Ref Range Status  12/03/2021 87.8 80.0 - 100.0 fL Final  07/27/2021 86 79 - 97 fL Final   No results found for: "PLTCOUNTKUC", "LABPLAT", "POCPLA" RDW  Date Value Ref Range Status  12/03/2021 14.9 11.5 - 15.5 % Final  07/27/2021 14.4 11.6 - 15.4 % Final         Passed - Valid encounter within last 12 months    Recent Outpatient Visits           8 months  ago Essential hypertension   [facility] Marcine Matar, MD   9 months ago Essential hypertension   [facility] [place], Cornelius Moras, RPH-CPP   10 months ago Essential hypertension   [facility] Marcine Matar, MD   1 year ago Essential hypertension   [facility] Marcine Matar, MD   1 year ago Essential hypertension   [place] [facility] Marcine Matar, MD       Future Appointments             In 1 month Sharlene Dory, PA-C [facility] [employer]   In 1 month Laural Benes, Binnie Rail, MD [facility]

## 2022-10-06 ENCOUNTER — Other Ambulatory Visit: Payer: Self-pay

## 2022-10-06 ENCOUNTER — Other Ambulatory Visit: Payer: Self-pay | Admitting: Internal Medicine

## 2022-10-06 DIAGNOSIS — M1A09X0 Idiopathic chronic gout, multiple sites, without tophus (tophi): Secondary | ICD-10-CM

## 2022-10-06 MED ORDER — COLCHICINE 0.6 MG PO TABS
0.6000 mg | ORAL_TABLET | Freq: Every day | ORAL | 0 refills | Status: DC
Start: 2022-10-06 — End: 2022-11-08
  Filled 2022-10-06: qty 30, 30d supply, fill #0

## 2022-10-14 ENCOUNTER — Other Ambulatory Visit: Payer: Self-pay

## 2022-10-22 ENCOUNTER — Other Ambulatory Visit: Payer: Self-pay

## 2022-10-25 ENCOUNTER — Other Ambulatory Visit: Payer: Self-pay | Admitting: Internal Medicine

## 2022-10-25 ENCOUNTER — Other Ambulatory Visit: Payer: Self-pay

## 2022-10-25 DIAGNOSIS — I480 Paroxysmal atrial fibrillation: Secondary | ICD-10-CM

## 2022-10-27 ENCOUNTER — Other Ambulatory Visit: Payer: Self-pay

## 2022-10-27 MED ORDER — FLECAINIDE ACETATE 50 MG PO TABS
50.0000 mg | ORAL_TABLET | Freq: Every day | ORAL | 0 refills | Status: DC
Start: 2022-10-27 — End: 2022-11-19
  Filled 2022-10-27: qty 30, 30d supply, fill #0

## 2022-10-28 ENCOUNTER — Other Ambulatory Visit: Payer: Self-pay

## 2022-10-31 DIAGNOSIS — G4733 Obstructive sleep apnea (adult) (pediatric): Secondary | ICD-10-CM | POA: Diagnosis not present

## 2022-11-02 ENCOUNTER — Other Ambulatory Visit: Payer: Self-pay

## 2022-11-04 ENCOUNTER — Other Ambulatory Visit: Payer: Self-pay | Admitting: Internal Medicine

## 2022-11-04 DIAGNOSIS — N401 Enlarged prostate with lower urinary tract symptoms: Secondary | ICD-10-CM

## 2022-11-05 ENCOUNTER — Other Ambulatory Visit: Payer: Self-pay

## 2022-11-05 MED ORDER — TAMSULOSIN HCL 0.4 MG PO CAPS
0.8000 mg | ORAL_CAPSULE | Freq: Every day | ORAL | 0 refills | Status: DC
Start: 2022-11-05 — End: 2022-11-25
  Filled 2022-11-05: qty 60, 30d supply, fill #0

## 2022-11-08 ENCOUNTER — Other Ambulatory Visit: Payer: Self-pay | Admitting: Internal Medicine

## 2022-11-08 ENCOUNTER — Other Ambulatory Visit: Payer: Self-pay

## 2022-11-08 DIAGNOSIS — M1A09X0 Idiopathic chronic gout, multiple sites, without tophus (tophi): Secondary | ICD-10-CM

## 2022-11-08 MED ORDER — COLCHICINE 0.6 MG PO TABS
0.6000 mg | ORAL_TABLET | Freq: Every day | ORAL | 0 refills | Status: DC
Start: 2022-11-08 — End: 2022-11-25
  Filled 2022-11-08: qty 30, 30d supply, fill #0

## 2022-11-18 NOTE — Progress Notes (Signed)
Office Visit    Patient Name: Albert Good. Date of Encounter: 11/19/2022  PCP:  Marcine Matar, MD   [facility]  Cardiologist:  Dietrich Pates, MD  Advanced Practice Provider:  No care team member to display Electrophysiologist:  None   Chief Complaint    Albert Good. is a 67 y.o. male with a hx of PAF, HTN and OSA on CPAP Presents today for follow-up appointment. 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   He was seen by Dr. Armanda Magic a year ago for evaluation of OSA.  He was doing well with his CPAP device at that time.  He tolerated a full mask and felt adequate pressure.  He has had no significant daytime sleepiness as of that appointment.  He was seen by me 06/29/2021, he states he has been doing pretty well.  He had his right kidney removed back in December for cystic clear-cell carcinoma.  He was started on statin therapy at that time for hyperlipidemia.  He also has a history of atrial fibrillation and is anticoagulated with Eliquis.  He is sinus bradycardia with rate in the 50s.  However, he is asymptomatic.  His blood pressure is well controlled today.  He has not had any lower extremity edema.  He has been compliant with his CPAP for his sleep apnea.  He retired but then went back to work and works over 50 hours a week.  We discussed increasing cardiovascular exercise to daily and he is interested in returning to the gym.   He was seen by Dr. Mayford Knife a few months later (09/20/22)  to review PAP device and sleep apnea symptoms.  Today, he presents with a history of hypertension, atrial fibrillation, and sleep apnea, presents for a routine follow-up. He reports good adherence to his medication regimen, which includes hydralazine 10mg  twice daily, metoprolol 100mg  daily, Cardizem 300mg  daily, Eliquis 5mg  twice daily, and Lipitor 10mg  daily. He denies any recent chest pain or shortness of breath. He has been  using a CPAP device for sleep apnea, which he reports is going well.  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 He has not had any issues with atrial fibrillation for several years.  The patient has experienced significant family loss in recent years, with four of his seven siblings passing away, three from heart-related conditions and one from cancer. He is the oldest male sibling remaining.  The patient is due to see his primary care provider next week. He has not had any lab work done this year.  Reports no shortness of breath nor dyspnea on exertion. Reports no chest pain, pressure, or tightness. No edema, orthopnea, PND. Reports no palpitations.    Past Medical History    Past Medical History:  Diagnosis Date   BPH associated with nocturia 03/12/2018   BPH with obstruction/lower urinary tract symptoms    Chronic gout of multiple sites 03/12/2018   Dysrhythmia    Afib   Essential hypertension    Gout    Long-term use of aspirin therapy 03/12/2018   OSA (obstructive sleep apnea)    uses Cpap   Paroxysmal atrial fibrillation [facility])    Past Surgical History:  Procedure Laterality Date   COLONOSCOPY WITH PROPOFOL N/A 04/13/2022   Procedure: COLONOSCOPY WITH PROPOFOL;  Surgeon: Charlie Pitter III,  MD;  Location: [facility];  Service: Gastroenterology;  Laterality: N/A;   LIPOSUCTION     POLYPECTOMY  04/13/2022   Procedure: POLYPECTOMY;  Surgeon: Sherrilyn Rist, MD;  Location: [facility];  Service: Gastroenterology;;   ROBOTIC ASSITED PARTIAL NEPHRECTOMY Right 12/22/2020   Procedure: XI ROBOTIC ASSITED  NEPHRECTOMY;  Surgeon: Heloise Purpura, MD;  Location: [facility];  Service: Urology;  Laterality: Right;    Allergies  Allergies  Allergen Reactions   Other Hives    HAIR DYE  Lactose intolerant    EKGs/Labs/Other Studies Reviewed:   The following studies were reviewed  today:  Long-term monitor 4/22  Patch Wear Time:  14 days and 0 hours   SInus rhythm  Rates 45 to 154 bpm  Average HR 68 bpm  Short burst SVT (? Atrial tach) Longest 13 beats  Not sensed.  Rare PVC, couple No pauses NO triggered events  EKG:  EKG is  ordered today.  The ekg ordered today demonstrates sinus bradycardia.  Recent Labs: 12/03/2021: Hemoglobin 14.0; Platelets 208 01/29/2022: BUN 17; Creatinine, Ser 1.57; Potassium 4.6; Sodium 138  Recent Lipid Panel    Component Value Date/Time   CHOL 133 07/27/2021 1435   TRIG 67 07/27/2021 1435   HDL 69 07/27/2021 1435   CHOLHDL 1.9 07/27/2021 1435   LDLCALC 50 07/27/2021 1435     Home Medications   Current Meds  Medication Sig   allopurinol (ZYLOPRIM) 300 MG tablet Take 1 tablet (300 mg total) by mouth daily.   colchicine 0.6 MG tablet Take 1 tablet (0.6 mg total) by mouth daily. Please make PCP appointment.   tamsulosin (FLOMAX) 0.4 MG CAPS capsule Take 2 capsules (0.8 mg total) by mouth at bedtime.   triamcinolone cream (KENALOG) 0.1 % Apply 1 Application topically 2 (two) times daily.   [DISCONTINUED] apixaban (ELIQUIS) 5 MG TABS tablet Take 1 tablet (5 mg total) by mouth 2 (two) times daily.   [DISCONTINUED] atorvastatin (LIPITOR) 10 MG tablet Take 1 tablet (10 mg total) by mouth once daily.   [DISCONTINUED] diltiazem (CARDIZEM CD) 300 MG 24 hr capsule Take 1 capsule (300 mg total) by mouth daily.   [DISCONTINUED] flecainide (TAMBOCOR) 50 MG tablet Take 1 tablet (50 mg total) by mouth daily.   [DISCONTINUED] hydrALAZINE (APRESOLINE) 10 MG tablet Take 1 tablet (10 mg total) by mouth 2 (two) times daily.   [DISCONTINUED] metoprolol succinate (TOPROL-XL) 100 MG 24 hr tablet Take 1 tablet (100 mg total) by mouth daily. Take with or immediately following a meal.     Review of Systems      All other systems reviewed and are otherwise negative except as noted above.  Physical Exam    VS:  BP (!) 143/88   Pulse (!) 59   Ht  6\' 3"  (1.905 m)   Wt 239 lb 6.4 oz (108.6 kg)   SpO2 96%   BMI 29.92 kg/m  , BMI Body mass index is 29.92 kg/m.  Wt Readings from Last 3 Encounters:  11/19/22 239 lb 6.4 oz (108.6 kg)  09/20/22 240 lb (108.9 kg)  08/10/22 242 lb (109.8 kg)     GEN: Well nourished, well developed, in no acute distress. HEENT: normal. Neck: Supple, no JVD, carotid bruits, or masses. Cardiac: sinus bradycardia, no murmurs, rubs, or gallops. No clubbing, cyanosis, edema.  Radials/PT 2+ and equal bilaterally.  Respiratory:  Respirations regular and unlabored, clear to auscultation bilaterally. GI: Soft, nontender, nondistended. MS: No deformity or atrophy. Skin: Warm  and dry, no rash. Neuro:  Strength and sensation are intact. Psych: Normal affect.  Assessment & Plan   Hypertension Initial blood pressure elevated, but improved to 142/74 after recheck with larger cuff. Patient recently took antihypertensive medications. -Continue current regimen of Hydralazine 10mg  BID, Metoprolol 100mg  daily, and Cardizem 300mg  daily.  PAF No recent issues reported. Patient is on Eliquis 5mg  BID. -Continue Eliquis 5mg  BID.  Hyperlipidemia On Lipitor 10mg  daily. Last lipid panel was done last summer. -Continue Lipitor 10mg  daily. -ordered lipid panel today  Sleep Apnea Patient reports doing well with CPAP device. -Continue current CPAP use.  General Health Maintenance -ordered CMP and CBC today -Continue physical activity as tolerated with current job.    Disposition: Follow up 1 year with Dietrich Pates, MD or APP.  Signed, Sharlene Dory, PA-C 11/19/2022, 10:49 AM [facility]

## 2022-11-19 ENCOUNTER — Encounter: Payer: Self-pay | Admitting: Physician Assistant

## 2022-11-19 ENCOUNTER — Ambulatory Visit: Payer: Medicare HMO | Attending: Physician Assistant | Admitting: Physician Assistant

## 2022-11-19 ENCOUNTER — Other Ambulatory Visit: Payer: Self-pay

## 2022-11-19 ENCOUNTER — Encounter: Payer: Self-pay | Admitting: *Deleted

## 2022-11-19 VITALS — BP 143/88 | HR 59 | Ht 75.0 in | Wt 239.4 lb

## 2022-11-19 DIAGNOSIS — I10 Essential (primary) hypertension: Secondary | ICD-10-CM

## 2022-11-19 DIAGNOSIS — I480 Paroxysmal atrial fibrillation: Secondary | ICD-10-CM | POA: Diagnosis not present

## 2022-11-19 DIAGNOSIS — G4733 Obstructive sleep apnea (adult) (pediatric): Secondary | ICD-10-CM

## 2022-11-19 DIAGNOSIS — E785 Hyperlipidemia, unspecified: Secondary | ICD-10-CM

## 2022-11-19 MED ORDER — FLECAINIDE ACETATE 50 MG PO TABS
50.0000 mg | ORAL_TABLET | Freq: Every day | ORAL | 3 refills | Status: DC
Start: 2022-11-19 — End: 2023-11-30
  Filled 2022-11-19 – 2022-11-23 (×2): qty 90, 90d supply, fill #0
  Filled 2023-02-18: qty 90, 90d supply, fill #1
  Filled 2023-05-19: qty 90, 90d supply, fill #2
  Filled 2023-08-17: qty 90, 90d supply, fill #3

## 2022-11-19 MED ORDER — DILTIAZEM HCL ER COATED BEADS 300 MG PO CP24
300.0000 mg | ORAL_CAPSULE | Freq: Every day | ORAL | 3 refills | Status: DC
Start: 2022-11-19 — End: 2023-11-28
  Filled 2022-11-19 – 2022-12-21 (×2): qty 90, 90d supply, fill #0
  Filled 2023-03-17: qty 90, 90d supply, fill #1
  Filled 2023-06-20: qty 90, 90d supply, fill #2
  Filled 2023-09-19: qty 90, 90d supply, fill #3

## 2022-11-19 MED ORDER — APIXABAN 5 MG PO TABS
5.0000 mg | ORAL_TABLET | Freq: Two times a day (BID) | ORAL | 3 refills | Status: DC
  Filled 2022-11-19 – 2022-12-23 (×2): qty 180, 90d supply, fill #0
  Filled 2023-03-23: qty 180, 90d supply, fill #1
  Filled 2023-03-28: qty 30, 15d supply, fill #1
  Filled 2023-04-07: qty 30, 15d supply, fill #2
  Filled 2023-04-08 – 2023-04-18 (×2): qty 60, 30d supply, fill #2
  Filled 2023-05-11: qty 60, 30d supply, fill #3
  Filled 2023-06-10: qty 60, 30d supply, fill #4
  Filled 2023-07-11: qty 60, 30d supply, fill #5
  Filled 2023-08-08: qty 60, 30d supply, fill #6
  Filled 2023-09-05 – 2023-09-16 (×2): qty 60, 30d supply, fill #7
  Filled 2023-10-12: qty 60, 30d supply, fill #8
  Filled 2023-11-11: qty 60, 30d supply, fill #9

## 2022-11-19 MED ORDER — HYDRALAZINE HCL 10 MG PO TABS
10.0000 mg | ORAL_TABLET | Freq: Two times a day (BID) | ORAL | 3 refills | Status: DC
  Filled 2022-11-19: qty 180, 90d supply, fill #0

## 2022-11-19 MED ORDER — METOPROLOL SUCCINATE ER 100 MG PO TB24
100.0000 mg | ORAL_TABLET | Freq: Every day | ORAL | 3 refills | Status: DC
  Filled 2022-11-19 – 2023-02-25 (×3): qty 90, 90d supply, fill #0
  Filled 2023-05-26: qty 90, 90d supply, fill #1
  Filled 2023-08-21: qty 90, 90d supply, fill #2

## 2022-11-19 MED ORDER — ATORVASTATIN CALCIUM 10 MG PO TABS
10.0000 mg | ORAL_TABLET | Freq: Every day | ORAL | 3 refills | Status: DC
  Filled 2022-11-19 – 2023-01-24 (×2): qty 90, 90d supply, fill #0
  Filled 2023-04-22: qty 90, 90d supply, fill #1
  Filled 2023-07-19: qty 90, 90d supply, fill #2
  Filled 2023-10-12: qty 90, 90d supply, fill #3

## 2022-11-19 NOTE — Patient Instructions (Signed)
Medication Instructions:  Your physician recommends that you continue on your current medications as directed. Please refer to the Current Medication list given to you today.'  *If you need a refill on your cardiac medications before your next appointment, please call your pharmacy*   Lab Work: TODAY:  LIPID, CMET, & CBC  If you have labs (blood work) drawn today and your tests are completely normal, you will receive your results only by: MyChart Message (if you have MyChart) OR A paper copy in the mail If you have any lab test that is abnormal or we need to change your treatment, we will call you to review the results.   Testing/Procedures: None ordered   Follow-Up: At [facility], you and your health needs are our priority.  As part of our continuing mission to provide you with exceptional heart care, we have created designated Provider Care Teams.  These Care Teams include your primary Cardiologist (physician) and Advanced Practice Providers (APPs -  Physician Assistants and Nurse Practitioners) who all work together to provide you with the care you need, when you need it.  We recommend signing up for the patient portal called "MyChart".  Sign up information is provided on this After Visit Summary.  MyChart is used to connect with patients for Virtual Visits (Telemedicine).  Patients are able to view lab/test results, encounter notes, upcoming appointments, etc.  Non-urgent messages can be sent to your provider as well.   To learn more about what you can do with MyChart, go to [email or web address].    Your next appointment:   1 year(s)  Provider:   Dietrich Pates, MD  or Jari Favre, PA-C         Other Instructions

## 2022-11-23 ENCOUNTER — Other Ambulatory Visit: Payer: Self-pay

## 2022-11-25 ENCOUNTER — Other Ambulatory Visit: Payer: Self-pay

## 2022-11-25 ENCOUNTER — Encounter: Payer: Self-pay | Admitting: Internal Medicine

## 2022-11-25 ENCOUNTER — Ambulatory Visit: Payer: Medicare HMO | Attending: Internal Medicine | Admitting: Internal Medicine

## 2022-11-25 VITALS — BP 150/84 | HR 63 | Temp 98.1°F | Ht 75.0 in | Wt 241.0 lb

## 2022-11-25 DIAGNOSIS — N401 Enlarged prostate with lower urinary tract symptoms: Secondary | ICD-10-CM | POA: Diagnosis not present

## 2022-11-25 DIAGNOSIS — M1A09X0 Idiopathic chronic gout, multiple sites, without tophus (tophi): Secondary | ICD-10-CM

## 2022-11-25 DIAGNOSIS — N1832 Chronic kidney disease, stage 3b: Secondary | ICD-10-CM | POA: Diagnosis not present

## 2022-11-25 DIAGNOSIS — I10 Essential (primary) hypertension: Secondary | ICD-10-CM

## 2022-11-25 DIAGNOSIS — I480 Paroxysmal atrial fibrillation: Secondary | ICD-10-CM | POA: Diagnosis not present

## 2022-11-25 DIAGNOSIS — Z2821 Immunization not carried out because of patient refusal: Secondary | ICD-10-CM

## 2022-11-25 DIAGNOSIS — G4733 Obstructive sleep apnea (adult) (pediatric): Secondary | ICD-10-CM

## 2022-11-25 DIAGNOSIS — I700 Atherosclerosis of aorta: Secondary | ICD-10-CM

## 2022-11-25 DIAGNOSIS — R351 Nocturia: Secondary | ICD-10-CM

## 2022-11-25 MED ORDER — HYDRALAZINE HCL 25 MG PO TABS
25.0000 mg | ORAL_TABLET | Freq: Two times a day (BID) | ORAL | 1 refills | Status: DC
  Filled 2022-11-25: qty 180, 90d supply, fill #0

## 2022-11-25 MED ORDER — TAMSULOSIN HCL 0.4 MG PO CAPS
0.8000 mg | ORAL_CAPSULE | Freq: Every day | ORAL | 1 refills | Status: DC
  Filled 2022-11-25 – 2022-12-02 (×2): qty 60, 30d supply, fill #0
  Filled 2023-01-03: qty 60, 30d supply, fill #1

## 2022-11-25 MED ORDER — ALLOPURINOL 300 MG PO TABS
300.0000 mg | ORAL_TABLET | Freq: Every day | ORAL | 1 refills | Status: DC
  Filled 2022-11-25 – 2022-12-01 (×2): qty 90, 90d supply, fill #0
  Filled 2023-03-04: qty 90, 90d supply, fill #1

## 2022-11-25 MED ORDER — COLCHICINE 0.6 MG PO TABS
0.6000 mg | ORAL_TABLET | ORAL | 1 refills | Status: AC
  Filled 2022-11-25: qty 15, fill #0

## 2022-11-25 MED ORDER — COLCHICINE 0.6 MG PO TABS
0.6000 mg | ORAL_TABLET | ORAL | 1 refills | Status: DC
  Filled 2022-11-25: qty 15, fill #0

## 2022-11-25 NOTE — Progress Notes (Signed)
Patient ID: Albert Good., male    DOB: 05/11/1955  MRN: [id number]  CC: Hypertension (HTN f/u. Med refills./Discuss cardiac arrythmia & requesting blood work /No to flu & shingles vax)   Subjective: Albert Good is a 67 y.o. male who presents for chronic ds management. 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  Neg), aortic atherosclerosis on CT chest 09/2020   HM:  declines shingles, COVID and flu shot.    HTN/PAF: checks BP every 1-2 days.  Has device with him and showed some of his #s:  163/97, 164/101, 175/102, 170/93.  Currently on hydralazine 10 mg twice a day, metoprolol XL 100 mg daily, Cardizem CD3 100 mg daily, Took meds already for the morning Can do a little better with limiting salt in foods No CP/SOB/LE.  Occasional palpitations  HL/aortic atherosclerosis:  taking Lipitor 10 mg and Toprol XL 100mg    OSA on CPAP:  consistently using it  CKD 3b: GFR for the past 2 years has ranged between 29-34.  Last level check was 1 year ago.  History of gout: No recent flares.  Currently on allopurinol 300 mg daily and colchicine 0.6 mg once a day.   Request refill on Flomax for BPH.  Doing well on medication.  Patient Active Problem List   Diagnosis Date Noted   Benign neoplasm of ascending colon 04/13/2022   Hypertensive retinopathy 03/03/2022   Low testosterone in male 01/27/2022   Neoplasm of right kidney 12/22/2020   Aortic atherosclerosis (HCC) 11/27/2020   Renal mass 11/27/2020   Need for tetanus, diphtheria, and acellular pertussis (Tdap) vaccine 05/23/2020   Overweight (BMI  25.0-29.9) 01/24/2020   OSA (obstructive sleep apnea) 01/24/2020   History of adenomatous polyp of colon 01/24/2020   Chronic anticoagulation 03/28/2019   Paroxysmal atrial fibrillation (HCC) 03/12/2018   Essential hypertension 03/12/2018   BPH associated with nocturia 03/12/2018   Chronic gout of multiple sites 03/12/2018   Long-term use of aspirin therapy 03/12/2018   Chest pain 03/27/2017     Current Outpatient Medications on File Prior to Visit  Medication Sig Dispense Refill   apixaban (ELIQUIS) 5 MG TABS tablet Take 1 tablet (5 mg total) by mouth 2 (two) times daily. 180 tablet 3   atorvastatin (LIPITOR) 10 MG tablet Take 1 tablet (10 mg total) by mouth once daily. 90 tablet 3   diltiazem (CARDIZEM CD) 300 MG 24 hr capsule Take 1 capsule (300 mg total) by mouth daily. 90 capsule 3   flecainide (TAMBOCOR) 50 MG tablet Take 1 tablet (50 mg total) by mouth daily. 90 tablet 3   metoprolol succinate (TOPROL-XL) 100 MG 24 hr tablet Take 1 tablet (100 mg total) by mouth daily. Take with or immediately following a meal. 90 tablet 3   triamcinolone cream (KENALOG) 0.1 % Apply 1 Application topically 2 (two) times daily. 30 g 0   No current facility-administered medications on file prior to visit.    Allergies  Allergen Reactions   Other Hives    HAIR DYE  Lactose intolerant    Social History   Socioeconomic History   Marital status: Single    Spouse name: Not  on file   Number of children: 10   Years of education: Not on file   Highest education level: Not on file  Occupational History   Occupation: flagger  Tobacco Use   Smoking status: Former    Types: Cigarettes   Smokeless tobacco: Former    Types: Snuff    Quit date: 11/13/2020  Vaping Use   Vaping status: Never Used  Substance and Sexual Activity   Alcohol use: Yes    Comment: 2 every other day   Drug use: Not Currently   Sexual activity: Yes    Birth control/protection: None  Other Topics Concern   Not on  file  Social History Narrative   Not on file   Social Determinants of Health   Financial Resource Strain: Medium Risk (11/25/2022)   Overall Financial Resource Strain (CARDIA)    Difficulty of Paying Living Expenses: Somewhat hard  Food Insecurity: No Food Insecurity (11/25/2022)   Hunger Vital Sign    Worried About Running Out of Food in the Last Year: Never true    Ran Out of Food in the Last Year: Never true  Transportation Needs: No Transportation Needs (11/25/2022)   PRAPARE - Administrator, Civil Service (Medical): No    Lack of Transportation (Non-Medical): No  Physical Activity: Sufficiently Active (11/25/2022)   Exercise Vital Sign    Days of Exercise per Week: 6 days    Minutes of Exercise per Session: 30 min  Stress: No Stress Concern Present (11/25/2022)   [employer] of Occupational Health - Occupational Stress Questionnaire    Feeling of Stress : Not at all  Social Connections: Socially Isolated (11/25/2022)   Social Connection and Isolation Panel [NHANES]    Frequency of Communication with Friends and Family: Never    Frequency of Social Gatherings with Friends and Family: Never    Attends Religious Services: Never    Database administrator or Organizations: No    Attends Banker Meetings: Never    Marital Status: Never married  Intimate Partner Violence: Not At Risk (11/25/2022)   Humiliation, Afraid, Rape, and Kick questionnaire    Fear of Current or Ex-Partner: No    Emotionally Abused: No    Physically Abused: No    Sexually Abused: No    Family History  Problem Relation Age of Onset   Diabetes Mother    Hypertension Father    Diabetes Sister    Diabetes Brother    Heart failure Brother    Kidney failure Brother    Colon cancer Neg Hx    Stomach cancer Neg Hx    Esophageal cancer Neg Hx     Past Surgical History:  Procedure Laterality Date   COLONOSCOPY WITH PROPOFOL N/A 04/13/2022   Procedure: COLONOSCOPY WITH  PROPOFOL;  Surgeon: Sherrilyn Rist, MD;  Location: [facility];  Service: Gastroenterology;  Laterality: N/A;   LIPOSUCTION     POLYPECTOMY  04/13/2022   Procedure: POLYPECTOMY;  Surgeon: Sherrilyn Rist, MD;  Location: [facility];  Service: Gastroenterology;;   ROBOTIC ASSITED PARTIAL NEPHRECTOMY Right 12/22/2020   Procedure: XI ROBOTIC ASSITED  NEPHRECTOMY;  Surgeon: Heloise Purpura, MD;  Location: [facility];  Service: Urology;  Laterality: Right;    ROS: Review of Systems Negative except as stated above  PHYSICAL EXAM: BP (!) 150/84   Pulse 63   Temp 98.1 F (36.7 C) (Oral)   Ht 6\' 3"  (1.905 m)   Wt 241 lb (  109.3 kg)   SpO2 94%   BMI 30.12 kg/m   Physical Exam Blood pressure with patient's device was 150/91  General appearance - alert, well appearing, and in no distress Mental status - normal mood, behavior, speech, dress, motor activity, and thought processes Neck - supple, no significant adenopathy Chest - clear to auscultation, no wheezes, rales or rhonchi, symmetric air entry Heart -rate control.  Irregularly irregular Extremities -no lower extremity edema     Latest Ref Rng & Units 01/29/2022    3:49 PM 12/03/2021    3:20 PM 11/30/2021   11:20 AM  CMP  Glucose 70 - 99 mg/dL 75  811  89   BUN 8 - 27 mg/dL 17  18  17    Creatinine 0.76 - 1.27 mg/dL 9.14  7.82  9.56   Sodium 134 - 144 mmol/L 138  139  141   Potassium 3.5 - 5.2 mmol/L 4.6  3.8  4.1   Chloride 96 - 106 mmol/L 102  107  105   CO2 20 - 29 mmol/L 22  24  23    Calcium 8.6 - 10.2 mg/dL 9.2  8.6  9.3    Lipid Panel     Component Value Date/Time   CHOL 133 07/27/2021 1435   TRIG 67 07/27/2021 1435   HDL 69 07/27/2021 1435   CHOLHDL 1.9 07/27/2021 1435   LDLCALC 50 07/27/2021 1435    CBC    Component Value Date/Time   WBC 5.2 12/03/2021 1409   RBC 4.91 12/03/2021 1409   HGB 14.0 12/03/2021 1409   HGB 13.6 07/27/2021 1435   HCT 43.1 12/03/2021 1409   HCT 41.4 07/27/2021 1435   PLT 208  12/03/2021 1409   PLT 203 07/27/2021 1435   MCV 87.8 12/03/2021 1409   MCV 86 07/27/2021 1435   MCH 28.5 12/03/2021 1409   MCHC 32.5 12/03/2021 1409   RDW 14.9 12/03/2021 1409   RDW 14.4 07/27/2021 1435   LYMPHSABS 2.9 09/28/2018 1000   EOSABS 0.2 09/28/2018 1000   BASOSABS 0.1 09/28/2018 1000    ASSESSMENT AND PLAN: 1. Essential hypertension Not at goal.  I recommend increasing hydralazine to 25 mg twice a day.  He still has quite a number of his 10 mg tablets.  I told him to take 2 of them twice a day which would be 20 mg until he runs out of his current bottle.  The new prescription will then be for the 25 mg twice a day.  We will have him follow-up with the clinical pharmacist in about 3 weeks for recheck.  DASH diet encouraged. - CBC - Comprehensive metabolic panel - hydrALAZINE (APRESOLINE) 25 MG tablet; Take 1 tablet (25 mg total) by mouth 2 (two) times daily.  Dispense: 180 tablet; Refill: 1  2. Paroxysmal atrial fibrillation (HCC) Followed by cardiology.  He is on Eliquis, flecainide, metoprolol and Cardizem.  Adequately rate controlled.  3. Stage 3b chronic kidney disease (HCC) Advised to avoid NSAIDs. Stop daily colchicine.  Changed to as needed. - Ambulatory referral to Nephrology  4. Chronic gout of multiple sites, unspecified cause - allopurinol (ZYLOPRIM) 300 MG tablet; Take 1 tablet (300 mg total) by mouth daily.  Dispense: 90 tablet; Refill: 1 - colchicine 0.6 MG tablet; Take 1 tablet (0.6 mg total) by mouth daily PRN for 3-4 days during gout flare.  Dispense: 15 tablet; Refill: 1  5. BPH associated with nocturia - tamsulosin (FLOMAX) 0.4 MG CAPS capsule; Take 2 capsules (0.8 mg  total) by mouth at bedtime.  Dispense: 60 capsule; Refill: 1  6. OSA on CPAP Continue consistent use of his CPAP  7. Aortic atherosclerosis (HCC) Continue atorvastatin - Lipid panel  8. Influenza vaccination declined   9. Herpes zoster vaccination declined   Patient was given  the opportunity to ask questions.  Patient verbalized understanding of the plan and was able to repeat key elements of the plan.   This documentation was completed using Paediatric nurse.  Any transcriptional errors are unintentional.  Orders Placed This Encounter  Procedures   CBC   Comprehensive metabolic panel   Lipid panel   Ambulatory referral to Nephrology     Requested Prescriptions   Signed Prescriptions Disp Refills   allopurinol (ZYLOPRIM) 300 MG tablet 90 tablet 1    Sig: Take 1 tablet (300 mg total) by mouth daily.   tamsulosin (FLOMAX) 0.4 MG CAPS capsule 60 capsule 1    Sig: Take 2 capsules (0.8 mg total) by mouth at bedtime.   hydrALAZINE (APRESOLINE) 25 MG tablet 180 tablet 1    Sig: Take 1 tablet (25 mg total) by mouth 2 (two) times daily.   colchicine 0.6 MG tablet 15 tablet 1    Sig: 1 tab daily PRN for 3-4 days only during acute gout attack    Return in about 4 months (around 03/25/2023) for Appt with [facility] in 3 wks for BP check.  Jonah Blue, MD, FACP

## 2022-11-26 ENCOUNTER — Other Ambulatory Visit: Payer: Self-pay

## 2022-11-26 LAB — COMPREHENSIVE METABOLIC PANEL
ALT: 17 [IU]/L (ref 0–44)
AST: 23 [IU]/L (ref 0–40)
Albumin: 4.4 g/dL (ref 3.9–4.9)
Alkaline Phosphatase: 95 [IU]/L (ref 44–121)
BUN/Creatinine Ratio: 11 (ref 10–24)
BUN: 17 mg/dL (ref 8–27)
Bilirubin Total: 0.4 mg/dL (ref 0.0–1.2)
CO2: 21 mmol/L (ref 20–29)
Calcium: 9.1 mg/dL (ref 8.6–10.2)
Chloride: 106 mmol/L (ref 96–106)
Creatinine, Ser: 1.56 mg/dL — ABNORMAL HIGH (ref 0.76–1.27)
Globulin, Total: 2.5 g/dL (ref 1.5–4.5)
Glucose: 93 mg/dL (ref 70–99)
Potassium: 4.3 mmol/L (ref 3.5–5.2)
Sodium: 145 mmol/L — ABNORMAL HIGH (ref 134–144)
Total Protein: 6.9 g/dL (ref 6.0–8.5)
eGFR: 48 mL/min/{1.73_m2} — ABNORMAL LOW (ref >59)

## 2022-11-26 LAB — CBC
Hematocrit: 43.9 % (ref 37.5–51.0)
Hemoglobin: 13.8 g/dL (ref 13.0–17.7)
MCH: 27.6 pg (ref 26.6–33.0)
MCHC: 31.4 g/dL — ABNORMAL LOW (ref 31.5–35.7)
MCV: 88 fL (ref 79–97)
Platelets: 213 10*3/uL (ref 150–450)
RBC: 5 x10E6/uL (ref 4.14–5.80)
RDW: 14.3 % (ref 11.6–15.4)
WBC: 5.2 10*3/uL (ref 3.4–10.8)

## 2022-11-26 LAB — LIPID PANEL
Chol/HDL Ratio: 2.1 ratio (ref 0.0–5.0)
Cholesterol, Total: 142 mg/dL (ref 100–199)
HDL: 69 mg/dL (ref >39)
LDL Chol Calc (NIH): 56 mg/dL (ref 0–99)
Triglycerides: 89 mg/dL (ref 0–149)
VLDL Cholesterol Cal: 17 mg/dL (ref 5–40)

## 2022-12-01 DIAGNOSIS — G4733 Obstructive sleep apnea (adult) (pediatric): Secondary | ICD-10-CM | POA: Diagnosis not present

## 2022-12-02 ENCOUNTER — Other Ambulatory Visit: Payer: Self-pay

## 2022-12-02 MED ORDER — INFLUENZA VAC A&B SURF ANT ADJ 0.5 ML IM SUSY
0.5000 mL | PREFILLED_SYRINGE | Freq: Once | INTRAMUSCULAR | 0 refills | Status: AC
  Filled 2022-12-02 – 2022-12-06 (×3): qty 0.5, 1d supply, fill #0

## 2022-12-03 ENCOUNTER — Other Ambulatory Visit: Payer: Self-pay

## 2022-12-06 ENCOUNTER — Other Ambulatory Visit: Payer: Self-pay

## 2022-12-08 ENCOUNTER — Other Ambulatory Visit: Payer: Self-pay

## 2022-12-08 MED ORDER — INFLUENZA VAC A&B SURF ANT ADJ 0.5 ML IM SUSY
0.5000 mL | PREFILLED_SYRINGE | Freq: Once | INTRAMUSCULAR | 0 refills | Status: AC
  Filled 2022-12-08: qty 0.5, 1d supply, fill #0

## 2022-12-21 ENCOUNTER — Other Ambulatory Visit: Payer: Self-pay

## 2022-12-23 ENCOUNTER — Other Ambulatory Visit: Payer: Self-pay

## 2022-12-24 ENCOUNTER — Ambulatory Visit: Payer: Medicare HMO | Attending: Internal Medicine | Admitting: Pharmacist

## 2022-12-24 ENCOUNTER — Other Ambulatory Visit: Payer: Self-pay

## 2022-12-24 ENCOUNTER — Encounter: Payer: Self-pay | Admitting: Pharmacist

## 2022-12-24 DIAGNOSIS — I10 Essential (primary) hypertension: Secondary | ICD-10-CM

## 2022-12-24 MED ORDER — HYDRALAZINE HCL 25 MG PO TABS
25.0000 mg | ORAL_TABLET | Freq: Three times a day (TID) | ORAL | 1 refills | Status: DC
  Filled 2022-12-24 – 2023-01-10 (×6): qty 270, 90d supply, fill #0

## 2022-12-24 NOTE — Progress Notes (Signed)
S:     No chief complaint on file.  67 y.o. male who presents for hypertension evaluation, education, and management. 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  Neg), aortic atherosclerosis on CT chest 09/2020.   Patient was referred and last seen by Primary Care Provider, Dr. Laural Benes, on 11/25/2022. BP at that visit was 150/84. Pt endorsed good medication adherence. His hydralazine dose was increased to 25mg  BID. He was instructed to return for BP check today.   Today, patient arrives in good spirits and presents without assistance. Denies dizziness, headache, blurred vision, swelling.   Family/Social history:  -Fhx: DM, HTN -Tobacco: former smoker (quit in 2022) -Alcohol: none reported   Medication adherence reported. Patient has taken BP medications today.   Current antihypertensives include: diltiazem CD 300 mg daily, hydralazine 25 mg BID, Toprol-XL 100 mg daily   Antihypertensives tried in the past include: irbesartan (AKI), valsartan (backorder at the time)  Reported home BP readings:  -"Up and Down"  Patient reported dietary habits:  -Compliant with sodium restriction  -Denies caffeine intake   Patient-reported exercise habits: none reported  O:  Vitals:   12/24/22 1041  BP: (!) 146/80  Pulse: (!) 50      Last 3 Office BP readings: BP Readings from Last 3 Encounters:  12/24/22 (!) 146/80  11/25/22 (!) 150/84  11/19/22 (!) 143/88    BMET    Component Value Date/Time   NA 145 (H) 11/25/2022 1331   K 4.3 11/25/2022 1331   CL 106 11/25/2022 1331   CO2 21 11/25/2022 1331   GLUCOSE 93 11/25/2022 1331   GLUCOSE  115 (H) 12/03/2021 1520   BUN 17 11/25/2022 1331   CREATININE 1.56 (H) 11/25/2022 1331   CALCIUM 9.1 11/25/2022 1331   GFRNONAA 34 (L) 12/03/2021 1520   GFRAA 69 08/30/2019 0943    Renal function: CrCl cannot be calculated (Patient's most recent lab result is older than the maximum 21 days allowed.).  Clinical ASCVD: aortic atherosclerosis noted on CT scan 2022 The 10-year ASCVD risk score (Arnett DK, et al., 2019) is: 19.2%   Values used to calculate the score:     Age: 13 years     Sex: Male     Is Non-Hispanic African American: Yes     Diabetic: No     Tobacco smoker: No     Systolic Blood Pressure: 146 mmHg     Is BP treated: Yes     HDL Cholesterol: 69 mg/dL     Total Cholesterol: 142 mg/dL  A/P: Hypertension longstanding currently above goal on current medications. BP goal < 130/80 mmHg. Medication adherence appears appropriate.  -Increased hydralazine to 25mg  TID.  -Continue diltiazem and metoprolol at current doses.  -Counseled on lifestyle modifications for blood pressure control including reduced dietary sodium, increased exercise, adequate sleep. -Encouraged patient to check BP at home and bring log of readings to next visit. Counseled on proper use of home BP cuff.    Results reviewed and written information provided.    Written patient instructions provided. Patient verbalized understanding of treatment plan.  Total time in face to face counseling 20 minutes.    Follow-up:  Pharmacist  in 1 month.   Butch Penny, PharmD, Patsy [place], CPP Clinical Pharmacist [facility] [phone]

## 2022-12-27 ENCOUNTER — Other Ambulatory Visit: Payer: Self-pay

## 2022-12-28 ENCOUNTER — Other Ambulatory Visit: Payer: Self-pay

## 2022-12-31 DIAGNOSIS — G4733 Obstructive sleep apnea (adult) (pediatric): Secondary | ICD-10-CM | POA: Diagnosis not present

## 2023-01-03 ENCOUNTER — Other Ambulatory Visit: Payer: Self-pay

## 2023-01-06 ENCOUNTER — Other Ambulatory Visit: Payer: Self-pay

## 2023-01-07 ENCOUNTER — Other Ambulatory Visit: Payer: Self-pay

## 2023-01-10 ENCOUNTER — Other Ambulatory Visit: Payer: Self-pay

## 2023-01-11 ENCOUNTER — Other Ambulatory Visit: Payer: Self-pay

## 2023-01-24 ENCOUNTER — Encounter: Payer: Self-pay | Admitting: Pharmacist

## 2023-01-24 ENCOUNTER — Other Ambulatory Visit: Payer: Self-pay

## 2023-01-24 ENCOUNTER — Ambulatory Visit: Payer: Medicare HMO | Attending: Internal Medicine | Admitting: Pharmacist

## 2023-01-24 VITALS — BP 150/93 | HR 63

## 2023-01-24 DIAGNOSIS — I10 Essential (primary) hypertension: Secondary | ICD-10-CM

## 2023-01-24 MED ORDER — HYDRALAZINE HCL 50 MG PO TABS
50.0000 mg | ORAL_TABLET | Freq: Three times a day (TID) | ORAL | 3 refills | Status: DC
  Filled 2023-01-24: qty 270, 90d supply, fill #0
  Filled 2023-04-18 – 2023-06-03 (×5): qty 270, 90d supply, fill #1
  Filled 2023-06-03: qty 90, 30d supply, fill #1
  Filled 2023-06-03: qty 270, 90d supply, fill #1
  Filled 2023-06-03: qty 180, 60d supply, fill #1
  Filled 2023-08-10: qty 180, 60d supply, fill #2
  Filled 2023-10-12: qty 180, 60d supply, fill #3

## 2023-01-24 NOTE — Progress Notes (Signed)
 S:     Chief Complaint  Patient presents with   Hypertension   68 y.o. male who presents for hypertension evaluation, education, and management. 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  Neg), aortic atherosclerosis on CT chest 09/2020.   Patient was referred and last seen by Primary Care Provider, Dr. Vicci, on 11/25/2022. BP at that visit was 150/84. Seen by pharmacy on 12/24/22 and  BP was 146/80 - hydralazine  was increased to 25 mg PO TID.   Today, patient arrives in good spirits and presents without assistance. Denies dizziness, headache, blurred vision, swelling. He has made the increase to hydralazine  25mg  TID and is tolerating this well. Has taken his BP medication this morning. He reports that he has been planning to talk with her urologist about seeing a small amount of blood in his urine. Denies any other abnormal bleeding/bruising on Eliquis .   Family/Social history:  -Fhx: DM, HTN -Tobacco: former smoker (quit in 2022) -Alcohol: none reported   Medication adherence reported. Patient has taken BP medications today. He has an alarm set to remember to take hydralazine  (set at (9:30AM, 3:30PM, and 9:30 PM). Denies missed doses.  Current antihypertensives include: diltiazem  CD 300 mg daily, hydralazine  25 mg TID, Toprol -XL 100 mg daily   Antihypertensives tried in the past include: irbesartan  (AKI), valsartan  (backorder at the time)  Reported home BP readings: has not checked at home recently  Patient reported dietary habits:  -Compliant with sodium restriction  -Denies caffeine intake  -No changes since last visit.    Patient-reported exercise habits: none reported. No changes since last visit.   O:  Vitals:   01/24/23 1111 01/24/23 1113  BP: (!) 154/85 (!) 150/93  Pulse: 65 63    Last 3 Office BP readings: BP Readings from Last 3 Encounters:  01/24/23 (!) 150/93  12/24/22 (!) 146/80  11/25/22 (!) 150/84   BMET    Component Value Date/Time   NA 145 (H) 11/25/2022 1331   K 4.3 11/25/2022 1331   CL 106 11/25/2022 1331   CO2 21 11/25/2022 1331   GLUCOSE 93 11/25/2022 1331   GLUCOSE 115 (H) 12/03/2021 1520   BUN 17 11/25/2022 1331   CREATININE 1.56 (H) 11/25/2022 1331   CALCIUM  9.1 11/25/2022 1331   GFRNONAA 34 (L) 12/03/2021 1520   GFRAA 69 08/30/2019 0943    Renal function: CrCl cannot be calculated (Patient's most recent lab result is older than the maximum 21 days allowed.).  Clinical ASCVD: aortic atherosclerosis noted on CT scan 2022 The 10-year ASCVD risk score (Arnett DK, et al., 2019) is: 20.1%   Values used to calculate the score:     Age: 53 years     Sex: Male     Is Non-Hispanic African American: Yes     Diabetic: No     Tobacco smoker: No     Systolic Blood Pressure: 150 mmHg     Is BP treated: Yes     HDL Cholesterol: 69 mg/dL     Total Cholesterol: 142 mg/dL  A/P: Hypertension longstanding currently above goal on current medications. BP goal < 130/80 mmHg. Medication adherence appears appropriate. Pt is not a candidate for  other first line therapies including DHP CCB given current treatment with diltiazem , ARB due to hx of AKI on irbesartan , and thiazide due to chronic gout. Therefore, will continue to titrate hydralazine .  -Increased hydralazine  to 50mg  TID. Patient instructed that he could use up his remaining 25 mg tablets by taking 2 tablets three times daily, or pick up the higher strength tablet from the pharmacy today. Patient planning to use up his current supply first.  -Continue diltiazem  and metoprolol  at current doses.  -Counseled on lifestyle  modifications for blood pressure control including reduced dietary sodium, increased exercise, adequate sleep. Patient is currently being worked up for OSA.  -Encouraged patient to check BP at home and bring log of readings to next visit. Counseled on proper use of home BP cuff.  - Encouraged patient to connect with urologist concerning urinary symptoms/hematuria.    Results reviewed and written information provided.    Written patient instructions provided. Patient verbalized understanding of treatment plan.  Total time in face to face counseling 20 minutes.    Follow-up:  Pharmacist in 1 month, 02/21/23  Lorain Baseman, PharmD PGY1 Pharmacy Resident  Herlene Fleeta Morris, PharmD, BCACP, CPP Clinical Pharmacist [facility] [phone]

## 2023-01-27 ENCOUNTER — Other Ambulatory Visit: Payer: Self-pay

## 2023-01-27 ENCOUNTER — Other Ambulatory Visit: Payer: Self-pay | Admitting: Internal Medicine

## 2023-01-27 DIAGNOSIS — N401 Enlarged prostate with lower urinary tract symptoms: Secondary | ICD-10-CM

## 2023-01-27 MED ORDER — TAMSULOSIN HCL 0.4 MG PO CAPS
0.8000 mg | ORAL_CAPSULE | Freq: Every day | ORAL | 0 refills | Status: DC
  Filled 2023-01-27 – 2023-02-08 (×2): qty 180, 90d supply, fill #0

## 2023-01-27 NOTE — Telephone Encounter (Signed)
Requested medication (s) are due for refill today: Yes  Requested medication (s) are on the active medication list: Yes  Last refill:  11/25/22, #60, 1RF  Future visit scheduled: Yes  Notes to clinic:  Unable to refill per protocol due to failed labs, no updated results. PSA lab missing     Requested Prescriptions  Pending Prescriptions Disp Refills   tamsulosin (FLOMAX) 0.4 MG CAPS capsule 60 capsule 1    Sig: Take 2 capsules (0.8 mg total) by mouth at bedtime.     Urology: Alpha-Adrenergic Blocker Failed - 01/27/2023  2:25 PM      Failed - PSA in normal range and within 360 days    No results found for: "LABPSA", "PSA", "PSA1", "ULTRAPSA"       Failed - Last BP in normal range    BP Readings from Last 1 Encounters:  01/24/23 (!) 150/93         Passed - Valid encounter within last 12 months    Recent Outpatient Visits           3 days ago Essential hypertension   [facility] [place]. [facility] Lois Huxley, Cornelius Moras, RPH-CPP   1 month ago Essential hypertension   [place] [facility] - [facility] Dept Of [place]. [facility] Drucilla Chalet, RPH-CPP   2 months ago Essential hypertension   [place] [facility] Dept Of [place]. [facility] Marcine Matar, MD   12 months ago Essential hypertension   [facility] [facility] - A Dept Of [place]. [facility] Marcine Matar, MD   1 year ago Essential hypertension   [place] [facility] - A Dept Of [place]. [place] [facility] Drucilla Chalet, RPH-CPP       Future Appointments             In 3 weeks Lois Huxley, Cornelius Moras, RPH-CPP [place] [facility] - [facility] Dept Of [place]. [facility]   In 2 months Laural Benes, Binnie Rail, MD [facility] [place] - A Dept Of [place]igha Bridegroom. [facility]

## 2023-01-31 ENCOUNTER — Other Ambulatory Visit: Payer: Self-pay

## 2023-02-07 ENCOUNTER — Other Ambulatory Visit: Payer: Self-pay

## 2023-02-08 ENCOUNTER — Other Ambulatory Visit: Payer: Self-pay

## 2023-02-08 DIAGNOSIS — N1832 Chronic kidney disease, stage 3b: Secondary | ICD-10-CM | POA: Diagnosis not present

## 2023-02-08 DIAGNOSIS — I4891 Unspecified atrial fibrillation: Secondary | ICD-10-CM | POA: Diagnosis not present

## 2023-02-08 DIAGNOSIS — D49511 Neoplasm of unspecified behavior of right kidney: Secondary | ICD-10-CM | POA: Diagnosis not present

## 2023-02-08 DIAGNOSIS — M109 Gout, unspecified: Secondary | ICD-10-CM | POA: Diagnosis not present

## 2023-02-08 DIAGNOSIS — I129 Hypertensive chronic kidney disease with stage 1 through stage 4 chronic kidney disease, or unspecified chronic kidney disease: Secondary | ICD-10-CM | POA: Diagnosis not present

## 2023-02-08 LAB — LAB REPORT - SCANNED
Albumin, Urine POC: 33
Albumin/Creatinine Ratio, Urine, POC: 47
Creatinine, POC: 70.5 mg/dL

## 2023-02-09 ENCOUNTER — Other Ambulatory Visit: Payer: Self-pay

## 2023-02-10 ENCOUNTER — Other Ambulatory Visit: Payer: Self-pay

## 2023-02-16 DIAGNOSIS — R3912 Poor urinary stream: Secondary | ICD-10-CM | POA: Diagnosis not present

## 2023-02-16 DIAGNOSIS — R972 Elevated prostate specific antigen [PSA]: Secondary | ICD-10-CM | POA: Diagnosis not present

## 2023-02-16 DIAGNOSIS — Z85528 Personal history of other malignant neoplasm of kidney: Secondary | ICD-10-CM | POA: Diagnosis not present

## 2023-02-16 DIAGNOSIS — N401 Enlarged prostate with lower urinary tract symptoms: Secondary | ICD-10-CM | POA: Diagnosis not present

## 2023-02-20 NOTE — Progress Notes (Signed)
S:     No chief complaint on file.  68 y.o. male who presents for hypertension evaluation, education, and management. 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 > Negative), aortic atherosclerosis on CT chest 09/2020.   Patient was referred and last seen by Primary Care Provider, Dr. Laural Benes, on 11/25/2022. BP at that visit was 150/84. Seen by pharmacy on 12/24/22 and  BP was 146/80 - hydralazine was increased to 25 mg PO TID. At last appt on 01/24/23, BP was 150/93 and hydralazine was increased to 50 mg PO TID.  Today, patient arrives in good spirits and presents without assistance. Denies dizziness, headache, blurred vision, swelling. He has made the increase to hydralazine 50mg  TID and is tolerating this well. Has taken his BP medication this morning. Reports that blood in urine has gone away - he will notify urologist if this happens again. Denies any other abnormal bleeding/bruising on Eliquis. He has started occasionally checking his BP at home.   Family/Social history:  -Fhx: DM, HTN -Tobacco: former smoker (quit in 2022) -Alcohol: none reported   Medication adherence reported. Patient has taken BP medications today. He has an alarm set to remember to take hydralazine (set at (9:30AM, 3:30PM, and 9:30 PM). Endorses that he occasionally misses the midday dose of hydralazine if he is out and forgot to bring his medication with him. .   Current antihypertensives include: diltiazem CD 300 mg daily (AM), hydralazine 50 mg TID, Toprol-XL 100 mg daily(AM)   Antihypertensives tried in the past include: irbesartan (AKI), valsartan (backorder at the  time)  Reported home BP readings: has been checking at home: 131/77, 122/78, 154/94, . A couple of times he took his BP when he woke up, prior to taking his medication vs just sitting. Cuff goes around his upper arm - reports that it fits well. HR 54, 55, 63, 62.  Upper arm BP cuff has been validated by Dr. Alvis Lemmings, per patient.   Patient reported dietary habits:  -Compliant with sodium restriction  -Denies caffeine intake  -No changes since last visit.   Patient-reported exercise habits: none reported. No changes since last visit.   O:  There were no vitals filed for this visit.   Last 3 Office BP readings: BP Readings from Last 3 Encounters:  01/24/23 (!) 150/93  12/24/22 (!) 146/80  11/25/22 (!) 150/84    BMET    Component Value Date/Time   NA 145 (H) 11/25/2022 1331   K 4.3 11/25/2022 1331   CL 106 11/25/2022 1331   CO2 21 11/25/2022 1331   GLUCOSE 93 11/25/2022 1331   GLUCOSE 115 (H) 12/03/2021 1520   BUN 17 11/25/2022 1331   CREATININE 1.56 (H) 11/25/2022 1331   CALCIUM 9.1 11/25/2022 1331   GFRNONAA 34 (L) 12/03/2021 1520   GFRAA 69 08/30/2019 0943   Last eGFR estimated 48 mL/min  Renal function: CrCl cannot be calculated (Patient's most recent lab result is older than the maximum 21 days allowed.).  Clinical ASCVD: aortic atherosclerosis noted on CT scan 2022 The 10-year ASCVD risk score (Arnett DK, et al., 2019) is: 20.1%   Values used to calculate the score:     Age: 27  years     Sex: Male     Is Non-Hispanic African American: Yes     Diabetic: No     Tobacco smoker: No     Systolic Blood Pressure: 150 mmHg     Is BP treated: Yes     HDL Cholesterol: 69 mg/dL     Total Cholesterol: 142 mg/dL   A/P: Hypertension longstanding and close goal <130/80 mmHg on current medications. Home readings at at goal, while clinic BP remains slightly elevated. Medication adherence appears optimal. Pt is not a candidate for other first line therapies including DHP CCB  given current treatment with diltiazem, ARB due to hx of AKI on irbesartan, and thiazide due to chronic gout. Patient amenable to increasing frequency of home BP monitoring. If home readings are elevated at follow-up, can consider transitioning from metoprolol succinate to carvedilol vs increasing hydralazine to 75 mg PO TID. Emphasized importance of adherence to BP medications.  -Continued hydralazine 50mg  TID.  -Continue diltiazem and metoprolol at current doses.  -Counseled on lifestyle modifications for blood pressure control including reduced dietary sodium, staying hydrated ,increased exercise, adequate sleep. Patient is currently being worked up for OSA.  -Encouraged patient to check BP at home at least 2-3 times per week and bring log of readings to next visit. Counseled on proper use of home BP cuff.  -Patient due for UACR given CKD3b and longstanding hypertension- ordered today. Of note, patient has no history of A1c monitoring. - Encouraged patient to connect with urologist concerning urinary symptoms/hematuria.   Results reviewed and written information provided.    Written patient instructions provided. Patient verbalized understanding of treatment plan.  Total time in face to face counseling 20 minutes.    Follow-up:  PCP 03/28/23 Pharmacist in 2 month, 04/25/23  Nils Pyle, PharmD PGY1 Pharmacy Resident  Butch Penny, PharmD, BCACP, CPP Clinical Pharmacist [facility] [phone]

## 2023-02-21 ENCOUNTER — Ambulatory Visit: Payer: Medicare HMO | Attending: Internal Medicine | Admitting: Pharmacist

## 2023-02-21 ENCOUNTER — Encounter: Payer: Self-pay | Admitting: Pharmacist

## 2023-02-21 VITALS — BP 138/83 | HR 58

## 2023-02-21 DIAGNOSIS — I10 Essential (primary) hypertension: Secondary | ICD-10-CM | POA: Diagnosis not present

## 2023-02-21 DIAGNOSIS — N1832 Chronic kidney disease, stage 3b: Secondary | ICD-10-CM | POA: Diagnosis not present

## 2023-02-21 IMAGING — MR MR ABDOMEN WO/W CM
11 of 16 series · 27 of 48 positions shown · IV contrast (Multihance)
Comparison: CT of 09/23/2020

CLINICAL DATA: Cystic right renal mass on prior CT performed for
left lower quadrant pain.

EXAM:
MRI ABDOMEN WITHOUT AND WITH CONTRAST
TECHNIQUE: Multiplanar multisequence MR imaging of the abdomen was performed
both before and after the administration of intravenous contrast.
CONTRAST:  20mL MULTIHANCE GADOBENATE DIMEGLUMINE 529 MG/ML IV SOLN

[Series 3: T2 · axial · 6.5mm · 0.74mm/px · 1 of 37 slices shown (1 of 2)]
[im 1/37]
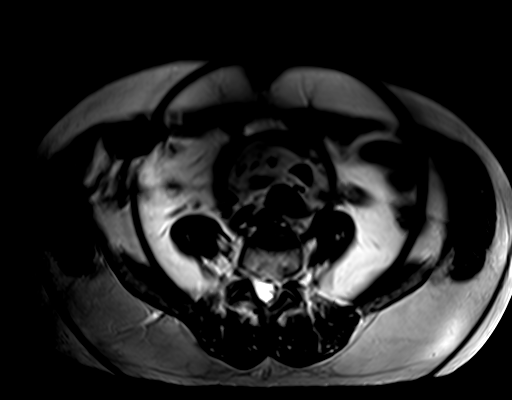

[Series 4: T2 · coronal · 5.0mm · 1.56mm/px · 1 of 39 slices shown (2 of 2)]
[im 1/39]
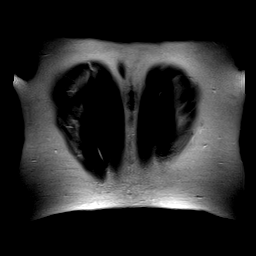

[Series 5: axial tru fisp · axial · 4.0mm · 1.56mm/px · 1 of 46 slices shown]
[im 1/46]
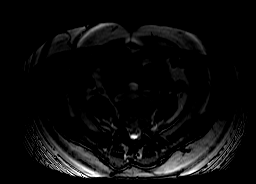

[Series 6: ep2d_diff_b50_500_800_p2 · axial · 6.0mm · 1.98mm/px · z∈[-140,+133]mm · 3 of 108 slices shown]
[im 1/108]
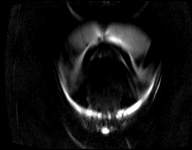
[im 54/108]
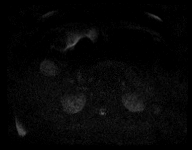
[im 108/108]
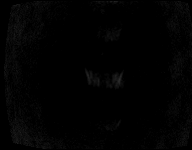

[Series 7: ep2d_[id number]_500_800_p2_adc · axial · 6.0mm · 1.98mm/px · 1 of 36 slices shown]
[im 1/36]
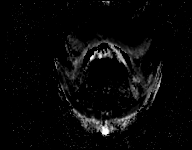

[Series 8: axial in out · axial · 5.5mm · 0.78mm/px · z∈[-130,+116]mm · 2 of 80 slices shown]
[im 1/80]
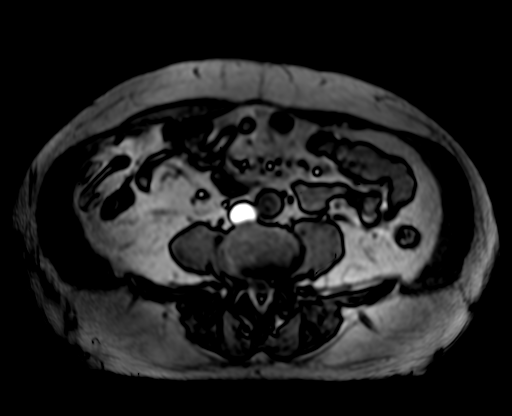
[im 80/80]
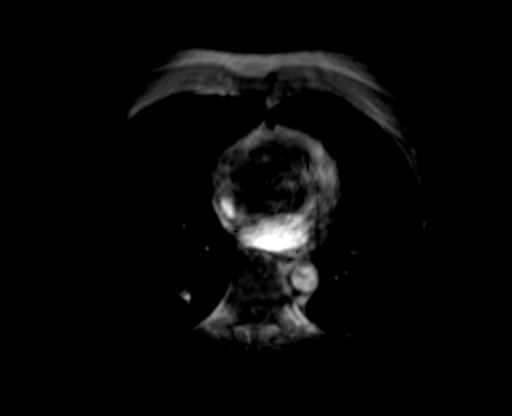

[Series 9: T1 dynamic · axial · non-contrast · 2.5mm · 0.78mm/px · z∈[-126,+112]mm · 4 of 96 slices shown]
[im 1/96]
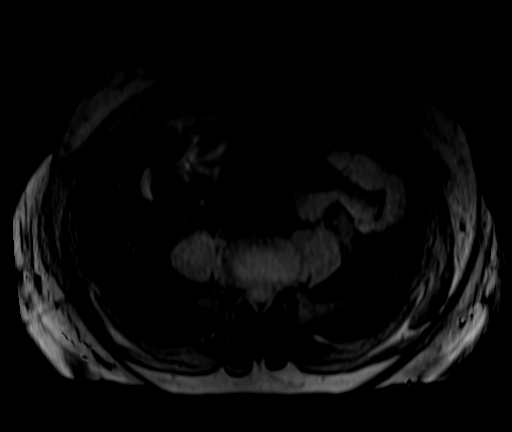
[im 32/96]
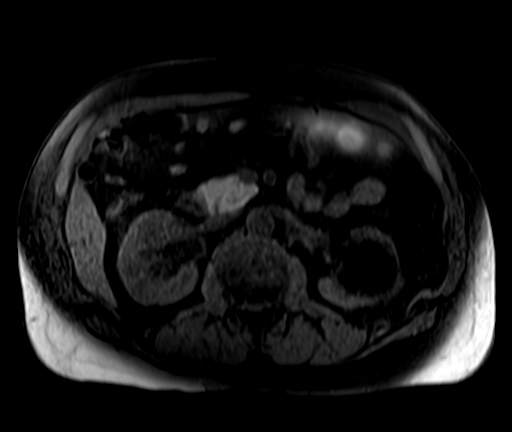
[im 64/96]
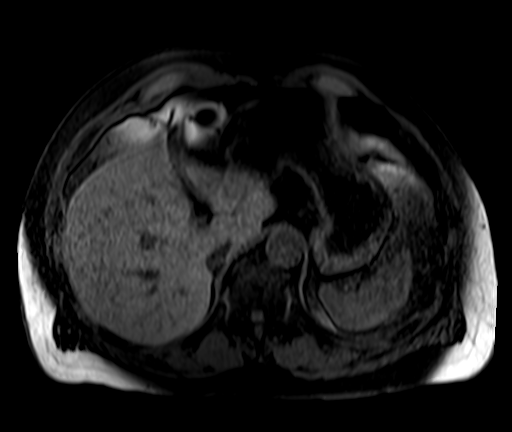
[im 96/96]
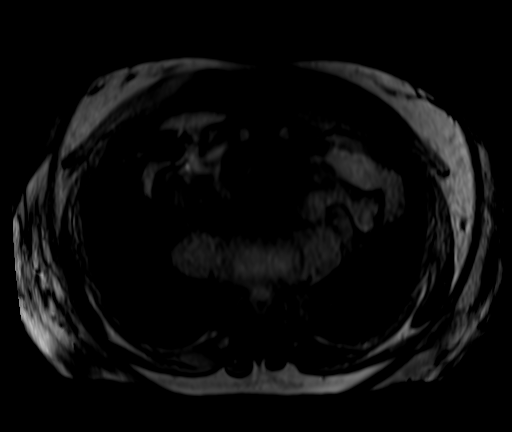

[Series 10: post 25 sec · axial · 2.5mm · 0.78mm/px · z∈[-126,+112]mm · 4 of 96 slices shown]
[im 1/96]
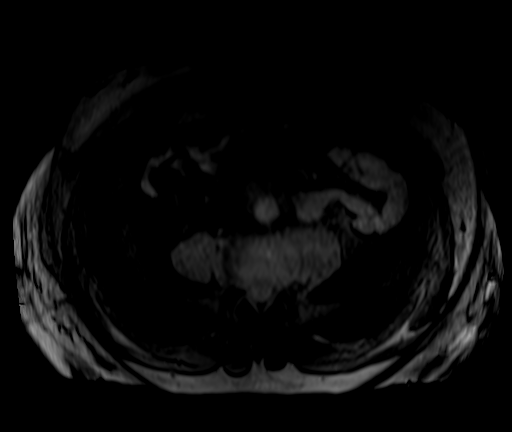
[im 32/96]
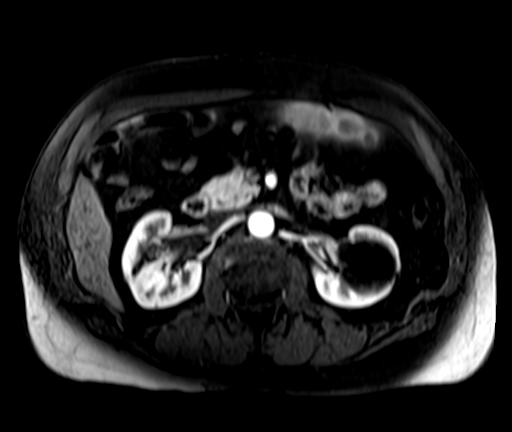
[im 64/96]
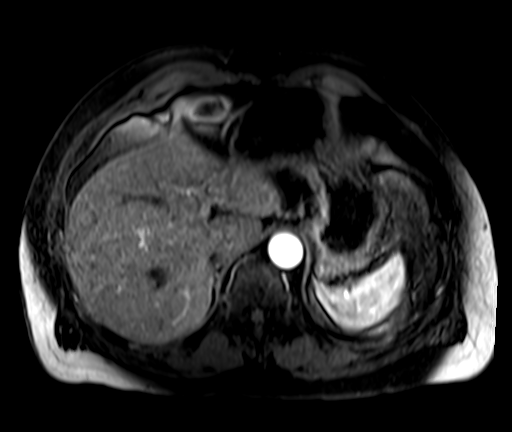
[im 96/96]
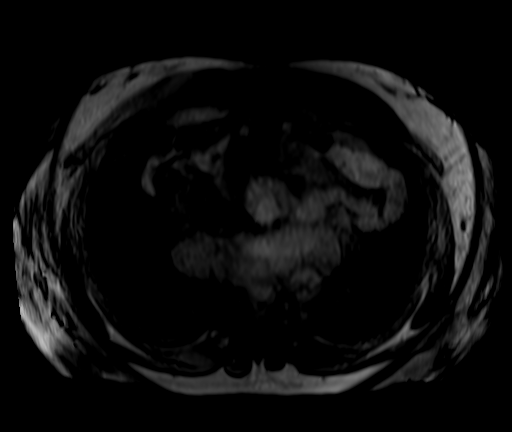

[Series 11: post 25 sec_sub · axial · 2.5mm · 0.78mm/px · z∈[-126,+112]mm · 4 of 96 slices shown]
[im 1/96]
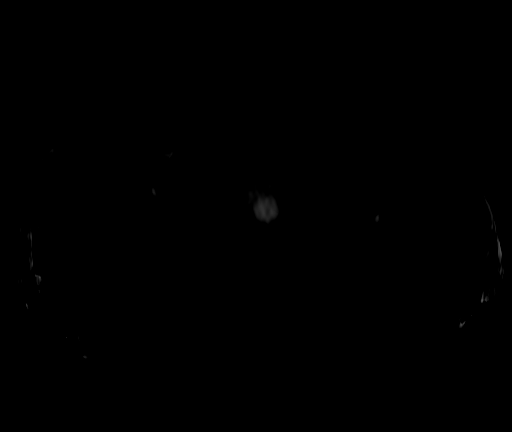
[im 32/96]
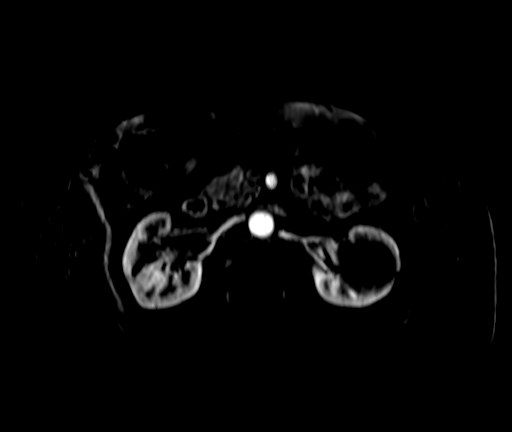
[im 64/96]
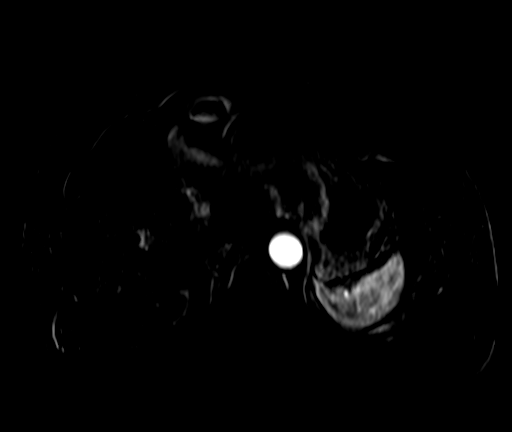
[im 96/96]
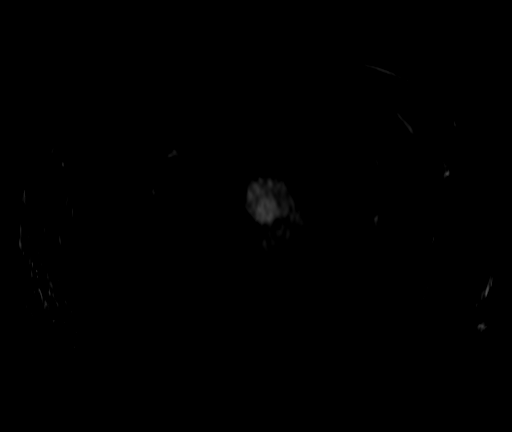

[Series 12: post 45 sec · axial · 2.5mm · 0.78mm/px · z∈[-126,+112]mm · 4 of 96 slices shown]
[im 1/96]
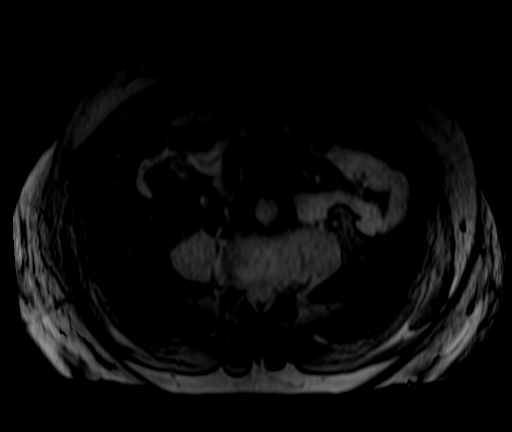
[im 32/96]
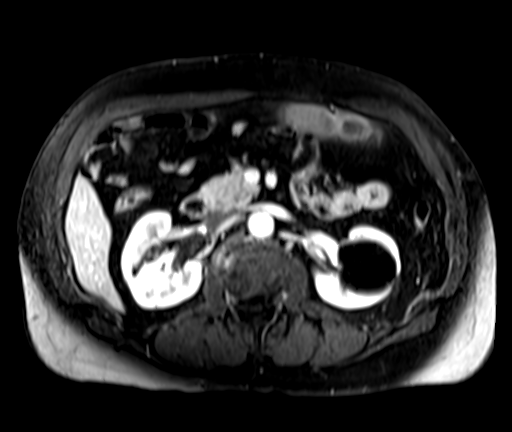
[im 64/96]
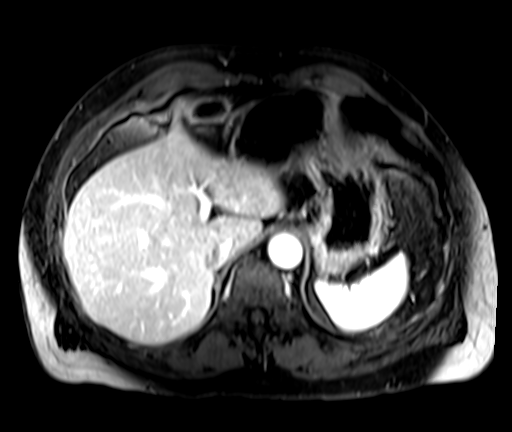
[im 96/96]
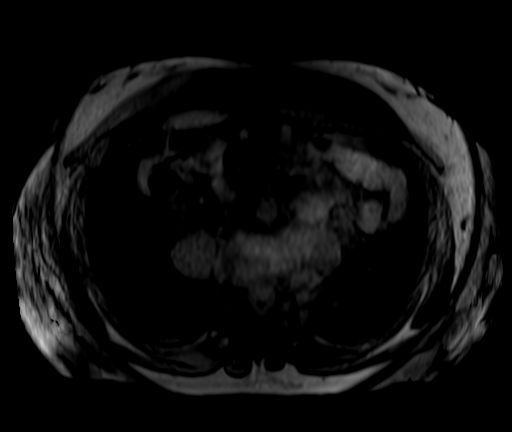

[Series 13: post 45 sec_sub · axial · 2.5mm · 0.78mm/px · z∈[-126,-48]mm · 2 of 96 slices shown]
[im 1/96]
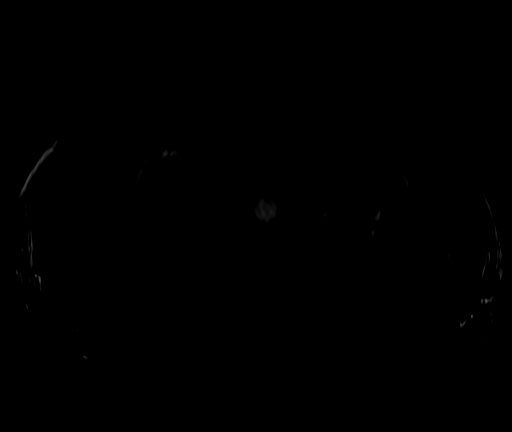
[im 32/96]
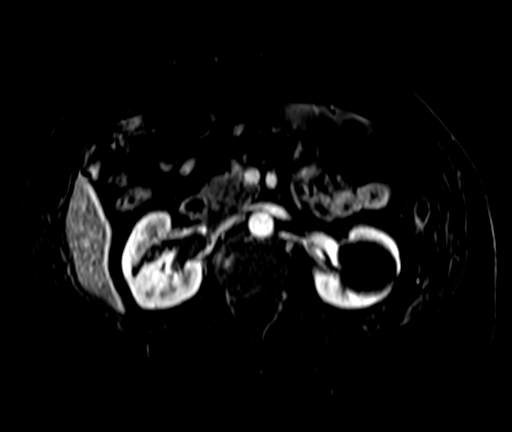

[27 of 48 positions shown; findings below may reference images not displayed]

FINDINGS: Lower chest: Normal heart size without pericardial or pleural
effusion. Left base scarring.

Hepatobiliary: Tiny right hepatic lobe cyst. No suspicious liver
lesion. Moderate hepatic steatosis. Normal gallbladder, without
biliary ductal dilatation.

Pancreas:  Normal, without mass or ductal dilatation.

Spleen:  Normal in size, without focal abnormality.

Adrenals/Urinary Tract: Normal adrenal glands. Dominant interpolar
left renal simple cyst of 5.0 cm on 68/14.

Tiny upper pole right renal cyst or cysts.

Corresponding to the CT abnormality, within the medial lower pole
right kidney, is a mass which measures 5.9 x 5.9 by 6.4 cm on [DATE]
and [DATE]. Demonstrates some areas of mild precontrast T1
hyperintensity including on series 9. After contrast, enhancement
within multiple irregular septa, including at up to 6 mm on 41/16.
Septal irregularity identified on [DATE].

Mild mass-effect with resultant mild upper pole and interpolar
caliectasis including on 30/16.

Stomach/Bowel: Normal stomach and abdominal bowel loops.

Vascular/Lymphatic: Aortic atherosclerosis. Patent renal veins. No
retroperitoneal or retrocrural adenopathy. No retroperitoneal or
retrocrural adenopathy.

Other:  No ascites.

Musculoskeletal: No right psoas involvement identified at the site
of tumor proximity. No acute osseous abnormality.
IMPRESSION: 1. Multi septated cystic right renal mass is considered Bosniak IV
(2528). Highly suspicious for cystic renal cell carcinoma. This
causes mild upper and interpolar right-sided caliectasis.
2. No evidence of abdominal metastatic disease.
3. Hepatic steatosis
4.  Aortic Atherosclerosis (ECNCI-CPG.G).

## 2023-02-23 LAB — MICROALBUMIN / CREATININE URINE RATIO
Creatinine, Urine: 83.9 mg/dL
Microalb/Creat Ratio: 52 mg/g{creat} — ABNORMAL HIGH (ref 0–29)
Microalbumin, Urine: 43.9 ug/mL

## 2023-02-25 ENCOUNTER — Other Ambulatory Visit: Payer: Self-pay

## 2023-03-07 ENCOUNTER — Other Ambulatory Visit: Payer: Self-pay

## 2023-03-23 ENCOUNTER — Other Ambulatory Visit: Payer: Self-pay

## 2023-03-28 ENCOUNTER — Ambulatory Visit: Payer: Self-pay | Attending: Internal Medicine | Admitting: Internal Medicine

## 2023-03-28 ENCOUNTER — Other Ambulatory Visit: Payer: Self-pay

## 2023-03-28 ENCOUNTER — Encounter: Payer: Self-pay | Admitting: Internal Medicine

## 2023-03-28 VITALS — BP 128/76 | HR 56 | Temp 97.9°F | Ht 75.0 in | Wt 240.0 lb

## 2023-03-28 DIAGNOSIS — L309 Dermatitis, unspecified: Secondary | ICD-10-CM | POA: Diagnosis not present

## 2023-03-28 DIAGNOSIS — M1A09X0 Idiopathic chronic gout, multiple sites, without tophus (tophi): Secondary | ICD-10-CM

## 2023-03-28 DIAGNOSIS — N401 Enlarged prostate with lower urinary tract symptoms: Secondary | ICD-10-CM

## 2023-03-28 DIAGNOSIS — Z23 Encounter for immunization: Secondary | ICD-10-CM

## 2023-03-28 DIAGNOSIS — N1832 Chronic kidney disease, stage 3b: Secondary | ICD-10-CM | POA: Insufficient documentation

## 2023-03-28 DIAGNOSIS — R310 Gross hematuria: Secondary | ICD-10-CM | POA: Diagnosis not present

## 2023-03-28 DIAGNOSIS — I129 Hypertensive chronic kidney disease with stage 1 through stage 4 chronic kidney disease, or unspecified chronic kidney disease: Secondary | ICD-10-CM | POA: Diagnosis not present

## 2023-03-28 DIAGNOSIS — R351 Nocturia: Secondary | ICD-10-CM

## 2023-03-28 DIAGNOSIS — N1831 Chronic kidney disease, stage 3a: Secondary | ICD-10-CM

## 2023-03-28 DIAGNOSIS — I480 Paroxysmal atrial fibrillation: Secondary | ICD-10-CM | POA: Diagnosis not present

## 2023-03-28 DIAGNOSIS — E7800 Pure hypercholesterolemia, unspecified: Secondary | ICD-10-CM

## 2023-03-28 DIAGNOSIS — I10 Essential (primary) hypertension: Secondary | ICD-10-CM

## 2023-03-28 DIAGNOSIS — C641 Malignant neoplasm of right kidney, except renal pelvis: Secondary | ICD-10-CM | POA: Insufficient documentation

## 2023-03-28 MED ORDER — TAMSULOSIN HCL 0.4 MG PO CAPS
0.8000 mg | ORAL_CAPSULE | Freq: Every day | ORAL | 1 refills | Status: DC
  Filled 2023-03-28 – 2023-05-04 (×2): qty 180, 90d supply, fill #0
  Filled 2023-08-02: qty 180, 90d supply, fill #1

## 2023-03-28 MED ORDER — ALLOPURINOL 300 MG PO TABS
300.0000 mg | ORAL_TABLET | Freq: Every day | ORAL | 1 refills | Status: DC
  Filled 2023-03-28 – 2023-05-26 (×2): qty 90, 90d supply, fill #0
  Filled 2023-08-28: qty 90, 90d supply, fill #1

## 2023-03-28 MED ORDER — TRIAMCINOLONE ACETONIDE 0.1 % EX CREA
1.0000 | TOPICAL_CREAM | Freq: Two times a day (BID) | CUTANEOUS | 0 refills | Status: DC
  Filled 2023-03-28: qty 30, 15d supply, fill #0

## 2023-03-28 MED ORDER — ZOSTER VAC RECOMB ADJUVANTED 50 MCG/0.5ML IM SUSR: 0.5000 mL | Freq: Once | INTRAMUSCULAR | 0 refills | Status: AC

## 2023-03-28 MED ORDER — ZOSTER VAC RECOMB ADJUVANTED 50 MCG/0.5ML IM SUSR
0.5000 mL | INTRAMUSCULAR | 0 refills | Status: DC
  Filled 2023-03-28: qty 1, 1d supply, fill #0

## 2023-03-28 NOTE — Progress Notes (Signed)
 Patient ID: Albert Good., male    DOB: 1955/12/20  MRN: [id number]  CC: Hypertension (HTN f/u. Med refill. /No questions / concerns/Yes to shingles vax)   Subjective: Albert Good is a 68 y.o. male who presents for chronic ds management. 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  Neg), aortic atherosclerosis on CT chest 09/2020    HYPERTENSION/PAF Currently taking: see medication list -Hydralazine 25 mg TID, Toprol 100 mg daily and Cardizem 300 mg daily.  Also on Eliquis twice a day and flecainide. Med Adherence: [x]  Yes    []  No Medication side effects: []  Yes    [x]  No Adherence with salt restriction: [x]  Yes    []  No Home Monitoring?: [x]  Yes    []  No Monitoring Frequency: 2x/wk Home BP results range: 152/99, 151/94, 129/97, 120/80.  Reports blood pressure is higher when he takes it before taking his medications. SOB? [x]  Yes sometimes with activity since last wk Chest Pain?: [x]  Yes    []  No Leg swelling?: [x]  Yes    []  No Headaches?: [x]  Yes    []  No Dizziness? [x]  Yes, occasionally    []  No Comments: no palpitations.  No bruising on Eliquis HL: taking Lipitor consistently OSA:  using his CPAP every night as long as he is home Hx of Renal CA:  some some blood in urine a few mths ago that lasted a few days. Saw his urologist Dr. Laverle Patter at [facility] last mth and told him about it.  States he was told to watch it for now.  Will do cystoscopy if it recurs.  Doing well on Flomax.  Needs refills.  CKD 3: saw [facility] last mth.  GFR stable in upper 40's. Gout:  on Allopurinol  daily.  No recent flares   Patient Active Problem List   Diagnosis Date Noted   Malignant neoplasm of right kidney, except renal pelvis (HCC) 03/28/2023   Stage 3b chronic kidney disease (HCC) 03/28/2023   Benign neoplasm of ascending colon 04/13/2022   Hypertensive retinopathy 03/03/2022   Low testosterone in male 01/27/2022   Neoplasm of right kidney 12/22/2020   Aortic atherosclerosis (HCC) 11/27/2020   Renal mass 11/27/2020   Need for tetanus, diphtheria, and acellular pertussis (Tdap) vaccine 05/23/2020   Overweight (BMI 25.0-29.9) 01/24/2020   OSA (obstructive sleep apnea) 01/24/2020   History of adenomatous polyp of colon 01/24/2020   Chronic anticoagulation 03/28/2019   Paroxysmal atrial fibrillation (HCC) 03/12/2018   Essential hypertension 03/12/2018   BPH associated with nocturia 03/12/2018   Chronic gout of multiple sites 03/12/2018   Long-term use of aspirin therapy 03/12/2018   Chest pain 03/27/2017     Current Outpatient Medications on File Prior to Visit  Medication Sig Dispense Refill   apixaban (ELIQUIS) 5 MG TABS tablet Take 1 tablet (5 mg total) by mouth 2 (two) times daily. 180 tablet 3   atorvastatin (LIPITOR) 10 MG tablet Take 1 tablet (10 mg total) by mouth once daily. 90 tablet 3   colchicine 0.6 MG tablet Take 1 tablet (0.6 mg total) by mouth as needed for 3-4 days only during acute gout attack. 15  tablet 1   diltiazem (CARDIZEM CD) 300 MG 24 hr capsule Take 1 capsule (300 mg total) by mouth daily. 90 capsule 3   flecainide (TAMBOCOR) 50 MG tablet Take 1 tablet (50 mg total) by mouth daily. 90 tablet 3   hydrALAZINE (APRESOLINE) 50 MG tablet Take 1 tablet (50 mg total) by mouth 3 (three) times daily. 270 tablet 3   metoprolol succinate (TOPROL-XL) 100 MG 24 hr tablet Take 1 tablet (100 mg total) by mouth daily. Take with or immediately following a meal. 90 tablet 3   No current facility-administered medications on file prior to visit.    Allergies   Allergen Reactions   Other Hives    HAIR DYE  Lactose intolerant    Social History   Socioeconomic History   Marital status: Single    Spouse name: Not on file   Number of children: 10   Years of education: Not on file   Highest education level: Not on file  Occupational History   Occupation: flagger  Tobacco Use   Smoking status: Former    Types: Cigarettes   Smokeless tobacco: Former    Types: Snuff    Quit date: 11/13/2020  Vaping Use   Vaping status: Never Used  Substance and Sexual Activity   Alcohol use: Yes    Comment: 2 every other day   Drug use: Not Currently   Sexual activity: Yes    Birth control/protection: None  Other Topics Concern   Not on file  Social History Narrative   Not on file   Social Drivers of Health   Financial Resource Strain: Medium Risk (11/25/2022)   Overall Financial Resource Strain (CARDIA)    Difficulty of Paying Living Expenses: Somewhat hard  Food Insecurity: No Food Insecurity (11/25/2022)   Hunger Vital Sign    Worried About Running Out of Food in the Last Year: Never true    Ran Out of Food in the Last Year: Never true  Transportation Needs: No Transportation Needs (11/25/2022)   PRAPARE - Administrator, Civil Service (Medical): No    Lack of Transportation (Non-Medical): No  Physical Activity: Sufficiently Active (11/25/2022)   Exercise Vital Sign    Days of Exercise per Week: 6 days    Minutes of Exercise per Session: 30 min  Stress: No Stress Concern Present (11/25/2022)   [employer] of Occupational Health - Occupational Stress Questionnaire    Feeling of Stress : Not at all  Social Connections: Socially Isolated (11/25/2022)   Social Connection and Isolation Panel [NHANES]    Frequency of Communication with Friends and Family: Never    Frequency of Social Gatherings with Friends and Family: Never    Attends Religious Services: Never    Database administrator or Organizations: No    Attends  Banker Meetings: Never    Marital Status: Never married  Intimate Partner Violence: Not At Risk (11/25/2022)   Humiliation, Afraid, Rape, and Kick questionnaire    Fear of Current or Ex-Partner: No    Emotionally Abused: No    Physically Abused: No    Sexually Abused: No    Family History  Problem Relation Age of Onset   Diabetes Mother    Hypertension Father    Diabetes Sister    Diabetes Brother    Heart failure Brother    Kidney failure Brother    Colon cancer Neg Hx    Stomach cancer Neg Hx    Esophageal cancer  Neg Hx     Past Surgical History:  Procedure Laterality Date   COLONOSCOPY WITH PROPOFOL N/A 04/13/2022   Procedure: COLONOSCOPY WITH PROPOFOL;  Surgeon: Sherrilyn Rist, MD;  Location: [facility];  Service: Gastroenterology;  Laterality: N/A;   LIPOSUCTION     POLYPECTOMY  04/13/2022   Procedure: POLYPECTOMY;  Surgeon: Sherrilyn Rist, MD;  Location: [facility];  Service: Gastroenterology;;   ROBOTIC ASSITED PARTIAL NEPHRECTOMY Right 12/22/2020   Procedure: XI ROBOTIC ASSITED  NEPHRECTOMY;  Surgeon: Heloise Purpura, MD;  Location: [facility];  Service: Urology;  Laterality: Right;    ROS: Review of Systems Negative except as stated above  PHYSICAL EXAM: BP 128/76   Pulse (!) 56   Temp 97.9 F (36.6 C) (Oral)   Ht 6\' 3"  (1.905 m)   Wt 240 lb (108.9 kg)   SpO2 98%   BMI 30.00 kg/m   Physical Exam   General appearance - alert, well appearing, and in no distress Mental status - normal mood, behavior, speech, dress, motor activity, and thought processes Neck - supple, no significant adenopathy Chest - clear to auscultation, no wheezes, rales or rhonchi, symmetric air entry Heart - normal rate, rhythm is irregular, normal S1, S2, no murmurs, rubs, clicks or gallops Extremities - peripheral pulses normal, no pedal edema, no clubbing or cyanosis     Latest Ref Rng & Units 11/25/2022    1:31 PM 01/29/2022    3:49 PM 12/03/2021    3:20 PM   CMP  Glucose 70 - 99 mg/dL 93  75  161   BUN 8 - 27 mg/dL 17  17  18    Creatinine 0.76 - 1.27 mg/dL 0.96  0.45  4.09   Sodium 134 - 144 mmol/L 145  138  139   Potassium 3.5 - 5.2 mmol/L 4.3  4.6  3.8   Chloride 96 - 106 mmol/L 106  102  107   CO2 20 - 29 mmol/L 21  22  24    Calcium 8.6 - 10.2 mg/dL 9.1  9.2  8.6   Total Protein 6.0 - 8.5 g/dL 6.9     Total Bilirubin 0.0 - 1.2 mg/dL 0.4     Alkaline Phos 44 - 121 IU/L 95     AST 0 - 40 IU/L 23     ALT 0 - 44 IU/L 17      Lipid Panel     Component Value Date/Time   CHOL 142 11/25/2022 1331   TRIG 89 11/25/2022 1331   HDL 69 11/25/2022 1331   CHOLHDL 2.1 11/25/2022 1331   LDLCALC 56 11/25/2022 1331    CBC    Component Value Date/Time   WBC 5.2 11/25/2022 1331   WBC 5.2 12/03/2021 1409   RBC 5.00 11/25/2022 1331   RBC 4.91 12/03/2021 1409   HGB 13.8 11/25/2022 1331   HCT 43.9 11/25/2022 1331   PLT 213 11/25/2022 1331   MCV 88 11/25/2022 1331   MCH 27.6 11/25/2022 1331   MCH 28.5 12/03/2021 1409   MCHC 31.4 (L) 11/25/2022 1331   MCHC 32.5 12/03/2021 1409   RDW 14.3 11/25/2022 1331   LYMPHSABS 2.9 09/28/2018 1000   EOSABS 0.2 09/28/2018 1000   BASOSABS 0.1 09/28/2018 1000    ASSESSMENT AND PLAN: 1. Essential hypertension (Primary) At goal.  Continue hydralazine 50 mg 3 times a day, metoprolol 100 mg daily and Cardizem 300 mg daily  2. Pure hypercholesterolemia Continue atorvastatin 10 mg daily.  3. Stage  3a chronic kidney disease (HCC) Stable GFR in the upper 40s.  He established with [facility] last month.  Avoid NSAIDs.  4. Paroxysmal atrial fibrillation (HCC) He is on Eliquis, metoprolol, flecainide and diltiazem  5. Chronic gout of multiple sites, unspecified cause - allopurinol (ZYLOPRIM) 300 MG tablet; Take 1 tablet (300 mg total) by mouth daily.  Dispense: 90 tablet; Refill: 1  6. Gross hematuria Patient on Eliquis.  Also has history of renal cell CA.  Followed by urology.  7.  Dermatitis No rash at this time but needs RF on Triamcinolone cream - triamcinolone cream (KENALOG) 0.1 %; Apply 1 Application topically 2 (two) times daily.  Dispense: 30 g; Refill: 0  8. BPH associated with nocturia - tamsulosin (FLOMAX) 0.4 MG CAPS capsule; Take 2 capsules (0.8 mg total) by mouth at bedtime.  Dispense: 180 capsule; Refill: 1  9. Need for shingles vaccine Patient agreeable to receiving Shingrix vaccine.  Prescription printed and given to him to take to the pharmacy.     Patient was given the opportunity to ask questions.  Patient verbalized understanding of the plan and was able to repeat key elements of the plan.   This documentation was completed using Paediatric nurse.  Any transcriptional errors are unintentional.  No orders of the defined types were placed in this encounter.    Requested Prescriptions   Signed Prescriptions Disp Refills   allopurinol (ZYLOPRIM) 300 MG tablet 90 tablet 1    Sig: Take 1 tablet (300 mg total) by mouth daily.   triamcinolone cream (KENALOG) 0.1 % 30 g 0    Sig: Apply 1 Application topically 2 (two) times daily.   tamsulosin (FLOMAX) 0.4 MG CAPS capsule 180 capsule 1    Sig: Take 2 capsules (0.8 mg total) by mouth at bedtime.   Zoster Vaccine Adjuvanted [facility]) injection 0.5 mL 0    Sig: Inject 0.5 mLs into the muscle once for 1 dose.    Return in about 4 months (around 07/28/2023).  Jonah Blue, MD, FACP

## 2023-04-01 ENCOUNTER — Telehealth: Payer: Self-pay

## 2023-04-01 ENCOUNTER — Other Ambulatory Visit: Payer: Self-pay

## 2023-04-01 NOTE — Telephone Encounter (Signed)
 Submitted application for ELIQUIS to [employer]) for patient assistance.   Phone: [phone]

## 2023-04-07 ENCOUNTER — Other Ambulatory Visit: Payer: Self-pay

## 2023-04-08 ENCOUNTER — Other Ambulatory Visit: Payer: Self-pay

## 2023-04-08 ENCOUNTER — Telehealth: Payer: Self-pay | Admitting: Internal Medicine

## 2023-04-08 ENCOUNTER — Telehealth: Payer: Self-pay

## 2023-04-08 NOTE — Telephone Encounter (Signed)
 Received notification from [employer] [facility] [employer]) regarding patient assistance DENIAL for [employer].   Phone:[phone]  PATIENT IS AWARE.  Offered BMS # to patient, patient refused. Relayed 3% oop documentation need per program and Medicare D payment plan information for deductible. Patient indicated he wants to stop medication. Message sent to MD.

## 2023-04-08 NOTE — Telephone Encounter (Signed)
 Copied from CRM [id number]. Topic: Clinical - Medication Question >> Apr 08, 2023  8:52 AM Patsy Lager T wrote: Reason for CRM: patient called said something with her medication apixaban (ELIQUIS) 5 MG TABS tablet and patient needs to see if there is an alternate medication he can be prescribed as he only has enough meds to last til Wednesday. Please f/u with patient

## 2023-04-08 NOTE — Telephone Encounter (Signed)
 Called & clarified with the patient. Verified name & DOB. Clarified of patient need. Patient stated that he needs an alternative medication to his apixaban (Eliquis) due to cost. He stated that he currently cannot afford this medication. Please advise.

## 2023-04-09 NOTE — Telephone Encounter (Signed)
 Albert Good can you help problem solve this? Not sure if he has a deductible he has to meet or whether Xarelto would be preferred on his insurance.  Out of office next week. [place] will be covering.

## 2023-04-11 ENCOUNTER — Telehealth: Payer: Self-pay | Admitting: Internal Medicine

## 2023-04-11 NOTE — Telephone Encounter (Signed)
 Hey friend,   Checked with his insurance and Xarelto and Eliquis are equally tiered. Therefore, this is likely an issue of an unmet deductible. Would we be able to apply for MAP with either of these? If so, is there one over another that is easier application-wise?

## 2023-04-11 NOTE — Telephone Encounter (Signed)
 Franky Macho I saw your message that you tried reaching pt today. I tried reaching him over the wkend and did not get him. Spoke with him this evening.  I told him that he likely has a high deductible for the Eliquis and the same applies for Xarelto. Advise that he checks with his insurance since he has not done so as yet.   Advise against stopping blood thinner.  He wanted to know if ASA would give sufficient coverage for PAF and I told him it would not. Advise starting Coumadin if unable to afford DOAG. He told me he was on Coumadin in the past. Micah Flesher over with him how Coumadin is different from [facility] and will require closer monitoring and has a lot more drug interactions. He said he will speak with his insurance and cardiologist tomorrow and get back to [place] on his decision about starting Coumadin instead.  I told him to touch base with you or [place].

## 2023-04-14 ENCOUNTER — Other Ambulatory Visit: Payer: Self-pay

## 2023-04-14 NOTE — Telephone Encounter (Signed)
 Called & spoke to the patient. Verified name & DOB. Inquired if he is wiling to start the process for patient assistance for either Xarelto or Eliquis. Informed him that the pharmacy is able to print an expense report for him to help assist the process. Patient stated that he is willing to do whatever necessary to start the process. Please reach out to the patient to assist.

## 2023-04-14 NOTE — Telephone Encounter (Signed)
 Hey all -   Sorry to butt in here. I just wanted to provide some clarification and let everyone know I spoke to Mr/ [facility].   Bottom line: his out of pocket expenses do not qualify him for patient assistance for a DOAC. Therefore, if he does not call his insurance to set up a payment plan to meet his deductible, there is no way for [place] to get his Eliquis copay to go down. The copay deductible plan is new to Medicare this year, and a lot of patients have not been educated on this.   I spoke to the patient today, 04/14/2023 at 12:25p. Introduced myself and the reason for my call. I emphasized that he does not qualify for free medication from the manufacturer. Before we change his therapy, I think it is worth it for him to call insurance and set up his prescription drug payment plan. If this lowers the price for Eliquis, it can save [place] from having to change to warfarin. Once I discussed this with the patient, he verbalized understanding. He told me he is going to call his insurance to set up his payment plan. Once he does this he is going to call our pharmacy to let them know.   If the payment plan is still cost prohibitive, he will let us know. I have sent a staff message with this information to his Cardiology team so they are aware. Cc'd Kelly from pharmacy and Dr. Alvis Lemmings, who is covering for pt's PCP this week.

## 2023-04-14 NOTE — Telephone Encounter (Signed)
 Thank you for the update [facility].

## 2023-04-15 ENCOUNTER — Other Ambulatory Visit: Payer: Self-pay

## 2023-04-18 ENCOUNTER — Other Ambulatory Visit: Payer: Self-pay

## 2023-04-19 ENCOUNTER — Other Ambulatory Visit: Payer: Self-pay

## 2023-04-22 ENCOUNTER — Other Ambulatory Visit: Payer: Self-pay

## 2023-04-22 NOTE — Progress Notes (Signed)
 S:     No chief complaint on file.  68 y.o. male who presents for hypertension evaluation, education, and management. 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 > Negative), aortic atherosclerosis on CT chest 09/2020.   Patient was referred by Primary Care Provider, Dr. Lincoln Renshaw, on 11/25/2022. BP at that visit was 150/84. Seen by pharmacy on 12/24/22 and  BP was 146/80 - hydralazine was increased to 25 mg PO TID. At appt on 01/24/23, BP was 150/93 and hydralazine was increased to 50 mg PO TID. At pharmacy follow-up on 02/21/23, BP was close to goal and current regimen was continued. Patient was instructed to increase frequency of home monitoring. BP at PCP visit on 03/28/23 was controlled at 128/76 mmHg. Of note, patient has not been able to afford his Eliquis due to an unmet deductible. He was denied for BMS PAP (not meeting requirement for OOP spend). He was instructed to contact his insurance to set up a payment plan.  Today, patient arrives in good spirits and presents without assistance. Denies dizziness, headache, blurred vision, swelling. He continues to be adherent to hydralazine 50mg  TID and is tolerating this well. Has taken his BP medication this morning. Of note, he also informs me that he called his [employer] plan and set up his deductible/copay payment plan for Eliquis. Copay last month was $0 for a 30-day fill.   Family/Social history:  -Fhx: DM, HTN -Tobacco: former smoker (quit in 2022) -Alcohol: none reported   Medication adherence reported. Patient has taken BP medications today.  Current antihypertensives include: diltiazem CD 300 mg  daily (AM), hydralazine 50 mg TID, Toprol-XL 100 mg daily(AM)   Antihypertensives tried in the past include: irbesartan (AKI), valsartan (backorder at the time)  Reported home BP readings: has been checking at home: 1 reading above at 159/85 since last visit. This was when he checked his BP before taking his medication.   Patient reported dietary habits:  -Compliant with sodium restriction  -Denies caffeine intake  -No changes since last visit.   Patient-reported exercise habits: none reported. No changes since last visit.   O:  Vitals:   04/25/23 1122  BP: 137/78  Pulse: (!) 55     Last 3 Office BP readings: BP Readings from Last 3 Encounters:  04/25/23 137/78  03/28/23 128/76  02/21/23 138/83   UACR 02/21/23: 52 mg/g  BMET    Component Value Date/Time   NA 145 (H) 11/25/2022 1331   K 4.3 11/25/2022 1331   CL 106 11/25/2022 1331   CO2 21 11/25/2022 1331   GLUCOSE 93 11/25/2022 1331   GLUCOSE 115 (H) 12/03/2021 1520   BUN 17 11/25/2022 1331   CREATININE 1.56 (H) 11/25/2022 1331   CALCIUM 9.1 11/25/2022 1331   GFRNONAA 34 (L) 12/03/2021 1520   GFRAA 69 08/30/2019 0943   Last eGFR estimated 48 mL/min  Renal function: CrCl cannot be calculated (Patient's most recent lab result is older than the maximum 21 days allowed.).  Clinical ASCVD: aortic atherosclerosis noted on CT scan 2022 The 10-year ASCVD risk score (Arnett DK, et al., 2019) is: 17.2%   Values used to calculate the score:     Age: 68  years     Sex: Male     Is Non-Hispanic African American: Yes     Diabetic: No     Tobacco smoker: No     Systolic Blood Pressure: 137 mmHg     Is BP treated: Yes     HDL Cholesterol: 69 mg/dL     Total Cholesterol: 142 mg/dL   A/P: Hypertension longstanding and close to goal <130/80 mmHg on current medications. Home readings at goal, while clinic BP remains slightly elevated. Medication adherence appears optimal. Pt is not a candidate for other first line therapies  including DHP CCB given current treatment with diltiazem, ARB due to hx of AKI on irbesartan, and thiazide due to chronic gout. If home readings are elevated at follow-up, can consider transitioning from metoprolol succinate to carvedilol vs increasing hydralazine to 75 mg PO TID. Emphasized importance of adherence to BP medications.  -Continued hydralazine 50mg  TID.  -Continue diltiazem and metoprolol at current doses.  -Counseled on lifestyle modifications for blood pressure control including reduced dietary sodium, staying hydrated ,increased exercise, adequate sleep. Patient is currently being worked up for OSA.  -Encouraged patient to check BP at home at least 2-3 times per week and bring log of readings to next visit. Counseled on proper use of home BP cuff.   Results reviewed and written information provided.    Written patient instructions provided. Patient verbalized understanding of treatment plan.  Total time in face to face counseling 20 minutes.    Follow-up:  PCP 07/28/23 Pharmacist prn.  Marene Shape, PharmD, Becky Bowels, CPP Clinical Pharmacist [facility] [phone]

## 2023-04-25 ENCOUNTER — Other Ambulatory Visit: Payer: Self-pay

## 2023-04-25 ENCOUNTER — Ambulatory Visit: Payer: Medicare HMO | Attending: Internal Medicine | Admitting: Pharmacist

## 2023-04-25 ENCOUNTER — Encounter: Payer: Self-pay | Admitting: Pharmacist

## 2023-04-25 VITALS — BP 137/78 | HR 55

## 2023-04-25 DIAGNOSIS — I10 Essential (primary) hypertension: Secondary | ICD-10-CM

## 2023-04-27 ENCOUNTER — Other Ambulatory Visit: Payer: Self-pay

## 2023-04-28 ENCOUNTER — Other Ambulatory Visit: Payer: Self-pay

## 2023-04-29 ENCOUNTER — Other Ambulatory Visit: Payer: Self-pay

## 2023-04-29 DIAGNOSIS — G4733 Obstructive sleep apnea (adult) (pediatric): Secondary | ICD-10-CM | POA: Diagnosis not present

## 2023-05-02 ENCOUNTER — Other Ambulatory Visit: Payer: Self-pay

## 2023-05-04 ENCOUNTER — Other Ambulatory Visit: Payer: Self-pay

## 2023-05-10 ENCOUNTER — Other Ambulatory Visit: Payer: Self-pay

## 2023-05-11 ENCOUNTER — Other Ambulatory Visit: Payer: Self-pay

## 2023-05-17 ENCOUNTER — Ambulatory Visit: Attending: Internal Medicine

## 2023-05-17 VITALS — Ht 75.0 in | Wt 225.0 lb

## 2023-05-17 DIAGNOSIS — Z0000 Encounter for general adult medical examination without abnormal findings: Secondary | ICD-10-CM

## 2023-05-17 DIAGNOSIS — Z1159 Encounter for screening for other viral diseases: Secondary | ICD-10-CM

## 2023-05-17 NOTE — Progress Notes (Signed)
 Because this visit was a virtual/telehealth visit,  certain criteria was not obtained, such a blood pressure, CBG if applicable, and timed get up and go. Any medications not marked as "taking" were not mentioned during the medication reconciliation part of the visit. Any vitals not documented were not able to be obtained due to this being a telehealth visit or patient was unable to self-report a recent blood pressure reading due to a lack of equipment at home via telehealth. Vitals that have been documented are verbally provided by the patient.   Subjective:   Albert Litterer. is a 68 y.o. who presents for a Medicare Wellness preventive visit.  Visit Complete: Virtual I connected with  Albert Good. on 05/17/23 by a audio enabled telemedicine application and verified that I am speaking with the correct person using two identifiers.  Patient Location: Other:  WORK  Provider Location: Office/Clinic  I discussed the limitations of evaluation and management by telemedicine. The patient expressed understanding and agreed to proceed.  Vital Signs: Because this visit was a virtual/telehealth visit, some criteria may be missing or patient reported. Any vitals not documented were not able to be obtained and vitals that have been documented are patient reported.  VideoDeclined- This patient declined Librarian, academic. Therefore the visit was completed with audio only.  Persons Participating in Visit: Patient.  AWV Questionnaire: No: Patient Medicare AWV questionnaire was not completed prior to this visit.  Cardiac Risk Factors include: advanced age (>67men, >78 women);family history of premature cardiovascular disease;hypertension;male gender     Objective:    Today's Vitals   05/17/23 1334  Weight: 225 lb (102.1 kg)  Height: 6\' 3"  (1.905 m)  PainSc: 0-No pain   Body mass index is 28.12 kg/m.     05/17/2023    1:35 PM 04/13/2022   10:10 AM 01/01/2022    11:06 AM 12/22/2020    5:46 AM 11/27/2020    8:16 AM 08/04/2020    8:51 AM 01/19/2020    8:18 PM  Advanced Directives  Does Patient Have a Medical Advance Directive? No No No No No No No  Would patient like information on creating a medical advance directive? No - Patient declined No - Patient declined Yes (ED - Information included in AVS) No - Patient declined No - Patient declined No - Patient declined No - Patient declined    Current Medications (verified) Outpatient Encounter Medications as of 05/17/2023  Medication Sig   allopurinol  (ZYLOPRIM ) 300 MG tablet Take 1 tablet (300 mg total) by mouth daily.   apixaban  (ELIQUIS ) 5 MG TABS tablet Take 1 tablet (5 mg total) by mouth 2 (two) times daily.   atorvastatin  (LIPITOR) 10 MG tablet Take 1 tablet (10 mg total) by mouth once daily.   colchicine  0.6 MG tablet Take 1 tablet (0.6 mg total) by mouth as needed for 3-4 days only during acute gout attack.   diltiazem  (CARDIZEM  CD) 300 MG 24 hr capsule Take 1 capsule (300 mg total) by mouth daily.   flecainide  (TAMBOCOR ) 50 MG tablet Take 1 tablet (50 mg total) by mouth daily.   hydrALAZINE  (APRESOLINE ) 50 MG tablet Take 1 tablet (50 mg total) by mouth 3 (three) times daily.   metoprolol  succinate (TOPROL -XL) 100 MG 24 hr tablet Take 1 tablet (100 mg total) by mouth daily. Take with or immediately following a meal.   tamsulosin  (FLOMAX ) 0.4 MG CAPS capsule Take 2 capsules (0.8 mg total) by mouth at bedtime.  triamcinolone  cream (KENALOG ) 0.1 % Apply 1 Application topically 2 (two) times daily.   Zoster Vaccine Adjuvanted (SHINGRIX ) injection Inject 0.5 mLs into the muscle.   No facility-administered encounter medications on file as of 05/17/2023.    Allergies (verified) Other   History: Past Medical History:  Diagnosis Date   BPH associated with nocturia 03/12/2018   BPH with obstruction/lower urinary tract symptoms    Chronic gout of multiple sites 03/12/2018   Dysrhythmia    Afib    Essential hypertension    Gout    Long-term use of aspirin  therapy 03/12/2018   OSA (obstructive sleep apnea)    uses Cpap   Paroxysmal atrial fibrillation [facility])    Past Surgical History:  Procedure Laterality Date   COLONOSCOPY WITH PROPOFOL  N/A 04/13/2022   Procedure: COLONOSCOPY WITH PROPOFOL ;  Surgeon: Albertina Hugger, MD;  Location: [facility] ENDOSCOPY;  Service: Gastroenterology;  Laterality: N/A;   LIPOSUCTION     POLYPECTOMY  04/13/2022   Procedure: POLYPECTOMY;  Surgeon: Albertina Hugger, MD;  Location: [facility];  Service: Gastroenterology;;   ROBOTIC ASSITED PARTIAL NEPHRECTOMY Right 12/22/2020   Procedure: XI ROBOTIC ASSITED  NEPHRECTOMY;  Surgeon: Florencio Hunting, MD;  Location: [facility] ORS;  Service: Urology;  Laterality: Right;   Family History  Problem Relation Age of Onset   Diabetes Mother    Hypertension Father    Diabetes Sister    Diabetes Brother    Heart failure Brother    Kidney failure Brother    Colon cancer Neg Hx    Stomach cancer Neg Hx    Esophageal cancer Neg Hx    Social History   Socioeconomic History   Marital status: Single    Spouse name: Not on file   Number of children: 10   Years of education: Not on file   Highest education level: Not on file  Occupational History   Occupation: flagger  Tobacco Use   Smoking status: Former    Types: Cigarettes   Smokeless tobacco: Former    Types: Snuff    Quit date: 11/13/2020  Vaping Use   Vaping status: Never Used  Substance and Sexual Activity   Alcohol use: Yes    Comment: 2 every other day   Drug use: Not Currently   Sexual activity: Yes    Birth control/protection: None  Other Topics Concern   Not on file  Social History Narrative   Not on file   Social Drivers of Health   Financial Resource Strain: Low Risk  (05/17/2023)   Overall Financial Resource Strain (CARDIA)    Difficulty of Paying Living Expenses: Not very hard  Food Insecurity: No Food Insecurity (05/17/2023)   Hunger Vital  Sign    Worried About Running Out of Food in the Last Year: Never true    Ran Out of Food in the Last Year: Never true  Transportation Needs: No Transportation Needs (05/17/2023)   PRAPARE - Administrator, Civil Service (Medical): No    Lack of Transportation (Non-Medical): No  Physical Activity: Sufficiently Active (05/17/2023)   Exercise Vital Sign    Days of Exercise per Week: 6 days    Minutes of Exercise per Session: 30 min  Stress: No Stress Concern Present (05/17/2023)   [employer] of Occupational Health - Occupational Stress Questionnaire    Feeling of Stress : Not at all  Social Connections: Socially Isolated (05/17/2023)   Social Connection and Isolation Panel [NHANES]    Frequency  of Communication with Friends and Family: Never    Frequency of Social Gatherings with Friends and Family: Never    Attends Religious Services: Never    Database administrator or Organizations: No    Attends Engineer, structural: Never    Marital Status: Never married    Tobacco Counseling Counseling given: Not Answered    Clinical Intake:  Pre-visit preparation completed: Yes  Pain : No/denies pain Pain Score: 0-No pain     BMI - recorded: 28.12 Nutritional Status: BMI 25 -29 Overweight Nutritional Risks: None Diabetes: No  No results found for: "HGBA1C"   How often do you need to have someone help you when you read instructions, pamphlets, or other written materials from your doctor or pharmacy?: 1 - Never  Interpreter Needed?: No  Information entered by :: Ita Fritzsche N. Kamiya Acord, LPN.   Activities of Daily Living     05/17/2023    1:38 PM 08/10/2022   11:26 AM  In your present state of health, do you have any difficulty performing the following activities:  Hearing? 0 0  Vision? 0 0  Difficulty concentrating or making decisions? 0 0  Walking or climbing stairs? 0 0  Dressing or bathing? 0 0  Doing errands, shopping? 0 0  Preparing Food and eating ?  N N  Using the Toilet? N N  In the past six months, have you accidently leaked urine? N N  Do you have problems with loss of bowel control? N N  Managing your Medications? N N  Managing your Finances? N N  Housekeeping or managing your Housekeeping? N N    Patient Care Team: Lawrance Presume, MD as PCP - General (Internal Medicine) Elmyra Haggard, MD as PCP - Cardiology (Cardiology) Jacqueline Matsu, MD as PCP - Sleep Medicine (Cardiology) Maris Sickle, MD as Referring Physician (Ophthalmology)  Indicate any recent Medical Services you may have received from other than [facility] providers in the past year (date may be approximate).     Assessment:   This is a routine wellness examination for Albert Good.  Hearing/Vision screen Hearing Screening - Comments:: Denies hearing difficulties.  Vision Screening - Comments:: Wears rx glasses -  up to date with routine eye exams with Maris Sickle, MD.    Goals Addressed             This Visit's Progress    Patient Stated       05/17/2023: To maintain my health and stay independent.       Depression Screen     05/17/2023    1:38 PM 03/28/2023   11:34 AM 11/25/2022   11:44 AM 08/10/2022   11:29 AM 01/29/2022    2:32 PM 01/29/2022    2:31 PM 11/30/2021   10:22 AM  PHQ 2/9 Scores  PHQ - 2 Score 0 0 1 0 0 0 0  PHQ- 9 Score 0 2 4  0 3 3    Fall Risk     05/17/2023    1:37 PM 01/01/2022   11:06 AM 11/30/2021   10:17 AM 07/27/2021    1:41 PM 11/27/2020   10:08 AM  Fall Risk   Falls in the past year? 0 0 0 0 0  Number falls in past yr: 0 0 0 0 0  Injury with Fall? 0 0 0 0 0  Risk for fall due to : No Fall Risks No Fall Risks No Fall Risks No Fall Risks No Fall Risks  Follow up Falls prevention discussed;Falls evaluation completed        MEDICARE RISK AT HOME:  Medicare Risk at Home Any stairs in or around the home?: No If so, are there any without handrails?: No Home free of loose throw rugs in walkways, pet beds, electrical  cords, etc?: Yes Adequate lighting in your home to reduce risk of falls?: Yes Life alert?: No Use of a cane, walker or w/c?: No Grab bars in the bathroom?: No Shower chair or bench in shower?: No Elevated toilet seat or a handicapped toilet?: Yes  TIMED UP AND GO:  Was the test performed?  No  Cognitive Function: 6CIT completed    05/17/2023    1:38 PM 01/01/2022   11:08 AM  MMSE - Mini Mental State Exam  Not completed: Unable to complete   Orientation to time  5  Orientation to Place  5  Registration  3  Attention/ Calculation  5  Recall  3  Language- name 2 objects  2  Language- repeat  1  Language- follow 3 step command  3  Language- read & follow direction  1  Write a sentence  1  Copy design  1  Total score  30        05/17/2023    1:37 PM 01/01/2022   11:10 AM  6CIT Screen  What Year? 0 points 0 points  What month? 0 points 0 points  What time? 0 points 0 points  Count back from 20 0 points 0 points  Months in reverse 0 points 0 points  Repeat phrase 0 points 0 points  Total Score 0 points 0 points    Immunizations Immunization History  Administered Date(s) Administered   Fluad Quad(high Dose 65+) 11/30/2021   Fluad Trivalent(High Dose 65+) 12/08/2022   Influenza,inj,Quad PF,6+ Mos 09/28/2018, 01/24/2020   PFIZER(Purple Top)SARS-COV-2 Vaccination 03/23/2019, 04/23/2019   PNEUMOCOCCAL CONJUGATE-20 01/05/2022   Pneumococcal Conjugate-13 01/24/2020   Tdap 05/23/2020   Zoster Recombinant(Shingrix ) 03/28/2023    Screening Tests Health Maintenance  Topic Date Due   Hepatitis C Screening  Never done   COVID-19 Vaccine (3 - Pfizer risk series) 12/11/2023 (Originally 05/21/2019)   Zoster Vaccines- Shingrix  (2 of 2) 05/23/2023   INFLUENZA VACCINE  08/12/2023   Medicare Annual Wellness (AWV)  05/16/2024   Colonoscopy  04/13/2027   DTaP/Tdap/Td (2 - Td or Tdap) 05/24/2030   Pneumonia Vaccine 72+ Years old  Completed   HPV VACCINES  Aged Out   Meningococcal  B Vaccine  Aged Out    Health Maintenance  Health Maintenance Due  Topic Date Due   Hepatitis C Screening  Never done   Health Maintenance Items Addressed: Hepatitis C Screening due  Additional Screening:  Vision Screening: Recommended annual ophthalmology exams for early detection of glaucoma and other disorders of the eye.  Dental Screening: Recommended annual dental exams for proper oral hygiene  Community Resource Referral / Chronic Care Management: CRR required this visit?  No   CCM required this visit?  No     Plan:     I have personally reviewed and noted the following in the patient's chart:   Medical and social history Use of alcohol, tobacco or illicit drugs  Current medications and supplements including opioid prescriptions. Patient is not currently taking opioid prescriptions. Functional ability and status Nutritional status Physical activity Advanced directives List of other physicians Hospitalizations, surgeries, and ER visits in previous 12 months Vitals Screenings to include cognitive, depression, and falls Referrals  and appointments  In addition, I have reviewed and discussed with patient certain preventive protocols, quality metrics, and best practice recommendations. A written personalized care plan for preventive services as well as general preventive health recommendations were provided to patient.     Margette Sheldon, LPN   01/16/1094   After Visit Summary: (Mail) Due to this being a telephonic visit, the after visit summary with patients personalized plan was offered to patient via mail   Nurse Notes: Patient is due for Hepatitis C Screening. Hepatitis C screening was ordered during AWV.

## 2023-05-17 NOTE — Patient Instructions (Addendum)
 [name] , Thank you for taking time to come for your Medicare Wellness Visit. I appreciate your ongoing commitment to your health goals. Please review the following plan we discussed and let me know if I can assist you in the future.   Referrals/Orders/Follow-Ups/Clinician Recommendations: Hepatitis C screening was ordered for you during this visit.  This lab can be completed at your next appointment with Dr. Lincoln Renshaw in July.  Recommendations from Nurse Zaydee Aina: Aim for 30 minutes of exercise or brisk walking 5 days per week.  Drink 6-8 glasses of water  each day. Eat 5-6 servings of fresh vegetables and fruits per day. Continue to do brain exercises such as reading, puzzles, games on your phone to help keep the brain sharp and active.  This is a list of the screening recommended for you and due dates:  Health Maintenance  Topic Date Due   Hepatitis C Screening  Never done   COVID-19 Vaccine (3 - Pfizer risk series) 12/11/2023*   Zoster (Shingles) Vaccine (2 of 2) 05/23/2023   Flu Shot  08/12/2023   Medicare Annual Wellness Visit  05/16/2024   Colon Cancer Screening  04/13/2027   DTaP/Tdap/Td vaccine (2 - Td or Tdap) 05/24/2030   Pneumonia Vaccine  Completed   HPV Vaccine  Aged Out   Meningitis B Vaccine  Aged Out  *Topic was postponed. The date shown is not the original due date.    Advanced directives: (Declined) Advance directive discussed with you today. Even though you declined this today, please call our office should you change your mind, and we can give you the proper paperwork for you to fill out.  Next Medicare Annual Wellness Visit scheduled for next year: Yes, 05/22/2024 at 4:50 p.m. with Nurse Health Advisor  Have you seen your provider in the last 6 months (3 months if uncontrolled diabetes)? Yes.  Your next appointment with Dr. Lincoln Renshaw is 07/28/2023 at 10:50 a.m.

## 2023-05-19 ENCOUNTER — Other Ambulatory Visit: Payer: Self-pay

## 2023-05-23 ENCOUNTER — Other Ambulatory Visit (HOSPITAL_COMMUNITY): Payer: Self-pay

## 2023-05-27 ENCOUNTER — Other Ambulatory Visit: Payer: Self-pay

## 2023-06-03 ENCOUNTER — Other Ambulatory Visit: Payer: Self-pay

## 2023-06-10 ENCOUNTER — Other Ambulatory Visit: Payer: Self-pay

## 2023-06-12 DIAGNOSIS — I129 Hypertensive chronic kidney disease with stage 1 through stage 4 chronic kidney disease, or unspecified chronic kidney disease: Secondary | ICD-10-CM | POA: Diagnosis not present

## 2023-06-12 DIAGNOSIS — Z8249 Family history of ischemic heart disease and other diseases of the circulatory system: Secondary | ICD-10-CM | POA: Diagnosis not present

## 2023-06-12 DIAGNOSIS — I4891 Unspecified atrial fibrillation: Secondary | ICD-10-CM | POA: Diagnosis not present

## 2023-06-12 DIAGNOSIS — Z87891 Personal history of nicotine dependence: Secondary | ICD-10-CM | POA: Diagnosis not present

## 2023-06-12 DIAGNOSIS — N529 Male erectile dysfunction, unspecified: Secondary | ICD-10-CM | POA: Diagnosis not present

## 2023-06-12 DIAGNOSIS — M1990 Unspecified osteoarthritis, unspecified site: Secondary | ICD-10-CM | POA: Diagnosis not present

## 2023-06-12 DIAGNOSIS — G4733 Obstructive sleep apnea (adult) (pediatric): Secondary | ICD-10-CM | POA: Diagnosis not present

## 2023-06-12 DIAGNOSIS — E785 Hyperlipidemia, unspecified: Secondary | ICD-10-CM | POA: Diagnosis not present

## 2023-06-12 DIAGNOSIS — N400 Enlarged prostate without lower urinary tract symptoms: Secondary | ICD-10-CM | POA: Diagnosis not present

## 2023-06-12 DIAGNOSIS — D6869 Other thrombophilia: Secondary | ICD-10-CM | POA: Diagnosis not present

## 2023-06-12 DIAGNOSIS — N1831 Chronic kidney disease, stage 3a: Secondary | ICD-10-CM | POA: Diagnosis not present

## 2023-06-12 DIAGNOSIS — Z833 Family history of diabetes mellitus: Secondary | ICD-10-CM | POA: Diagnosis not present

## 2023-06-20 ENCOUNTER — Other Ambulatory Visit: Payer: Self-pay

## 2023-07-11 ENCOUNTER — Other Ambulatory Visit: Payer: Self-pay

## 2023-07-14 ENCOUNTER — Other Ambulatory Visit: Payer: Self-pay

## 2023-07-19 ENCOUNTER — Other Ambulatory Visit: Payer: Self-pay

## 2023-07-21 ENCOUNTER — Other Ambulatory Visit: Payer: Self-pay

## 2023-07-22 ENCOUNTER — Other Ambulatory Visit: Payer: Self-pay

## 2023-07-22 DIAGNOSIS — H269 Unspecified cataract: Secondary | ICD-10-CM | POA: Diagnosis not present

## 2023-07-22 DIAGNOSIS — H524 Presbyopia: Secondary | ICD-10-CM | POA: Diagnosis not present

## 2023-07-22 DIAGNOSIS — H52209 Unspecified astigmatism, unspecified eye: Secondary | ICD-10-CM | POA: Diagnosis not present

## 2023-07-22 DIAGNOSIS — H5213 Myopia, bilateral: Secondary | ICD-10-CM | POA: Diagnosis not present

## 2023-07-28 ENCOUNTER — Encounter: Payer: Self-pay | Admitting: Internal Medicine

## 2023-07-28 ENCOUNTER — Other Ambulatory Visit: Payer: Self-pay

## 2023-07-28 ENCOUNTER — Ambulatory Visit: Attending: Internal Medicine | Admitting: Internal Medicine

## 2023-07-28 VITALS — BP 143/82 | HR 66 | Temp 97.6°F | Ht 75.0 in | Wt 238.0 lb

## 2023-07-28 DIAGNOSIS — I480 Paroxysmal atrial fibrillation: Secondary | ICD-10-CM

## 2023-07-28 DIAGNOSIS — L309 Dermatitis, unspecified: Secondary | ICD-10-CM | POA: Diagnosis not present

## 2023-07-28 DIAGNOSIS — I129 Hypertensive chronic kidney disease with stage 1 through stage 4 chronic kidney disease, or unspecified chronic kidney disease: Secondary | ICD-10-CM

## 2023-07-28 DIAGNOSIS — I10 Essential (primary) hypertension: Secondary | ICD-10-CM

## 2023-07-28 DIAGNOSIS — Z85528 Personal history of other malignant neoplasm of kidney: Secondary | ICD-10-CM

## 2023-07-28 DIAGNOSIS — N1831 Chronic kidney disease, stage 3a: Secondary | ICD-10-CM

## 2023-07-28 DIAGNOSIS — G4733 Obstructive sleep apnea (adult) (pediatric): Secondary | ICD-10-CM | POA: Diagnosis not present

## 2023-07-28 DIAGNOSIS — Z23 Encounter for immunization: Secondary | ICD-10-CM

## 2023-07-28 MED ORDER — ZOSTER VAC RECOMB ADJUVANTED 50 MCG/0.5ML IM SUSR
0.5000 mL | Freq: Once | INTRAMUSCULAR | 0 refills | Status: AC
Start: 2023-07-28 — End: 2023-07-28

## 2023-07-28 MED ORDER — ZOSTER VAC RECOMB ADJUVANTED 50 MCG/0.5ML IM SUSR
0.5000 mL | Freq: Once | INTRAMUSCULAR | 0 refills | Status: AC
  Filled 2023-07-28: qty 0.5, 1d supply, fill #0

## 2023-07-28 MED ORDER — TRIAMCINOLONE ACETONIDE 0.1 % EX CREA
1.0000 | TOPICAL_CREAM | Freq: Two times a day (BID) | CUTANEOUS | 0 refills | Status: AC
  Filled 2023-07-28: qty 30, 15d supply, fill #0

## 2023-07-28 NOTE — Progress Notes (Signed)
 Patient ID: Albert Good., male    DOB: 1955/08/03  MRN: [id number]  CC: Hypertension (HTN f/u. Med refill. /No questions / concerns/Yes to shingles vax. )   Subjective: Albert Good is a 68 y.o. male who presents for chronic ds management. 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  Neg), aortic atherosclerosis on CT chest 09/2020    Discussed the use of AI scribe software for clinical note transcription with the patient, who gave verbal consent to proceed.  History of Present Illness Albert Good. is a 68 year old male with hypertension, atrial fibrillation, and chronic kidney disease who presents for follow-up.  He is here for follow-up on his blood pressure, atrial fibrillation, cholesterol, and chronic kidney disease.   PAF: He had issues with insurance coverage for Eliquis  but has resolved them and is back on Eliquis  twice daily. No bruising, bleeding, or heart racing while on Eliquis .  HTN: on Toprol  100 mg daily, Cardizem  300 mg daily, and hydralazine  50 mg three times a day and taking consistently.  Took his morning medications already for today.  He occasionally misses 2nd/midday dose of hydralazine , approximately two to three times a week, due to work distractions. He checks his blood pressure less frequently now as it has been stable, with a recent reading of 137/78 in April.  Does not use a lot of salt in his food.  He experiences occasional shortness of breath and heart palpitations but no chest pain or leg swelling.  CKD/history of renal cell CA right kidney: Regarding his  chronic kidney disease, he has a history of renal cell cancer with partial nephrectomy of the right kidney.  GFR's have been in the 40s.  He avoids NSAIDs to protect his remaining kidney function. He has not had recent blood work to check kidney function. He is under the care of a urologist and nephrology.  Last saw his nephrologist in February.  He mentioned that the plan on next visit is for repeat CAT scan of chest and abdomen for post renal cell CA monitoring  HL: He is on atorvastatin  for cholesterol management.  Reports compliance.    He uses triamcinolone  cream for a rash on his forehead and scalp that occurs when his hair grows, applying it once every few weeks as needed.  HM: He is due for his second Shingrix  vaccine    Patient Active Problem List   Diagnosis Date Noted   Benign neoplasm of ascending colon 04/13/2022   Hypertensive retinopathy 03/03/2022   Low testosterone in male 01/27/2022   Neoplasm of right kidney 12/22/2020   Aortic atherosclerosis (HCC) 11/27/2020   Renal mass 11/27/2020   Need for tetanus, diphtheria, and acellular pertussis (Tdap) vaccine 05/23/2020   Overweight (BMI 25.0-29.9) 01/24/2020   OSA (obstructive sleep apnea) 01/24/2020   History of adenomatous polyp of colon 01/24/2020   Chronic anticoagulation 03/28/2019   Paroxysmal atrial fibrillation (HCC) 03/12/2018   Essential hypertension 03/12/2018   BPH associated with nocturia 03/12/2018   Chronic gout of multiple sites 03/12/2018   Long-term use of aspirin  therapy 03/12/2018   Chest pain 03/27/2017     Current Outpatient Medications  on File Prior to Visit  Medication Sig Dispense Refill   allopurinol  (ZYLOPRIM ) 300 MG tablet Take 1 tablet (300 mg total) by mouth daily. 90 tablet 1   apixaban  (ELIQUIS ) 5 MG TABS tablet Take 1 tablet (5 mg total) by mouth 2 (two) times daily. 180 tablet 3   atorvastatin  (LIPITOR) 10 MG tablet Take 1 tablet (10 mg total) by mouth once daily. 90 tablet 3    colchicine  0.6 MG tablet Take 1 tablet (0.6 mg total) by mouth as needed for 3-4 days only during acute gout attack. 15 tablet 1   diltiazem  (CARDIZEM  CD) 300 MG 24 hr capsule Take 1 capsule (300 mg total) by mouth daily. 90 capsule 3   flecainide  (TAMBOCOR ) 50 MG tablet Take 1 tablet (50 mg total) by mouth daily. 90 tablet 3   hydrALAZINE  (APRESOLINE ) 50 MG tablet Take 1 tablet (50 mg total) by mouth 3 (three) times daily. 270 tablet 3   metoprolol  succinate (TOPROL -XL) 100 MG 24 hr tablet Take 1 tablet (100 mg total) by mouth daily. Take with or immediately following a meal. 90 tablet 3   tamsulosin  (FLOMAX ) 0.4 MG CAPS capsule Take 2 capsules (0.8 mg total) by mouth at bedtime. 180 capsule 1   No current facility-administered medications on file prior to visit.    Allergies  Allergen Reactions   Other Hives    HAIR DYE  Lactose intolerant    Social History   Socioeconomic History   Marital status: Single    Spouse name: Not on file   Number of children: 10   Years of education: Not on file   Highest education level: Not on file  Occupational History   Occupation: flagger  Tobacco Use   Smoking status: Former    Types: Cigarettes   Smokeless tobacco: Former    Types: Snuff    Quit date: 11/13/2020  Vaping Use   Vaping status: Never Used  Substance and Sexual Activity   Alcohol use: Yes    Comment: 2 every other day   Drug use: Not Currently   Sexual activity: Yes    Birth control/protection: None  Other Topics Concern   Not on file  Social History Narrative   Not on file   Social Drivers of Health   Financial Resource Strain: Medium Risk (07/24/2023)   Overall Financial Resource Strain (CARDIA)    Difficulty of Paying Living Expenses: Somewhat hard  Food Insecurity: Food Insecurity Present (07/24/2023)   Hunger Vital Sign    Worried About Running Out of Food in the Last Year: Sometimes true    Ran Out of Food in the Last Year: Sometimes true  Transportation  Needs: No Transportation Needs (07/24/2023)   PRAPARE - Administrator, Civil Service (Medical): No    Lack of Transportation (Non-Medical): No  Physical Activity: Sufficiently Active (07/24/2023)   Exercise Vital Sign    Days of Exercise per Week: 5 days    Minutes of Exercise per Session: 120 min  Stress: No Stress Concern Present (07/24/2023)   [employer] of Occupational Health - Occupational Stress Questionnaire    Feeling of Stress: Not at all  Social Connections: Moderately Isolated (07/24/2023)   Social Connection and Isolation Panel    Frequency of Communication with Friends and Family: Three times a week    Frequency of Social Gatherings with Friends and Family: Patient declined    Attends Religious Services: 1 to 4 times per year    Active Member  of Clubs or Organizations: No    Attends Banker Meetings: Not on file    Marital Status: Never married  Intimate Partner Violence: Not At Risk (05/17/2023)   Humiliation, Afraid, Rape, and Kick questionnaire    Fear of Current or Ex-Partner: No    Emotionally Abused: No    Physically Abused: No    Sexually Abused: No    Family History  Problem Relation Age of Onset   Diabetes Mother    Hypertension Father    Diabetes Sister    Diabetes Brother    Heart failure Brother    Kidney failure Brother    Colon cancer Neg Hx    Stomach cancer Neg Hx    Esophageal cancer Neg Hx     Past Surgical History:  Procedure Laterality Date   COLONOSCOPY WITH PROPOFOL  N/A 04/13/2022   Procedure: COLONOSCOPY WITH PROPOFOL ;  Surgeon: Legrand Victory LITTIE DOUGLAS, MD;  Location: [facility];  Service: Gastroenterology;  Laterality: N/A;   LIPOSUCTION     POLYPECTOMY  04/13/2022   Procedure: POLYPECTOMY;  Surgeon: Legrand Victory LITTIE DOUGLAS, MD;  Location: [facility];  Service: Gastroenterology;;   ROBOTIC ASSITED PARTIAL NEPHRECTOMY Right 12/22/2020   Procedure: XI ROBOTIC ASSITED  NEPHRECTOMY;  Surgeon: Renda Glance, MD;   Location: [facility];  Service: Urology;  Laterality: Right;    ROS: Review of Systems Negative except as stated above  PHYSICAL EXAM: BP (!) 143/82   Pulse 66   Temp 97.6 F (36.4 C) (Oral)   Ht 6' 3 (1.905 m)   Wt 238 lb (108 kg)   SpO2 97%   BMI 29.75 kg/m   Physical Exam   General appearance - alert, well appearing, and in no distress Mental status - normal mood, behavior, speech, dress, motor activity, and thought processes Chest - clear to auscultation, no wheezes, rales or rhonchi, symmetric air entry Heart - normal rate, regular rhythm, normal S1, S2, no murmurs, rubs, clicks or gallops Extremities - trace LE edema     Latest Ref Rng & Units 11/25/2022    1:31 PM 01/29/2022    3:49 PM 12/03/2021    3:20 PM  CMP  Glucose 70 - 99 mg/dL 93  75  884   BUN 8 - 27 mg/dL 17  17  18    Creatinine 0.76 - 1.27 mg/dL 8.43  8.42  7.91   Sodium 134 - 144 mmol/L 145  138  139   Potassium 3.5 - 5.2 mmol/L 4.3  4.6  3.8   Chloride 96 - 106 mmol/L 106  102  107   CO2 20 - 29 mmol/L 21  22  24    Calcium  8.6 - 10.2 mg/dL 9.1  9.2  8.6   Total Protein 6.0 - 8.5 g/dL 6.9     Total Bilirubin 0.0 - 1.2 mg/dL 0.4     Alkaline Phos 44 - 121 IU/L 95     AST 0 - 40 IU/L 23     ALT 0 - 44 IU/L 17      Lipid Panel     Component Value Date/Time   CHOL 142 11/25/2022 1331   TRIG 89 11/25/2022 1331   HDL 69 11/25/2022 1331   CHOLHDL 2.1 11/25/2022 1331   LDLCALC 56 11/25/2022 1331    CBC    Component Value Date/Time   WBC 5.2 11/25/2022 1331   WBC 5.2 12/03/2021 1409   RBC 5.00 11/25/2022 1331   RBC 4.91 12/03/2021 1409  HGB 13.8 11/25/2022 1331   HCT 43.9 11/25/2022 1331   PLT 213 11/25/2022 1331   MCV 88 11/25/2022 1331   MCH 27.6 11/25/2022 1331   MCH 28.5 12/03/2021 1409   MCHC 31.4 (L) 11/25/2022 1331   MCHC 32.5 12/03/2021 1409   RDW 14.3 11/25/2022 1331   LYMPHSABS 2.9 09/28/2018 1000   EOSABS 0.2 09/28/2018 1000   BASOSABS 0.1 09/28/2018 1000    ASSESSMENT AND  PLAN: 1. Essential hypertension (Primary) Blood pressure is not at goal.  Patient reports that he sometimes misses the second dose of his hydralazine  about 3 times a week.  He has set reminder on his phone and has a pillbox.  He wants to improve on this before we consider increasing the dose.  Advised to check blood pressure at least twice a week and record the readings.  Will have him follow-up with clinical pharmacist in 1 month. Continue hydralazine  50 mg 3 times a day, Toprol  100 mg daily and Cardizem  300 mg daily - Basic Metabolic Panel  2. Stage 3a chronic kidney disease (HCC) Last time.  Check GFR it was in the 40s.  We will recheck today.  Discussed the importance of good blood pressure control.  3. Paroxysmal atrial fibrillation (HCC) Followed by cardiology.  He is on Eliquis  and flecainide .  Also on metoprolol  and Cardizem .  Denies any significant palpitations  4. Dermatitis Refill given on triamcinolone  cream - triamcinolone  cream (KENALOG ) 0.1 %; Apply 1 Application topically 2 (two) times daily.  Dispense: 30 g; Refill: 0  5. Personal history of renal cell cancer It has been 6 months since her last visit with urology.  Advised patient to call and inquire from them whether he was supposed to follow-up in 6 months or 1 year.  He plans to do so  6. Need for shingles vaccine Printed prescription given for Shingrix  for him to take to the pharmacy - Zoster Vaccine Adjuvanted (SHINGRIX ) injection; Inject 0.5 mLs into the muscle once for 1 dose.  Dispense: 0.5 mL; Refill: 0  Patient was given the opportunity to ask questions.  Patient verbalized understanding of the plan and was able to repeat key elements of the plan.   This documentation was completed using Paediatric nurse.  Any transcriptional errors are unintentional.  Orders Placed This Encounter  Procedures   Basic Metabolic Panel     Requested Prescriptions   Signed Prescriptions Disp Refills    triamcinolone  cream (KENALOG ) 0.1 % 30 g 0    Sig: Apply 1 Application topically 2 (two) times daily.   Zoster Vaccine Adjuvanted (SHINGRIX ) injection 0.5 mL 0    Sig: Inject 0.5 mLs into the muscle once for 1 dose.    Return in about 4 months (around 11/28/2023) for 4 weeks with clinical pharmacist for BP recheck.  Barnie Louder, MD, FACP

## 2023-07-29 ENCOUNTER — Ambulatory Visit: Payer: Self-pay | Admitting: Internal Medicine

## 2023-07-29 LAB — BASIC METABOLIC PANEL WITH GFR
BUN/Creatinine Ratio: 10 (ref 10–24)
BUN: 14 mg/dL (ref 8–27)
CO2: 23 mmol/L (ref 20–29)
Calcium: 8.8 mg/dL (ref 8.6–10.2)
Chloride: 105 mmol/L (ref 96–106)
Creatinine, Ser: 1.45 mg/dL — ABNORMAL HIGH (ref 0.76–1.27)
Glucose: 87 mg/dL (ref 70–99)
Potassium: 3.8 mmol/L (ref 3.5–5.2)
Sodium: 143 mmol/L (ref 134–144)
eGFR: 52 mL/min/1.73 — ABNORMAL LOW (ref >59)

## 2023-08-02 ENCOUNTER — Other Ambulatory Visit: Payer: Self-pay

## 2023-08-05 ENCOUNTER — Other Ambulatory Visit: Payer: Self-pay

## 2023-08-05 DIAGNOSIS — M109 Gout, unspecified: Secondary | ICD-10-CM | POA: Diagnosis not present

## 2023-08-05 DIAGNOSIS — I4891 Unspecified atrial fibrillation: Secondary | ICD-10-CM | POA: Diagnosis not present

## 2023-08-05 DIAGNOSIS — I129 Hypertensive chronic kidney disease with stage 1 through stage 4 chronic kidney disease, or unspecified chronic kidney disease: Secondary | ICD-10-CM | POA: Diagnosis not present

## 2023-08-05 DIAGNOSIS — N400 Enlarged prostate without lower urinary tract symptoms: Secondary | ICD-10-CM | POA: Diagnosis not present

## 2023-08-05 DIAGNOSIS — G4733 Obstructive sleep apnea (adult) (pediatric): Secondary | ICD-10-CM | POA: Diagnosis not present

## 2023-08-05 DIAGNOSIS — N1832 Chronic kidney disease, stage 3b: Secondary | ICD-10-CM | POA: Diagnosis not present

## 2023-08-05 MED ORDER — JARDIANCE 10 MG PO TABS
10.0000 mg | ORAL_TABLET | Freq: Every day | ORAL | 6 refills | Status: DC
  Filled 2023-08-05: qty 30, 30d supply, fill #0
  Filled 2023-08-28: qty 30, 30d supply, fill #1
  Filled 2023-09-05 – 2023-09-27 (×2): qty 30, 30d supply, fill #2

## 2023-08-08 ENCOUNTER — Other Ambulatory Visit: Payer: Self-pay

## 2023-08-11 ENCOUNTER — Other Ambulatory Visit: Payer: Self-pay

## 2023-08-17 ENCOUNTER — Other Ambulatory Visit: Payer: Self-pay

## 2023-08-26 ENCOUNTER — Telehealth: Payer: Self-pay | Admitting: Internal Medicine

## 2023-08-26 NOTE — Telephone Encounter (Signed)
 Confirmed appt for 8/18

## 2023-08-29 ENCOUNTER — Other Ambulatory Visit: Payer: Self-pay

## 2023-08-29 ENCOUNTER — Ambulatory Visit: Admitting: Pharmacist

## 2023-08-29 NOTE — Telephone Encounter (Signed)
 Called patient verified name and date of birth. Patient appointment has been rescheduled.

## 2023-08-29 NOTE — Telephone Encounter (Signed)
 Copied from CRM #[id number]. Topic: Appointments - Appointment Cancel/Reschedule >> Aug 29, 2023  8:34 AM Edsel HERO wrote:  Patient called to reschedule appointment with [facility]. Patient would like to come on 8/19 if Herlene has anything available. Please advise.

## 2023-09-01 ENCOUNTER — Other Ambulatory Visit: Payer: Self-pay

## 2023-09-05 ENCOUNTER — Other Ambulatory Visit: Payer: Self-pay

## 2023-09-09 ENCOUNTER — Telehealth: Payer: Self-pay

## 2023-09-09 ENCOUNTER — Other Ambulatory Visit: Payer: Self-pay

## 2023-09-09 NOTE — Telephone Encounter (Signed)
 Good day Albert Good ,  Is there something we can do to help this patient with the cost of these medication. Thank you

## 2023-09-09 NOTE — Telephone Encounter (Signed)
 Copied from CRM [id number]. Topic: Clinical - Medical Advice >> Sep 09, 2023  1:52 PM Pinkey ORN wrote: Reason for CRM: apixaban  (ELIQUIS ) 5 MG TABS tablet + empagliflozin  (JARDIANCE ) 10 MG TABS tablet >> Sep 09, 2023  1:54 PM Pinkey ORN wrote: Patient states the copay amount for these medications are too expensive for him right now, therefor he needs to take a step back for awhile. He wants to know if he could take aspirin  in the meantime or is there something else that his insurance would cover. Please follow up with patient at [phone]

## 2023-09-09 NOTE — Telephone Encounter (Signed)
 Let patient know that aspirin  would not be sufficient in helping to prevent him from having a stroke as a result of the atrial fibrillation. He needs to be on stronger blood thinner like Eliquis , Xarelto or Coumadin.  If he is not able to afford the Eliquis , we would need to switch him to Coumadin also known as warfarin.  With the warfarin he would need to come in every several weeks to have levels checked. We went through this before with him in March. According to [facility], documentation  submitted for PAP for Eliquis  was denied. I have cc Herlene so that appt can be scheduled with him if pt wants to make the switch from Eliquis  to Coumadin.

## 2023-09-15 ENCOUNTER — Other Ambulatory Visit: Payer: Self-pay

## 2023-09-16 ENCOUNTER — Other Ambulatory Visit: Payer: Self-pay

## 2023-09-16 NOTE — Telephone Encounter (Signed)
 Spoke with patient was able to get patient discount cards for Eliquis  and Jardiance . Patient voiced that he was going to pick-up his medication today.

## 2023-09-19 ENCOUNTER — Other Ambulatory Visit: Payer: Self-pay

## 2023-09-23 ENCOUNTER — Encounter: Payer: Self-pay | Admitting: Internal Medicine

## 2023-09-27 ENCOUNTER — Other Ambulatory Visit: Payer: Self-pay

## 2023-09-29 ENCOUNTER — Other Ambulatory Visit: Payer: Self-pay

## 2023-09-29 MED ORDER — DAPAGLIFLOZIN PROPANEDIOL 10 MG PO TABS
10.0000 mg | ORAL_TABLET | Freq: Every day | ORAL | 3 refills | Status: AC
  Filled 2023-09-29 – 2023-09-30 (×3): qty 90, 90d supply, fill #0
  Filled 2023-12-25: qty 90, 90d supply, fill #1

## 2023-09-30 ENCOUNTER — Other Ambulatory Visit: Payer: Self-pay

## 2023-10-06 ENCOUNTER — Telehealth: Payer: Self-pay | Admitting: Internal Medicine

## 2023-10-06 NOTE — Telephone Encounter (Signed)
 Confirmed appt for 9/26

## 2023-10-07 ENCOUNTER — Encounter: Payer: Self-pay | Admitting: Pharmacist

## 2023-10-07 ENCOUNTER — Ambulatory Visit: Attending: Internal Medicine | Admitting: Pharmacist

## 2023-10-07 ENCOUNTER — Other Ambulatory Visit: Payer: Self-pay

## 2023-10-07 VITALS — BP 124/71 | HR 49

## 2023-10-07 DIAGNOSIS — I10 Essential (primary) hypertension: Secondary | ICD-10-CM

## 2023-10-07 NOTE — Progress Notes (Signed)
 S:     No chief complaint on file.  68 y.o. male who presents for hypertension evaluation, education, and management. 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 > Negative), aortic atherosclerosis on CT chest 09/2020.   Patient was referred by Primary Care Provider, Dr. Vicci, on 07/28/2023. BP at that visit was 143/82 mmHg. He was advised to return for a BP check with me. It was noted that his adherence to hydralazine  was suboptimal at that visit.   Today, patient arrives in good spirits and presents without assistance. Denies dizziness, headache, blurred vision, swelling. He has improved his adherence to hydralazine  50mg  TID and is tolerating this well. Uses an alarm on his phone for his middle daily dose. Has taken his BP medication this morning (takes diltizem, metoprolol  and AM dose of hydralazine  in the morning).   Family/Social history:  -Fhx: DM, HTN -Tobacco: former smoker (quit in 2022) -Alcohol: none reported   Medication adherence reported. Patient has taken BP medications today.  Current antihypertensives include: diltiazem  CD 300 mg daily (AM), hydralazine  50 mg TID, Toprol -XL 100 mg daily (AM)   Antihypertensives tried in the past include: irbesartan  (AKI), valsartan  (backorder at the time)  Reported home BP readings: -Has checked a couple of times since seeing Dr. Vicci 07/2023 -Unable to recall readings    Patient reported dietary habits:  -Compliant with sodium restriction  -Denies caffeine intake  -No changes since last visit.   Patient-reported exercise habits: none reported. No changes since last visit.   O:   Vitals:   10/07/23 1433  BP: 124/71  Pulse: (!) 49      Last 3 Office BP readings: BP Readings from Last 3 Encounters:  10/07/23 124/71  07/28/23 (!) 143/82  04/25/23 137/78   UACR 02/21/23: 52 mg/g  BMET    Component Value Date/Time   NA 143 07/28/2023 1210   K 3.8 07/28/2023 1210   CL 105 07/28/2023 1210   CO2 23 07/28/2023 1210   GLUCOSE 87 07/28/2023 1210   GLUCOSE 115 (H) 12/03/2021 1520   BUN 14 07/28/2023 1210   CREATININE 1.45 (H) 07/28/2023 1210   CALCIUM  8.8 07/28/2023 1210   GFRNONAA 34 (L) 12/03/2021 1520   GFRAA 69 08/30/2019 0943   Renal function: CrCl cannot be calculated (Patient's most recent lab result is older than the maximum 21 days allowed.).  Clinical ASCVD: aortic atherosclerosis noted on CT scan 2022 The 10-year ASCVD risk score (Arnett DK, et al., 2019) is: 14.5%   Values used to calculate the score:     Age: 71 years     Clincally relevant sex: Male     Is Non-Hispanic African American: Yes     Diabetic: No     Tobacco smoker: No     Systolic Blood Pressure: 124 mmHg     Is BP treated: Yes     HDL Cholesterol: 69 mg/dL     Total Cholesterol: 142 mg/dL   A/P: Hypertension longstanding and at goal <130/80 mmHg on current medications. No home readings to verify since his last visit, however, his medication adherence appears to be improving. -Continued hydralazine  50mg  TID.  -Continue diltiazem  and metoprolol  at current  doses.  -Counseled on lifestyle modifications for blood pressure control including reduced dietary sodium, staying hydrated ,increased exercise, adequate sleep. Patient is currently being worked up for OSA.  -Encouraged patient to check BP at home at least 2-3 times per week and bring log of readings to next visit. Counseled on proper use of home BP cuff.   Results reviewed and written information provided.    Written patient instructions provided. Patient verbalized understanding of treatment plan.  Total time in face  to face counseling 20 minutes.    Follow-up:  PCP 11/28/2023 Pharmacist prn.  Herlene Fleeta Morris, PharmD, JAQUELINE, CPP Clinical Pharmacist [facility] [phone]

## 2023-10-14 ENCOUNTER — Other Ambulatory Visit: Payer: Self-pay

## 2023-10-24 ENCOUNTER — Other Ambulatory Visit: Payer: Self-pay

## 2023-10-26 DIAGNOSIS — G4733 Obstructive sleep apnea (adult) (pediatric): Secondary | ICD-10-CM | POA: Diagnosis not present

## 2023-10-28 DIAGNOSIS — Z85528 Personal history of other malignant neoplasm of kidney: Secondary | ICD-10-CM | POA: Diagnosis not present

## 2023-10-28 DIAGNOSIS — Z905 Acquired absence of kidney: Secondary | ICD-10-CM | POA: Diagnosis not present

## 2023-10-28 DIAGNOSIS — J439 Emphysema, unspecified: Secondary | ICD-10-CM | POA: Diagnosis not present

## 2023-10-28 DIAGNOSIS — K5730 Diverticulosis of large intestine without perforation or abscess without bleeding: Secondary | ICD-10-CM | POA: Diagnosis not present

## 2023-10-28 DIAGNOSIS — N400 Enlarged prostate without lower urinary tract symptoms: Secondary | ICD-10-CM | POA: Diagnosis not present

## 2023-10-31 ENCOUNTER — Other Ambulatory Visit: Payer: Self-pay | Admitting: Internal Medicine

## 2023-10-31 ENCOUNTER — Other Ambulatory Visit: Payer: Self-pay

## 2023-10-31 DIAGNOSIS — N401 Enlarged prostate with lower urinary tract symptoms: Secondary | ICD-10-CM

## 2023-10-31 MED ORDER — TAMSULOSIN HCL 0.4 MG PO CAPS
0.8000 mg | ORAL_CAPSULE | Freq: Every day | ORAL | 1 refills | Status: AC
  Filled 2023-10-31: qty 180, 90d supply, fill #0
  Filled 2024-01-30: qty 180, 90d supply, fill #1

## 2023-11-03 ENCOUNTER — Other Ambulatory Visit: Payer: Self-pay

## 2023-11-11 DIAGNOSIS — R972 Elevated prostate specific antigen [PSA]: Secondary | ICD-10-CM | POA: Diagnosis not present

## 2023-11-11 DIAGNOSIS — R399 Unspecified symptoms and signs involving the genitourinary system: Secondary | ICD-10-CM | POA: Diagnosis not present

## 2023-11-11 DIAGNOSIS — Z85528 Personal history of other malignant neoplasm of kidney: Secondary | ICD-10-CM | POA: Diagnosis not present

## 2023-11-15 ENCOUNTER — Other Ambulatory Visit: Payer: Self-pay | Admitting: Physician Assistant

## 2023-11-15 ENCOUNTER — Other Ambulatory Visit: Payer: Self-pay | Admitting: Nurse Practitioner

## 2023-11-15 DIAGNOSIS — I480 Paroxysmal atrial fibrillation: Secondary | ICD-10-CM

## 2023-11-15 DIAGNOSIS — R972 Elevated prostate specific antigen [PSA]: Secondary | ICD-10-CM

## 2023-11-17 ENCOUNTER — Other Ambulatory Visit: Payer: Self-pay

## 2023-11-21 ENCOUNTER — Other Ambulatory Visit: Payer: Self-pay | Admitting: Physician Assistant

## 2023-11-21 DIAGNOSIS — I10 Essential (primary) hypertension: Secondary | ICD-10-CM

## 2023-11-21 DIAGNOSIS — I480 Paroxysmal atrial fibrillation: Secondary | ICD-10-CM

## 2023-11-23 ENCOUNTER — Other Ambulatory Visit: Payer: Self-pay | Admitting: Physician Assistant

## 2023-11-23 ENCOUNTER — Other Ambulatory Visit: Payer: Self-pay

## 2023-11-23 ENCOUNTER — Other Ambulatory Visit: Payer: Self-pay | Admitting: Internal Medicine

## 2023-11-23 DIAGNOSIS — I480 Paroxysmal atrial fibrillation: Secondary | ICD-10-CM

## 2023-11-23 DIAGNOSIS — M1A09X0 Idiopathic chronic gout, multiple sites, without tophus (tophi): Secondary | ICD-10-CM

## 2023-11-23 MED ORDER — METOPROLOL SUCCINATE ER 100 MG PO TB24
100.0000 mg | ORAL_TABLET | Freq: Every day | ORAL | 3 refills | Status: DC
  Filled 2023-11-23: qty 90, 90d supply, fill #0

## 2023-11-24 ENCOUNTER — Other Ambulatory Visit: Payer: Self-pay

## 2023-11-24 MED ORDER — ALLOPURINOL 300 MG PO TABS
300.0000 mg | ORAL_TABLET | Freq: Every day | ORAL | 1 refills | Status: AC
  Filled 2023-11-24: qty 90, 90d supply, fill #0

## 2023-11-28 ENCOUNTER — Other Ambulatory Visit: Payer: Self-pay

## 2023-11-28 ENCOUNTER — Ambulatory Visit: Payer: Self-pay | Attending: Internal Medicine | Admitting: Internal Medicine

## 2023-11-28 ENCOUNTER — Encounter: Payer: Self-pay | Admitting: Internal Medicine

## 2023-11-28 VITALS — BP 145/74 | HR 58 | Temp 97.6°F | Ht 75.0 in | Wt 242.0 lb

## 2023-11-28 DIAGNOSIS — I10 Essential (primary) hypertension: Secondary | ICD-10-CM

## 2023-11-28 DIAGNOSIS — N1831 Chronic kidney disease, stage 3a: Secondary | ICD-10-CM

## 2023-11-28 DIAGNOSIS — I129 Hypertensive chronic kidney disease with stage 1 through stage 4 chronic kidney disease, or unspecified chronic kidney disease: Secondary | ICD-10-CM

## 2023-11-28 DIAGNOSIS — I480 Paroxysmal atrial fibrillation: Secondary | ICD-10-CM

## 2023-11-28 DIAGNOSIS — R002 Palpitations: Secondary | ICD-10-CM | POA: Diagnosis not present

## 2023-11-28 DIAGNOSIS — Z23 Encounter for immunization: Secondary | ICD-10-CM

## 2023-11-28 MED ORDER — HYDRALAZINE HCL 50 MG PO TABS
75.0000 mg | ORAL_TABLET | Freq: Three times a day (TID) | ORAL | 3 refills | Status: AC
  Filled 2023-11-28 – 2023-12-07 (×2): qty 405, 90d supply, fill #0

## 2023-11-28 MED ORDER — APIXABAN 5 MG PO TABS
5.0000 mg | ORAL_TABLET | Freq: Two times a day (BID) | ORAL | 3 refills | Status: AC
  Filled 2023-11-28 – 2023-12-12 (×2): qty 180, 90d supply, fill #0

## 2023-11-28 MED ORDER — ATORVASTATIN CALCIUM 10 MG PO TABS
10.0000 mg | ORAL_TABLET | Freq: Every day | ORAL | 3 refills | Status: AC
  Filled 2023-11-28 – 2024-01-13 (×2): qty 90, 90d supply, fill #0

## 2023-11-28 MED ORDER — DILTIAZEM HCL ER COATED BEADS 300 MG PO CP24
300.0000 mg | ORAL_CAPSULE | Freq: Every day | ORAL | 3 refills | Status: AC
  Filled 2023-11-28 – 2023-12-16 (×2): qty 90, 90d supply, fill #0

## 2023-11-28 NOTE — Patient Instructions (Signed)
  VISIT SUMMARY: Today, we reviewed your chronic medical conditions, including hypertension, atrial fibrillation, and chronic kidney disease. We discussed your current medications, recent symptoms, and upcoming medical appointments.  YOUR PLAN: -ESSENTIAL HYPERTENSION: Hypertension means high blood pressure. Your blood pressure today was slightly elevated at 145/74 mmHg. We discussed the importance of limiting salt intake. Your hydralazine  dose has been increased to 75 mg three times a day, and we have sent an updated prescription for this medication. Refills for diltiazem , Eliquis , and atorvastatin  have also been sent.  -PAROXYSMAL ATRIAL FIBRILLATION WITH PALPITATIONS: Atrial fibrillation is an irregular and often rapid heart rate. You have been experiencing palpitations that last about an hour and resolve after eating. We have sent a message to your cardiologist for a refill of flecainide  and to schedule a follow-up appointment. It is important to follow up with your cardiologist to manage these symptoms and your medications.  -CHRONIC KIDNEY DISEASE, STAGE 3A: Chronic kidney disease means your kidneys are not working as well as they should. Your nephrologist recently changed your medication from Jardiance  to Farxiga . Continue to follow up with your nephrologist as needed.  INSTRUCTIONS: Please follow up with your cardiologist for your palpitations and medication management. You are also scheduled for an MRI of the prostate on December 12th. Continue to monitor your blood pressure at home and limit your salt intake.                      Contains text generated by [employer].                                 Contains text generated by [employer].

## 2023-11-28 NOTE — Progress Notes (Signed)
 Patient ID: Albert Kathol., male    DOB: 1955-04-10  MRN: [id number]  CC: Hypertension (HTN f/u. Med refills. Andra that are due are prescribed by cardiologist /Flu vax administered on 11/28/23 - C.A.)   Subjective: Albert Good is a 67 y.o. male who presents for chronic ds management. 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  Neg), aortic atherosclerosis on CT chest 09/2020    Discussed the use of AI scribe software for clinical note transcription with the patient, who gave verbal consent to proceed.  History of Present Illness   Albert Good. is a 68 year old male with hypertension, atrial fibrillation, and chronic kidney disease who presents for follow-up of his chronic medical conditions.  HTN: He manages hypertension with diltiazem  300 mg daily, metoprolol  XL 100 mg daily, and hydralazine  50 mg three times a day. Just took meds before coming to appt this a.m. Home BP reading last wk was in the 130s/70s, though today's reading was 145/74 mmHg. He does not actively limit salt intake, using seasoned salt and accent, but not much table salt. He continues atorvastatin . No chest pain, shortness of breath, or significant weight changes.  PAF: He experiences palpitations last 1 hr over the past 1-2 wks, particularly when he does not eat and engages in yard work, which resolve after eating. He wonders whether the palpitations are due to recent medication change from Jardiance  to Farxiga  but his nephrologist Dr. Gearline. He is currently taking flecainide  but is almost out and has requested a refill from  cardiology.  No brusing/bleeding on Eliquis . He has not experienced issues with feeling hot or cold.   CKD 3a: Recent GFR's have been in the low 50s to upper 40s with most recent 1 of 52.  Regarding his kidney function, he was last seen by his nephrologist about two months ago when his medication was changed to Farxiga .  He is scheduled for an MRI of the prostate on December 12th due to elevated PSA levels. A recent CT scan showed no signs of recurrence of his previous kidney cancer.       Patient Active Problem List   Diagnosis Date Noted   Benign neoplasm of ascending colon 04/13/2022   Hypertensive retinopathy 03/03/2022   Low testosterone in male 01/27/2022   Neoplasm of right kidney 12/22/2020   Aortic atherosclerosis 11/27/2020   Renal mass 11/27/2020   Need for tetanus, diphtheria, and acellular pertussis (Tdap) vaccine 05/23/2020   Overweight (BMI 25.0-29.9) 01/24/2020   OSA (obstructive sleep apnea) 01/24/2020   History of adenomatous polyp of colon 01/24/2020   Chronic anticoagulation 03/28/2019   Paroxysmal atrial fibrillation (HCC) 03/12/2018   Essential hypertension 03/12/2018   BPH associated with nocturia 03/12/2018   Chronic gout of multiple sites 03/12/2018   Long-term use of aspirin  therapy 03/12/2018   Chest pain 03/27/2017     Current Outpatient Medications on File Prior to Visit  Medication Sig Dispense Refill   allopurinol  (ZYLOPRIM ) 300 MG tablet Take 1 tablet (300 mg total) by mouth daily. 90 tablet 1   colchicine  0.6 MG tablet Take 1 tablet (0.6 mg total) by mouth as needed for  3-4 days only during acute gout attack. 15 tablet 1   dapagliflozin  propanediol (FARXIGA ) 10 MG TABS tablet Take 1 tablet (10 mg total) by mouth daily. 90 tablet 3   flecainide  (TAMBOCOR ) 50 MG tablet Take 1 tablet (50 mg total) by mouth daily. 90 tablet 3   metoprolol  succinate (TOPROL -XL) 100 MG 24 hr tablet Take 1 tablet (100 mg total) by mouth daily. Take with or immediately  following a meal. 90 tablet 3   tamsulosin  (FLOMAX ) 0.4 MG CAPS capsule Take 2 capsules (0.8 mg total) by mouth at bedtime. 180 capsule 1   triamcinolone  cream (KENALOG ) 0.1 % Apply 1 Application topically 2 (two) times daily. 30 g 0   No current facility-administered medications on file prior to visit.    Allergies  Allergen Reactions   Other Hives    HAIR DYE  Lactose intolerant    Social History   Socioeconomic History   Marital status: Single    Spouse name: Not on file   Number of children: 10   Years of education: Not on file   Highest education level: Not on file  Occupational History   Occupation: flagger  Tobacco Use   Smoking status: Former    Types: Cigarettes   Smokeless tobacco: Former    Types: Snuff    Quit date: 11/13/2020  Vaping Use   Vaping status: Never Used  Substance and Sexual Activity   Alcohol use: Yes    Comment: 2 every other day   Drug use: Not Currently   Sexual activity: Yes    Birth control/protection: None  Other Topics Concern   Not on file  Social History Narrative   Not on file   Social Drivers of Good   Financial Resource Strain: Medium Risk (07/24/2023)   Overall Financial Resource Strain (CARDIA)    Difficulty of Paying Living Expenses: Somewhat hard  Food Insecurity: Food Insecurity Present (07/24/2023)   Hunger Vital Sign    Worried About Running Out of Food in the Last Year: Sometimes true    Ran Out of Food in the Last Year: Sometimes true  Transportation Needs: No Transportation Needs (07/24/2023)   PRAPARE - Administrator, Civil Service (Medical): No    Lack of Transportation (Non-Medical): No  Physical Activity: Sufficiently Active (07/24/2023)   Exercise Vital Sign    Days of Exercise per Week: 5 days    Minutes of Exercise per Session: 120 min  Stress: No Stress Concern Present (07/24/2023)   Albert Good - Occupational Stress Questionnaire    Feeling of Stress: Not at all   Social Connections: Moderately Isolated (07/24/2023)   Social Connection and Isolation Panel    Frequency of Communication with Friends and Family: Three times a week    Frequency of Social Gatherings with Friends and Family: Patient declined    Attends Religious Services: 1 to 4 times per year    Active Member of [employer] or Organizations: No    Attends Engineer, Structural: Not on file    Marital Status: Never married  Intimate Partner Violence: Not At Risk (05/17/2023)   Humiliation, Afraid, Rape, and Kick questionnaire    Fear of Current or Ex-Partner: No    Emotionally Abused: No    Physically Abused: No    Sexually Abused: No    Family History  Problem Relation Age of Onset   Diabetes Mother    Hypertension Father    Diabetes Sister  Diabetes Brother    Heart failure Brother    Kidney failure Brother    Colon cancer Neg Hx    Stomach cancer Neg Hx    Esophageal cancer Neg Hx     Past Surgical History:  Procedure Laterality Date   COLONOSCOPY WITH PROPOFOL  N/A 04/13/2022   Procedure: COLONOSCOPY WITH PROPOFOL ;  Surgeon: Legrand Victory LITTIE DOUGLAS, MD;  Location: [facility];  Service: Gastroenterology;  Laterality: N/A;   LIPOSUCTION     POLYPECTOMY  04/13/2022   Procedure: POLYPECTOMY;  Surgeon: Legrand Victory LITTIE DOUGLAS, MD;  Location: [facility];  Service: Gastroenterology;;   ROBOTIC ASSITED PARTIAL NEPHRECTOMY Right 12/22/2020   Procedure: XI ROBOTIC ASSITED  NEPHRECTOMY;  Surgeon: Renda Glance, MD;  Location: [facility];  Service: Urology;  Laterality: Right;    ROS: Review of Systems Negative except as stated above  PHYSICAL EXAM: BP (!) 145/74 (BP Location: Left Arm, Patient Position: Sitting, Cuff Size: Large)   Pulse (!) 58   Temp 97.6 F (36.4 C) (Oral)   Ht 6' 3 (1.905 m)   Wt 242 lb (109.8 kg)   SpO2 96%   BMI 30.25 kg/m   Wt Readings from Last 3 Encounters:  11/28/23 242 lb (109.8 kg)  07/28/23 238 lb (108 kg)  05/17/23 225 lb (102.1 kg)     Physical Exam   General appearance - alert, well appearing, and in no distress Mental status - normal mood, behavior, speech, dress, motor activity, and thought processes Chest - clear to auscultation, no wheezes, rales or rhonchi, symmetric air entry Heart - Heart rate RRR, sounds to be in SR at this time Extremities - peripheral pulses normal, no pedal edema, no clubbing or cyanosis     Latest Ref Rng & Units 07/28/2023   12:10 PM 11/25/2022    1:31 PM 01/29/2022    3:49 PM  CMP  Glucose 70 - 99 mg/dL 87  93  75   BUN 8 - 27 mg/dL 14  17  17    Creatinine 0.76 - 1.27 mg/dL 8.54  8.43  8.42   Sodium 134 - 144 mmol/L 143  145  138   Potassium 3.5 - 5.2 mmol/L 3.8  4.3  4.6   Chloride 96 - 106 mmol/L 105  106  102   CO2 20 - 29 mmol/L 23  21  22    Calcium  8.6 - 10.2 mg/dL 8.8  9.1  9.2   Total Protein 6.0 - 8.5 g/dL  6.9    Total Bilirubin 0.0 - 1.2 mg/dL  0.4    Alkaline Phos 44 - 121 IU/L  95    AST 0 - 40 IU/L  23    ALT 0 - 44 IU/L  17     Lipid Panel     Component Value Date/Time   CHOL 142 11/25/2022 1331   TRIG 89 11/25/2022 1331   HDL 69 11/25/2022 1331   CHOLHDL 2.1 11/25/2022 1331   LDLCALC 56 11/25/2022 1331    CBC    Component Value Date/Time   WBC 5.2 11/25/2022 1331   WBC 5.2 12/03/2021 1409   RBC 5.00 11/25/2022 1331   RBC 4.91 12/03/2021 1409   HGB 13.8 11/25/2022 1331   HCT 43.9 11/25/2022 1331   PLT 213 11/25/2022 1331   MCV 88 11/25/2022 1331   MCH 27.6 11/25/2022 1331   MCH 28.5 12/03/2021 1409   MCHC 31.4 (L) 11/25/2022 1331   MCHC 32.5 12/03/2021 1409   RDW 14.3 11/25/2022  1331   LYMPHSABS 2.9 09/28/2018 1000   EOSABS 0.2 09/28/2018 1000   BASOSABS 0.1 09/28/2018 1000    ASSESSMENT AND PLAN: 1. Essential hypertension (Primary) Blood pressure not at goal with goal being 130/80 or lower.  Continue Cardizem  300 mg daily, metoprolol  100 mg daily.  Increase hydralazine  to 75 mg 3 times a day. Try to limit salt in the foods is much as  he can. - atorvastatin  (LIPITOR) 10 MG tablet; Take 1 tablet (10 mg total) by mouth once daily.  Dispense: 90 tablet; Refill: 3 - diltiazem  (CARDIZEM  CD) 300 MG 24 hr capsule; Take 1 capsule (300 mg total) by mouth daily.  Dispense: 90 capsule; Refill: 3 - Lipid panel - CBC - hydrALAZINE  (APRESOLINE ) 50 MG tablet; Take 1.5 tablets (75 mg total) by mouth 3 (three) times daily.  Dispense: 405 tablet; Refill: 3 - Comprehensive metabolic panel with GFR  2. Paroxysmal atrial fibrillation (HCC) Continue Eliquis , diltiazem , metoprolol .  He is needing refill on flecainide  which he gets through cardiology.  I have sent a message to his cardiologist informing of need for refill and for follow-up since he was last seen 1 year ago. - diltiazem  (CARDIZEM  CD) 300 MG 24 hr capsule; Take 1 capsule (300 mg total) by mouth daily.  Dispense: 90 capsule; Refill: 3 - apixaban  (ELIQUIS ) 5 MG TABS tablet; Take 1 tablet (5 mg total) by mouth 2 (two) times daily.  Dispense: 180 tablet; Refill: 3 - CBC - Ambulatory referral to Cardiology  3. Palpitations Patient reporting palpitations lasting up to 1 hour intermittently over the past week.  Will try to get him back in with cardiology.  Message sent to cardiology. - TSH+T4F+T3Free - Ambulatory referral to Cardiology  4. CKD stage 3a, GFR 45-59 ml/min (HCC) Stable.  Not on NSAIDs.  Continue Farxiga  as prescribed by nephrologist  5. Need for influenza vaccination Given today.     Patient was given the opportunity to ask questions.  Patient verbalized understanding of the plan and was able to repeat key elements of the plan.   This documentation was completed using Paediatric nurse.  Any transcriptional errors are unintentional.  Orders Placed This Encounter  Procedures   Flu vaccine HIGH DOSE PF(Fluzone Trivalent)   Lipid panel   CBC   TSH+T4F+T3Free   Comprehensive metabolic panel with GFR   Ambulatory referral to Cardiology      Requested Prescriptions   Signed Prescriptions Disp Refills   atorvastatin  (LIPITOR) 10 MG tablet 90 tablet 3    Sig: Take 1 tablet (10 mg total) by mouth once daily.   diltiazem  (CARDIZEM  CD) 300 MG 24 hr capsule 90 capsule 3    Sig: Take 1 capsule (300 mg total) by mouth daily.   apixaban  (ELIQUIS ) 5 MG TABS tablet 180 tablet 3    Sig: Take 1 tablet (5 mg total) by mouth 2 (two) times daily.   hydrALAZINE  (APRESOLINE ) 50 MG tablet 405 tablet 3    Sig: Take 1.5 tablets (75 mg total) by mouth 3 (three) times daily.    Return in about 4 months (around 03/27/2024).  Barnie Louder, MD, FACP

## 2023-11-29 ENCOUNTER — Ambulatory Visit: Payer: Self-pay | Admitting: Internal Medicine

## 2023-11-29 LAB — COMPREHENSIVE METABOLIC PANEL WITH GFR
ALT: 19 IU/L (ref 0–44)
AST: 26 IU/L (ref 0–40)
Albumin: 4.2 g/dL (ref 3.9–4.9)
Alkaline Phosphatase: 101 IU/L (ref 47–123)
BUN/Creatinine Ratio: 8 — ABNORMAL LOW (ref 10–24)
BUN: 13 mg/dL (ref 8–27)
Bilirubin Total: 0.4 mg/dL (ref 0.0–1.2)
CO2: 24 mmol/L (ref 20–29)
Calcium: 8.8 mg/dL (ref 8.6–10.2)
Chloride: 107 mmol/L — ABNORMAL HIGH (ref 96–106)
Creatinine, Ser: 1.57 mg/dL — ABNORMAL HIGH (ref 0.76–1.27)
Globulin, Total: 2.7 g/dL (ref 1.5–4.5)
Glucose: 88 mg/dL (ref 70–99)
Potassium: 4.2 mmol/L (ref 3.5–5.2)
Sodium: 143 mmol/L (ref 134–144)
Total Protein: 6.9 g/dL (ref 6.0–8.5)
eGFR: 47 mL/min/1.73 — ABNORMAL LOW (ref >59)

## 2023-11-29 LAB — TSH+T4F+T3FREE
Free T4: 1.2 ng/dL (ref 0.82–1.77)
T3, Free: 3 pg/mL (ref 2.0–4.4)
TSH: 1.48 u[IU]/mL (ref 0.450–4.500)

## 2023-11-29 LAB — LIPID PANEL
Chol/HDL Ratio: 1.7 ratio (ref 0.0–5.0)
Cholesterol, Total: 128 mg/dL (ref 100–199)
HDL: 75 mg/dL (ref >39)
LDL Chol Calc (NIH): 42 mg/dL (ref 0–99)
Triglycerides: 48 mg/dL (ref 0–149)
VLDL Cholesterol Cal: 11 mg/dL (ref 5–40)

## 2023-11-29 LAB — CBC
Hematocrit: 44.3 % (ref 37.5–51.0)
Hemoglobin: 14.2 g/dL (ref 13.0–17.7)
MCH: 27.5 pg (ref 26.6–33.0)
MCHC: 32.1 g/dL (ref 31.5–35.7)
MCV: 86 fL (ref 79–97)
Platelets: 186 x10E3/uL (ref 150–450)
RBC: 5.17 x10E6/uL (ref 4.14–5.80)
RDW: 14.4 % (ref 11.6–15.4)
WBC: 5.1 x10E3/uL (ref 3.4–10.8)

## 2023-11-30 ENCOUNTER — Other Ambulatory Visit: Payer: Self-pay | Admitting: Physician Assistant

## 2023-11-30 DIAGNOSIS — I480 Paroxysmal atrial fibrillation: Secondary | ICD-10-CM

## 2023-12-01 ENCOUNTER — Other Ambulatory Visit: Payer: Self-pay

## 2023-12-01 ENCOUNTER — Telehealth: Payer: Self-pay | Admitting: Cardiology

## 2023-12-01 MED ORDER — FLECAINIDE ACETATE 50 MG PO TABS
50.0000 mg | ORAL_TABLET | Freq: Every day | ORAL | 3 refills | Status: AC
  Filled 2023-12-01: qty 90, 90d supply, fill #0

## 2023-12-01 NOTE — Telephone Encounter (Signed)
 1. Which medications need to be refilled? (please list name of each medication and dose if known) flecainide  (TAMBOCOR ) 50 MG tablet      2. Would you like to learn more about the convenience, safety, & potential cost savings by using the [facility]? no     3. Are you open to using the [facility] (Type [facility]. no   4. Which pharmacy/location (including street and city if local pharmacy) is medication to be sent to?  [facility]       5. Do they need a 30 day or 90 day supply? 90 day  Pt completely out.

## 2023-12-02 ENCOUNTER — Other Ambulatory Visit: Payer: Self-pay

## 2023-12-02 ENCOUNTER — Telehealth: Payer: Self-pay

## 2023-12-02 ENCOUNTER — Ambulatory Visit: Attending: Internal Medicine

## 2023-12-02 DIAGNOSIS — R002 Palpitations: Secondary | ICD-10-CM

## 2023-12-02 NOTE — Telephone Encounter (Signed)
 Refill was sent 12/01/23.

## 2023-12-02 NOTE — Progress Notes (Unsigned)
 Enrolled patient for a 14 day Zio XT  monitor to be mailed to patients home

## 2023-12-02 NOTE — Telephone Encounter (Signed)
 Left message for patient informing him we will mail out heart monitor to be worn for 14 days.  Flecainide  refill sent in 12/01/23. Patient has appt with APP on 01/20/24.  Provided office number for callback if any questions.

## 2023-12-02 NOTE — Telephone Encounter (Signed)
-----   Message from Vina Gull sent at 11/28/2023  4:16 PM EST ----- Regarding: RE: Needs f/u and RF Thank you.   Yes, he needs to get in for follow up   We will call in flecanide until he is seen . Will set up for 3 wk monitor ----- Message ----- From: Vicci Barnie NOVAK, MD Sent: 11/28/2023  11:38 AM EST To: Vina Gull GAILS, MD; Orren LOISE Fabry, PA-C Subject: Needs f/u and RF                               I am the PCP for this patient.  I saw him today for routine follow-up.  He is needing refill on flecainide  which he takes for history of PAF and needs a follow-up appointment. Last saw you 1 yr ago. He has been experiencing some palpitations lasting about an hour over the past 1 to 2 weeks.  Patient states he called for refill on the med on Friday.

## 2023-12-07 ENCOUNTER — Other Ambulatory Visit: Payer: Self-pay

## 2023-12-12 ENCOUNTER — Other Ambulatory Visit: Payer: Self-pay

## 2023-12-16 ENCOUNTER — Other Ambulatory Visit: Payer: Self-pay

## 2023-12-21 ENCOUNTER — Other Ambulatory Visit: Payer: Self-pay

## 2023-12-23 ENCOUNTER — Ambulatory Visit
Admission: RE | Admit: 2023-12-23 | Discharge: 2023-12-23 | Disposition: A | Source: Ambulatory Visit | Attending: Nurse Practitioner

## 2023-12-23 DIAGNOSIS — R972 Elevated prostate specific antigen [PSA]: Secondary | ICD-10-CM

## 2023-12-23 MED ORDER — GADOPICLENOL 0.5 MMOL/ML IV SOLN
10.0000 mL | Freq: Once | INTRAVENOUS | Status: AC | PRN
  Administered 2023-12-23: 10 mL via INTRAVENOUS

## 2023-12-28 ENCOUNTER — Other Ambulatory Visit (HOSPITAL_COMMUNITY): Payer: Self-pay

## 2023-12-28 DIAGNOSIS — R002 Palpitations: Secondary | ICD-10-CM | POA: Diagnosis not present

## 2023-12-28 MED ORDER — DIAZEPAM 10 MG PO TABS
10.0000 mg | ORAL_TABLET | Freq: Once | ORAL | 0 refills | Status: AC
  Filled 2023-12-28: qty 1, 1d supply, fill #0

## 2023-12-28 MED ORDER — LEVOFLOXACIN 750 MG PO TABS
750.0000 mg | ORAL_TABLET | Freq: Once | ORAL | 0 refills | Status: AC
  Filled 2023-12-28: qty 1, 1d supply, fill #0

## 2023-12-29 DIAGNOSIS — R002 Palpitations: Secondary | ICD-10-CM

## 2024-01-02 ENCOUNTER — Other Ambulatory Visit: Payer: Self-pay

## 2024-01-03 ENCOUNTER — Ambulatory Visit: Payer: Self-pay | Admitting: Internal Medicine

## 2024-01-03 NOTE — Telephone Encounter (Signed)
 Went over results with the pt. He reports that he only takes Toprol  XL 100 mg once daily- He is asking if he can Half it= 50 mg daily.  Informed him that I would ask Dr Okey if this is ok. He verbalized understanding.

## 2024-01-04 ENCOUNTER — Other Ambulatory Visit: Payer: Self-pay

## 2024-01-04 MED ORDER — METOPROLOL SUCCINATE ER 50 MG PO TB24
50.0000 mg | ORAL_TABLET | Freq: Every day | ORAL | 3 refills | Status: AC
  Filled 2024-01-04: qty 90, 90d supply, fill #0

## 2024-01-09 ENCOUNTER — Other Ambulatory Visit: Payer: Self-pay

## 2024-01-13 ENCOUNTER — Other Ambulatory Visit: Payer: Self-pay

## 2024-01-16 ENCOUNTER — Other Ambulatory Visit: Payer: Self-pay

## 2024-01-19 NOTE — Progress Notes (Signed)
 "  Office Visit    Patient Name: Albert Good. Date of Encounter: 01/19/2024  PCP:  Vicci Barnie NOVAK, MD   [facility]  Cardiologist:  Vina Gull, MD  Advanced Practice Provider:  No care team member to display Electrophysiologist:  None   Chief Complaint    Albert Good. is a 69 y.o. male with a hx of PAF, HTN and OSA on CPAP Presents today for follow-up appointment. 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   He was seen by Dr. Wilbert Bihari a year ago for evaluation of OSA.  He was doing well with his CPAP device at that time.  He tolerated a full mask and felt adequate pressure.  He has had no significant daytime sleepiness as of that appointment.  He was seen by me 06/29/2021, he states he has been doing pretty well.  He had his right kidney removed back in December for cystic clear-cell carcinoma.  He was started on statin therapy at that time for hyperlipidemia.  He also has a history of atrial fibrillation and is anticoagulated with Eliquis .  He is sinus bradycardia with rate in the 50s.  However, he is asymptomatic.  His blood pressure is well controlled today.  He has not had any lower extremity edema.  He has been compliant with his CPAP for his sleep apnea.  He retired but then went back to work and works over 50 hours a week.  We discussed increasing cardiovascular exercise to daily and he is interested in returning to the gym.   He was seen by Dr. Bihari a few months later (09/20/22)  to review PAP device and sleep apnea symptoms.  I saw him 11/24, he presents with a history of hypertension, atrial fibrillation, and sleep apnea, presents for a routine follow-up. He reports good adherence to his medication regimen, which includes hydralazine  10mg  twice daily, metoprolol  100mg  daily, Cardizem  300mg  daily, Eliquis  5mg  twice daily, and Lipitor 10mg  daily. He denies any recent chest pain or shortness of breath. He has  been using a CPAP device for sleep apnea, which he reports is going well.  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 He has not had any issues with atrial fibrillation for several years.  The patient has experienced significant family loss in recent years, with four of his seven siblings passing away, three from heart-related conditions and one from cancer. He is the oldest male sibling remaining.  The patient is due to see his primary care provider next week. He has not had any lab work done this year.  Reports no shortness of breath nor dyspnea on exertion. Reports no chest pain, pressure, or tightness. No edema, orthopnea, PND. Reports no palpitations.   Today, he presents with a hx of hypertension and atrial fibrillation for cardiovascular follow-up.  He notes intermittent palpitations that he associates with higher blood pressure. Recent home and office blood pressures have ranged from 124/71 to 146/92 mmHg, with prior systolic values in the 200s. He takes metoprolol , recently reduced from 100 mg to 50 mg for low heart rate, along with Cardizem  300 mg and hydralazine  75 mg three times daily, which was increased before the metoprolol  adjustment.  He reports no atrial fibrillation symptoms for years. He is taking flecainide , diltiazem , and metoprolol . His current heart rate is 66 beats  per minute.  He noticed very slight swelling in one leg beginning a few weeks to a month ago. It is nonpainful and not related to injury. He denies joint pain or arthritis in that leg and notes only occasional hip pain with overexertion.  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  Reports no shortness of breath nor dyspnea on exertion. Reports no chest pain, pressure, or tightness. No orthopnea, PND. Reports no palpitations.   Discussed the use of AI scribe software for clinical note transcription  with the patient, who gave verbal consent to proceed.   Past Medical History    Past Medical History:  Diagnosis Date   BPH associated with nocturia 03/12/2018   BPH with obstruction/lower urinary tract symptoms    Chronic gout of multiple sites 03/12/2018   Dysrhythmia    Afib   Essential hypertension    Gout    Long-term use of aspirin  therapy 03/12/2018   OSA (obstructive sleep apnea)    uses Cpap   Paroxysmal atrial fibrillation [facility])    Past Surgical History:  Procedure Laterality Date   COLONOSCOPY WITH PROPOFOL  N/A 04/13/2022   Procedure: COLONOSCOPY WITH PROPOFOL ;  Surgeon: Legrand Victory LITTIE DOUGLAS, MD;  Location: [facility];  Service: Gastroenterology;  Laterality: N/A;   LIPOSUCTION     POLYPECTOMY  04/13/2022   Procedure: POLYPECTOMY;  Surgeon: Legrand Victory LITTIE DOUGLAS, MD;  Location: [facility];  Service: Gastroenterology;;   ROBOTIC ASSITED PARTIAL NEPHRECTOMY Right 12/22/2020   Procedure: XI ROBOTIC ASSITED  NEPHRECTOMY;  Surgeon: Renda Glance, MD;  Location: [facility];  Service: Urology;  Laterality: Right;    Allergies  Allergies  Allergen Reactions   Other Hives    HAIR DYE  Lactose intolerant    EKGs/Labs/Other Studies Reviewed:   The following studies were reviewed today:  Long-term monitor 4/22  Patch Wear Time:  14 days and 0 hours   SInus rhythm  Rates 45 to 154 bpm  Average HR 68 bpm  Short burst SVT (? Atrial tach) Longest 13 beats  Not sensed.  Rare PVC, couple No pauses NO triggered events  EKG:  EKG is  ordered today.  The ekg ordered today demonstrates sinus bradycardia.  Recent Labs: 11/28/2023: ALT 19; BUN 13; Creatinine, Ser 1.57; Hemoglobin 14.2; Platelets 186; Potassium 4.2; Sodium 143; TSH 1.480  Recent Lipid Panel    Component Value Date/Time   CHOL 128 11/28/2023 1209   TRIG 48 11/28/2023 1209   HDL 75 11/28/2023 1209   CHOLHDL 1.7 11/28/2023 1209   LDLCALC 42 11/28/2023 1209     Home Medications   No outpatient medications  have been marked as taking for the 01/20/24 encounter (Appointment) with Lucien Orren SAILOR, PA-C.     Review of Systems      All other systems reviewed and are otherwise negative except as noted above.  Physical Exam    VS:  There were no vitals taken for this visit. , BMI There is no height or weight on file to calculate BMI.  Wt Readings from Last 3 Encounters:  11/28/23 242 lb (109.8 kg)  07/28/23 238 lb (108 kg)  05/17/23 225 lb (102.1 kg)     GEN: Well nourished, well developed, in no acute distress. HEENT: normal. Neck: Supple, no JVD, carotid bruits, or masses. Cardiac: sinus bradycardia, no murmurs, rubs, or gallops. No clubbing, cyanosis, edema.  Radials/PT 2+ and equal bilaterally.  Respiratory:  Respirations regular and unlabored, clear to auscultation  bilaterally. GI: Soft, nontender, nondistended. MS: No deformity or atrophy. Skin: Warm and dry, no rash. Neuro:  Strength and sensation are intact. Psych: Normal affect.  Assessment & Plan    Paroxysmal atrial fibrillation Well-controlled with flecainide , diltiazem , and metoprolol . Normal sinus rhythm, heart rate 66 bpm on EKG. - Continue flecainide , diltiazem , and metoprolol .  Hypertension Blood pressure readings show variability with recent improvement from previous levels in the 200s systolic. Recent metoprolol  adjustment due to low heart rate. - Continue metoprolol , diltiazem , and hydralazine . - Monitor blood pressure for 1-2 weeks. - Consider increasing hydralazine  if blood pressure rises to 140s-150s/90s.  Hyperlipidemia Well-controlled with triglycerides at 48, LDL at 42, HDL at 75. - Continue current management and monitoring.  Unilateral lower extremity edema Mild edema with slight puffiness, low suspicion for blood clot due to lack of pain or significant symptoms. - Monitor for worsening edema or pain. - Consider ultrasound if symptoms worsen.  Disposition: Follow up 1 year with Vina Gull, MD or  APP.  Signed, Orren LOISE Fabry, PA-C 01/19/2024, 9:28 AM [facility]  "

## 2024-01-20 ENCOUNTER — Ambulatory Visit: Attending: Physician Assistant | Admitting: Physician Assistant

## 2024-01-20 ENCOUNTER — Encounter: Payer: Self-pay | Admitting: Physician Assistant

## 2024-01-20 VITALS — BP 146/92 | HR 68 | Ht 74.0 in | Wt 245.0 lb

## 2024-01-20 DIAGNOSIS — E785 Hyperlipidemia, unspecified: Secondary | ICD-10-CM | POA: Diagnosis not present

## 2024-01-20 DIAGNOSIS — I480 Paroxysmal atrial fibrillation: Secondary | ICD-10-CM

## 2024-01-20 DIAGNOSIS — R002 Palpitations: Secondary | ICD-10-CM | POA: Diagnosis not present

## 2024-01-20 DIAGNOSIS — G4733 Obstructive sleep apnea (adult) (pediatric): Secondary | ICD-10-CM | POA: Diagnosis not present

## 2024-01-20 NOTE — Patient Instructions (Signed)
 Medication Instructions:  Your physician recommends that you continue on your current medications as directed. Please refer to the Current Medication list given to you today. *If you need a refill on your cardiac medications before your next appointment, please call your pharmacy*  Lab Work: None ordered If you have labs (blood work) drawn today and your tests are completely normal, you will receive your results only by: MyChart Message (if you have MyChart) OR A paper copy in the mail If you have any lab test that is abnormal or we need to change your treatment, we will call you to review the results.  Testing/Procedures: None ordered  Follow-Up: At [facility], you and your health needs are our priority.  As part of our continuing mission to provide you with exceptional heart care, our providers are all part of one team.  This team includes your primary Cardiologist (physician) and Advanced Practice Providers or APPs (Physician Assistants and Nurse Practitioners) who all work together to provide you with the care you need, when you need it.  Your next appointment:   1 year(s)  Provider:   Vina Gull, MD or Orren Fabry, PA   We recommend signing up for the patient portal called MyChart.  Sign up information is provided on this After Visit Summary.  MyChart is used to connect with patients for Virtual Visits (Telemedicine).  Patients are able to view lab/test results, encounter notes, upcoming appointments, etc.  Non-urgent messages can be sent to your provider as well.   To learn more about what you can do with MyChart, go to [email or web address].   Other Instructions Please check your blood pressure daily for two weeks, then contact the office with your readings  Please contact the office with your readings either by phone, by dropping it off in person, or by sending it through MyChart.   Be sure to check your blood pressure one to two hours after taking your  medications.  Avoid the following for 30 minutes before checking your blood pressure: No caffeine No alcohol No eating No smoking  No exercise  Five minutes before checking your blood pressure: Use the restroom Sit up straight in a chair with your back supported and feet flat on the floor Remain quiet and do not talk

## 2024-01-30 ENCOUNTER — Other Ambulatory Visit: Payer: Self-pay

## 2024-02-03 ENCOUNTER — Other Ambulatory Visit: Payer: Self-pay

## 2024-02-03 MED ORDER — FUROSEMIDE 20 MG PO TABS
20.0000 mg | ORAL_TABLET | Freq: Every day | ORAL | 1 refills | Status: AC | PRN
  Filled 2024-02-03: qty 30, 30d supply, fill #0

## 2024-02-06 ENCOUNTER — Other Ambulatory Visit: Payer: Self-pay

## 2024-02-07 ENCOUNTER — Telehealth: Payer: Self-pay

## 2024-02-07 NOTE — Telephone Encounter (Signed)
"  ° °  Pre-operative Risk Assessment    Patient Name: Albert Good.  DOB: [birth year] MRN: [id number]   Date of last office visit: 01/20/24 Date of next office visit: Not scheduled   Request for Surgical Clearance    Procedure:  MRI/US  Fusion Bx  Date of Surgery:  Clearance TBD                                Surgeon:  Dr. Renda Socks Group or Practice Name:  [facility] Phone number:  [phone] Fax number:  [phone]   Type of Clearance Requested:   - Medical  - Pharmacy:  Hold Apixaban  (Eliquis ) x 3 days   Type of Anesthesia:  Not Indicated   Additional requests/questions:    Bonney Ival LOISE Gerome   02/07/2024, 3:42 PM   "

## 2024-02-08 NOTE — Telephone Encounter (Signed)
" ° °  Patient Name: Albert Good.  DOB: August 24, 1955 MRN: [id number]  Primary Cardiologist: Vina Gull, MD  When I saw him earlier this month he was overall doing well from a cardiovascular standpoint.  As long as his blood pressure has been well-controlled he is overall low risk to undergo upcoming biopsy without any further cardiovascular testing.  Pharmacy to weigh in on Eliquis  hold.  Will route this bundled recommendation to requesting provider via Epic fax function and remove from pre-op pool. Please call with questions.  Orren LOISE Fabry, PA-C 02/08/2024, 1:00 PM  "

## 2024-02-08 NOTE — Telephone Encounter (Signed)
 I s/w the pt to follow up on BP since our last appt 01/20/24 with Orren Fabry, PAC.   Pt said his BP yesterday was Left arm 148/90 He did not take today 02/08/24. I asked the pt if he will take his BP around 8 tonight and I will call him tomorrow and f/u. Pt thanked me for our help and looking for him

## 2024-02-08 NOTE — Telephone Encounter (Signed)
 Patient with diagnosis of atrial fibrilation  on Eliquis  for anticoagulation.    Procedure: MRI/US  Fusion Bx  Date of procedure: TBD   CHA2DS2-VASc Score = 2   This indicates a 2.2% annual risk of stroke. The patient's score is based upon: CHF History: 0 HTN History: 1 Diabetes History: 0 Stroke History: 0 Vascular Disease History: 0 Age Score: 1 Gender Score: 0   CrCl 58 mL/min Platelet count 186 K   Patient has not had an Afib/aflutter ablation in the last 3 months, DCCV within the last 4 weeks or a watchman implanted in the last 45 days     Per office protocol, patient can hold Eliquis  for 3 days prior to procedure.   Patient will not need bridging with Lovenox (enoxaparin) around procedure.  **This guidance is not considered finalized until pre-operative APP has relayed final recommendations.**

## 2024-02-08 NOTE — Telephone Encounter (Addendum)
 Please see Albert Good's note below. Can we call and check to see how patient's BP has been doing since he last saw Albert Good? Per Orren, patient is at acceptable risk for surgery as long as BP has been well controlled.   Thank you!

## 2024-02-08 NOTE — Telephone Encounter (Signed)
 Hi Tessa, you recently saw this patient in clinic on 01/20/2024. Are you able to comment on surgical clearance for upcoming MRI/US  fusion biopsy? Please route your response to P CV DIV PREOP.  Pharmacy, can you please provide recommendations for holding Eliquis ?  Thank you both! Anira Senegal

## 2024-02-09 NOTE — Telephone Encounter (Signed)
" °  ° °  Primary Cardiologist: Vina Gull, MD  Chart reviewed as part of pre-operative protocol coverage. Given past medical history and time since last visit, based on ACC/AHA guidelines, Albert Good. would be at acceptable risk for the planned procedure without further cardiovascular testing.   Patient has not had an Afib/aflutter ablation in the last 3 months, DCCV within the last 4 weeks or a watchman implanted in the last 45 days    Per office protocol, patient can hold Eliquis  for 3 days prior to procedure.   Patient will not need bridging with Lovenox (enoxaparin) around procedure.  I will route this recommendation to the requesting party via Epic fax function and remove from pre-op pool.  Please call with questions.  [name] Akil Hoos NP-C     02/09/2024, 11:14 AM [facility] [address] [place], [place] [address] Office [phone]      "

## 2024-02-09 NOTE — Telephone Encounter (Signed)
 I s/w the pt and asked if he was able to get BP reading last night as I requested. Pt answered yes he took his BP x 2.   Reading #1 around 8 pm was 137/78 Reading # 2 around 9:30 ish was 133/77  I assured the pt that I will get this to the preop APP for further review. Pt said thank you for the help.

## 2024-03-27 ENCOUNTER — Ambulatory Visit: Admitting: Internal Medicine

## 2024-05-22 ENCOUNTER — Ambulatory Visit
